# Patient Record
Sex: Female | Born: 1950
Health system: Southern US, Community
[De-identification: ages and names within clinical notes are randomized; demographics above are authoritative.]

## PROBLEM LIST (undated history)

## (undated) DIAGNOSIS — F329 Major depressive disorder, single episode, unspecified: Secondary | ICD-10-CM

## (undated) DIAGNOSIS — T7840XA Allergy, unspecified, initial encounter: Secondary | ICD-10-CM

## (undated) DIAGNOSIS — I7300 Raynaud's syndrome without gangrene: Secondary | ICD-10-CM

## (undated) DIAGNOSIS — M5136 Other intervertebral disc degeneration, lumbar region: Secondary | ICD-10-CM

## (undated) DIAGNOSIS — M8580 Other specified disorders of bone density and structure, unspecified site: Secondary | ICD-10-CM

## (undated) DIAGNOSIS — I10 Essential (primary) hypertension: Secondary | ICD-10-CM

## (undated) DIAGNOSIS — E785 Hyperlipidemia, unspecified: Secondary | ICD-10-CM

## (undated) DIAGNOSIS — F32A Depression, unspecified: Secondary | ICD-10-CM

## (undated) DIAGNOSIS — M51369 Other intervertebral disc degeneration, lumbar region without mention of lumbar back pain or lower extremity pain: Secondary | ICD-10-CM

## (undated) HISTORY — DX: Other intervertebral disc degeneration, lumbar region without mention of lumbar back pain or lower extremity pain: M51.369

## (undated) HISTORY — DX: Raynaud's syndrome without gangrene: I73.00

## (undated) HISTORY — DX: Depression, unspecified: F32.A

## (undated) HISTORY — DX: Allergy, unspecified, initial encounter: T78.40XA

## (undated) HISTORY — DX: Essential (primary) hypertension: I10

## (undated) HISTORY — DX: Other intervertebral disc degeneration, lumbar region: M51.36

## (undated) HISTORY — DX: Major depressive disorder, single episode, unspecified: F32.9

## (undated) HISTORY — DX: Hyperlipidemia, unspecified: E78.5

## (undated) HISTORY — PX: ABDOMINAL HYSTERECTOMY: SHX81

## (undated) HISTORY — PX: RHINOPLASTY: SUR1284

## (undated) HISTORY — DX: Other specified disorders of bone density and structure, unspecified site: M85.80

---

## 2000-07-20 ENCOUNTER — Emergency Department (HOSPITAL_COMMUNITY): Admission: EM | Admit: 2000-07-20 | Discharge: 2000-07-20 | Payer: Self-pay | Admitting: Emergency Medicine

## 2001-02-01 ENCOUNTER — Other Ambulatory Visit: Admission: RE | Admit: 2001-02-01 | Discharge: 2001-02-01 | Payer: Self-pay | Admitting: Obstetrics & Gynecology

## 2002-01-31 ENCOUNTER — Other Ambulatory Visit: Admission: RE | Admit: 2002-01-31 | Discharge: 2002-01-31 | Payer: Self-pay | Admitting: Internal Medicine

## 2004-05-13 ENCOUNTER — Other Ambulatory Visit: Admission: RE | Admit: 2004-05-13 | Discharge: 2004-05-13 | Payer: Self-pay | Admitting: Internal Medicine

## 2006-06-22 ENCOUNTER — Other Ambulatory Visit: Admission: RE | Admit: 2006-06-22 | Discharge: 2006-06-22 | Payer: Self-pay | Admitting: Internal Medicine

## 2008-06-05 DIAGNOSIS — I7300 Raynaud's syndrome without gangrene: Secondary | ICD-10-CM

## 2008-06-05 HISTORY — DX: Raynaud's syndrome without gangrene: I73.00

## 2008-08-28 ENCOUNTER — Ambulatory Visit (HOSPITAL_COMMUNITY): Admission: RE | Admit: 2008-08-28 | Discharge: 2008-08-28 | Payer: Self-pay | Admitting: Internal Medicine

## 2009-09-02 ENCOUNTER — Other Ambulatory Visit: Admission: RE | Admit: 2009-09-02 | Discharge: 2009-09-02 | Payer: Self-pay | Admitting: Internal Medicine

## 2010-09-29 ENCOUNTER — Other Ambulatory Visit: Payer: Self-pay | Admitting: Internal Medicine

## 2010-09-29 DIAGNOSIS — Z1231 Encounter for screening mammogram for malignant neoplasm of breast: Secondary | ICD-10-CM

## 2010-10-07 ENCOUNTER — Ambulatory Visit
Admission: RE | Admit: 2010-10-07 | Discharge: 2010-10-07 | Disposition: A | Discharge status: Home / Self Care | Payer: Commercial Indemnity | Source: Ambulatory Visit | Attending: Internal Medicine | Admitting: Internal Medicine

## 2010-10-07 DIAGNOSIS — Z1231 Encounter for screening mammogram for malignant neoplasm of breast: Secondary | ICD-10-CM

## 2011-09-29 ENCOUNTER — Other Ambulatory Visit: Payer: Self-pay | Admitting: Physician Assistant

## 2011-09-29 DIAGNOSIS — M8580 Other specified disorders of bone density and structure, unspecified site: Secondary | ICD-10-CM

## 2011-09-29 DIAGNOSIS — Z1231 Encounter for screening mammogram for malignant neoplasm of breast: Secondary | ICD-10-CM

## 2011-10-27 ENCOUNTER — Ambulatory Visit
Admission: RE | Admit: 2011-10-27 | Discharge: 2011-10-27 | Disposition: A | Discharge status: Home / Self Care | Payer: Commercial Indemnity | Source: Ambulatory Visit | Attending: Physician Assistant | Admitting: Physician Assistant

## 2011-10-27 DIAGNOSIS — M8580 Other specified disorders of bone density and structure, unspecified site: Secondary | ICD-10-CM

## 2011-10-27 DIAGNOSIS — Z1231 Encounter for screening mammogram for malignant neoplasm of breast: Secondary | ICD-10-CM

## 2012-10-11 ENCOUNTER — Other Ambulatory Visit (HOSPITAL_COMMUNITY)
Admission: RE | Admit: 2012-10-11 | Discharge: 2012-10-11 | Disposition: A | Discharge status: Home / Self Care | Payer: Commercial Indemnity | Source: Ambulatory Visit | Attending: Internal Medicine | Admitting: Internal Medicine

## 2012-10-11 DIAGNOSIS — Z01419 Encounter for gynecological examination (general) (routine) without abnormal findings: Secondary | ICD-10-CM | POA: Insufficient documentation

## 2012-10-11 DIAGNOSIS — N760 Acute vaginitis: Secondary | ICD-10-CM | POA: Insufficient documentation

## 2012-10-11 DIAGNOSIS — Z1151 Encounter for screening for human papillomavirus (HPV): Secondary | ICD-10-CM | POA: Insufficient documentation

## 2012-11-01 ENCOUNTER — Other Ambulatory Visit: Payer: Self-pay

## 2012-11-01 DIAGNOSIS — Z1231 Encounter for screening mammogram for malignant neoplasm of breast: Secondary | ICD-10-CM

## 2012-12-13 ENCOUNTER — Ambulatory Visit
Admission: RE | Admit: 2012-12-13 | Discharge: 2012-12-13 | Disposition: A | Discharge status: Home / Self Care | Payer: Managed Care, Other (non HMO) | Source: Ambulatory Visit

## 2012-12-13 DIAGNOSIS — Z1231 Encounter for screening mammogram for malignant neoplasm of breast: Secondary | ICD-10-CM

## 2012-12-16 ENCOUNTER — Other Ambulatory Visit: Payer: Self-pay | Admitting: Physician Assistant

## 2012-12-16 DIAGNOSIS — R928 Other abnormal and inconclusive findings on diagnostic imaging of breast: Secondary | ICD-10-CM

## 2012-12-26 ENCOUNTER — Ambulatory Visit
Admission: RE | Admit: 2012-12-26 | Discharge: 2012-12-26 | Disposition: A | Discharge status: Home / Self Care | Payer: Managed Care, Other (non HMO) | Source: Ambulatory Visit | Attending: Physician Assistant | Admitting: Physician Assistant

## 2012-12-26 DIAGNOSIS — R928 Other abnormal and inconclusive findings on diagnostic imaging of breast: Secondary | ICD-10-CM

## 2013-04-18 ENCOUNTER — Ambulatory Visit: Payer: Self-pay

## 2013-07-15 ENCOUNTER — Ambulatory Visit: Payer: Self-pay | Admitting: Emergency Medicine

## 2013-10-27 DIAGNOSIS — I7300 Raynaud's syndrome without gangrene: Secondary | ICD-10-CM | POA: Insufficient documentation

## 2013-10-27 DIAGNOSIS — F325 Major depressive disorder, single episode, in full remission: Secondary | ICD-10-CM | POA: Insufficient documentation

## 2013-10-27 DIAGNOSIS — M8580 Other specified disorders of bone density and structure, unspecified site: Secondary | ICD-10-CM | POA: Insufficient documentation

## 2013-10-27 DIAGNOSIS — I10 Essential (primary) hypertension: Secondary | ICD-10-CM | POA: Insufficient documentation

## 2013-10-27 DIAGNOSIS — M5136 Other intervertebral disc degeneration, lumbar region: Secondary | ICD-10-CM | POA: Insufficient documentation

## 2013-10-27 DIAGNOSIS — E782 Mixed hyperlipidemia: Secondary | ICD-10-CM | POA: Insufficient documentation

## 2013-10-27 DIAGNOSIS — T7840XA Allergy, unspecified, initial encounter: Secondary | ICD-10-CM | POA: Insufficient documentation

## 2013-10-31 ENCOUNTER — Encounter: Payer: Self-pay | Admitting: Physician Assistant

## 2013-10-31 ENCOUNTER — Ambulatory Visit (INDEPENDENT_AMBULATORY_CARE_PROVIDER_SITE_OTHER): Payer: Managed Care, Other (non HMO) | Admitting: Physician Assistant

## 2013-10-31 ENCOUNTER — Other Ambulatory Visit: Payer: Self-pay | Admitting: Physician Assistant

## 2013-10-31 VITALS — BP 110/70 | HR 64 | Temp 98.1°F | Resp 16 | Ht 61.0 in | Wt 122.0 lb

## 2013-10-31 DIAGNOSIS — Z0000 Encounter for general adult medical examination without abnormal findings: Secondary | ICD-10-CM

## 2013-10-31 DIAGNOSIS — E2839 Other primary ovarian failure: Secondary | ICD-10-CM

## 2013-10-31 DIAGNOSIS — N393 Stress incontinence (female) (male): Secondary | ICD-10-CM

## 2013-10-31 DIAGNOSIS — G5760 Lesion of plantar nerve, unspecified lower limb: Secondary | ICD-10-CM

## 2013-10-31 MED ORDER — AMLODIPINE BESYLATE 2.5 MG PO TABS: 2.5000 mg | ORAL_TABLET | Freq: Every day | ORAL | Status: DC

## 2013-10-31 MED ORDER — ESTRADIOL 1 MG PO TABS: 1.0000 mg | ORAL_TABLET | Freq: Every day | ORAL | Status: DC

## 2013-10-31 NOTE — Patient Instructions (Signed)
Morton's Neuroma in Sports  (Interdigital Plantar Neuroma) Morton's neuroma is a condition of the nervous system that results in pain or loss of feeling in the toes. The disease is caused by the bones of the foot squeezing the nerve that runs between two toes (interdigital nerve). The third and fourth toes are most likely to be affected by this disease. SYMPTOMS   Tingling, numbness, burning, or electric shocks in the front of the foot, often involving the third and fourth toes, although it may involve any other pair of toes.  Pain and tenderness in the front of the foot, that gets worse when walking.  Pain that gets worse when pressure is applied to the foot (wearing shoes).  Severe pain in the front of the foot, when standing on the front of the foot (on tiptoes), such as with running, jumping, pivoting, or dancing. CAUSES  Morton's neuroma is caused by swelling of the nerve between two toes. This swelling causes the nerve to be pinched between the bones of the foot. RISK INCREASES WITH:  Recurring foot or ankle injuries.  Poor fitting or worn shoes, with minimal padding and shock absorbers.  Loose ligaments of the foot, causing thickening of the nerve.  Poor foot strength and flexibility. PREVENTION  Warm up and stretch properly before activity.  Maintain physical fitness:  Foot and ankle flexibility.  Muscle strength and endurance.  Cardiovascular fitness.  Wear properly fitted and padded shoes.  Wear arch supports (orthotics), when needed. PROGNOSIS  If treated properly, Morton's neuroma can usually be cured with non-surgical treatment. For certain cases, surgery may be needed. RELATED COMPLICATIONS  Permanent numbness and pain in the foot.  Inability to participate in athletics, because of pain. TREATMENT Treatment first involves stopping any activities that make the symptoms worse. The use of ice and medicine will help reduce pain and inflammation. Wearing shoes  with a wide toe box, and an orthotic arch support or metatarsal bar, may also reduce pain. Your caregiver may give you a corticosteroid injection, to further reduce inflammation. If non-surgical treatment is unsuccessful, surgery may be needed. Surgery to fix Morton's neuroma is often performed as an outpatient procedure, meaning you can go home the same day as the surgery. The procedure involves removing the source of pressure on the nerve. If it is necessary to remove the nerve, you can expect persistent numbness. MEDICATION  If pain medicine is needed, nonsteroidal anti-inflammatory medicines (aspirin and ibuprofen), or other minor pain relievers (acetaminophen), are often advised.  Do not take pain medicine for 7 days before surgery.  Prescription pain relievers are usually prescribed only after surgery. Use only as directed and only as much as you need.  Corticosteroid injections are used in extreme cases, to reduce inflammation. These injections should be done only if necessary, because they may be given only a limited number of times. HEAT AND COLD  Cold treatment (icing) should be applied for 10 to 15 minutes every 2 to 3 hours for inflammation and pain, and immediately after activity that aggravates your symptoms. Use ice packs or an ice massage.  Heat treatment may be used before performing stretching and strengthening activities prescribed by your caregiver, physical therapist, or athletic trainer. Use a heat pack or a warm water soak. SEEK MEDICAL CARE IF:   Symptoms get worse or do not improve in 2 weeks, despite treatment.  After surgery you develop increasing pain, swelling, redness, increased warmth, bleeding, drainage of fluids, or fever.  New, unexplained symptoms develop. (  Drugs used in treatment may produce side effects.) Document Released: 03/29/2005 Document Revised: 08/14/2011 Document Reviewed: 09/03/2008 [facility] Patient Information 2014 [place], [place].

## 2013-10-31 NOTE — Progress Notes (Signed)
Complete Physical  Assessment and Plan: DEXA DUE COLONOSCOPY DUE Hyperlipidemia- - check lipids, decrease fatty foods, increase activity.   Hypertension-- continue medications, DASH diet, exercise and monitor at home. Call if greater than 130/80.   Depression- remission  Allergic rhinitis- Allegra OTC, increase H20, allergy hygiene explained.  Osteopenia- get dexa, continue Vit D and Ca, weight bearing exercises  Raynaud disease- continue Norvasc  DDD (degenerative disc disease), lumbar- controlled   Morton's neuroma- ice, wider shoes, rest, and if not better will refer to podiatry [facility]- vesicare caused dry mouth, would prefer non pharm therapy- will refer to urology for PT.   Discussed med's effects and SE's. Screening labs and tests as requested with regular follow-up as recommended.  HPI 63 y.o. female  presents for a complete physical. Her blood pressure has been controlled at home, today their BP is BP: 110/70 mmHg She does workout. She denies chest pain, shortness of breath, dizziness.  She is not on cholesterol medication and denies myalgias. Her cholesterol is at goal. The cholesterol last visit was:  LDL 88, HDL 100  Last A1C in the office was: 5.4 Patient is on Vitamin D supplement. 56.7 She states she has bilateral feet pain for past 5-6 months, burning/tingling pain, feel very tired right at the ball of her foot and it is intermittent, denies edema. Worse at night and with standing.  + stress incontinence does not want medication wants to try PT, daughter goes to urology on [facility] for PT.   Current Medications:  Current Outpatient Prescriptions on File Prior to Visit  Medication Sig Dispense Refill  . amLODipine (NORVASC) 2.5 MG tablet Take 2.5 mg by mouth daily.      Marland Kitchen BABY ASPIRIN PO Take 81 mg by mouth daily.      . Calcium Carbonate-Vitamin D (CALCIUM + D PO) Take 600 mg by mouth daily.       . Cholecalciferol (VITAMIN D PO) Take 4,000 Int'l Units by mouth 2 (two) times  daily.       Marland Kitchen estradiol (ESTRACE) 1 MG tablet Take 1 mg by mouth daily.       No current facility-administered medications on file prior to visit.   Health Maintenance:   Immunization History  Administered Date(s) Administered  . Pneumococcal-Unspecified 02/13/2003  . Tdap 09/29/2011   Tetanus: 2013 Pneumovax: 2004 with a reaction locally Flu vaccine: 2014 Zostavax: Will call insurance Pap: 08/2012 due 2017 MGM: 12/26/2012 then U/S left breast benign DEXA: May 2013 due May 2015 T -1.2 Osteopenia Colonoscopy: 12/31/2008 with Dr. Carlean Purl with repeat 5 years DUE THIS YEAR EGD: N/A EYE: Dr. Clydene Laming Yearly  Allergies:  Allergies  Allergen Reactions  . Codeine    Medical History:  Past Medical History  Diagnosis Date  . Hyperlipidemia   . Hypertension   . Depression   . Allergy   . Osteopenia   . Raynaud disease 2010  . DDD (degenerative disc disease), lumbar    Surgical History:  Past Surgical History  Procedure Laterality Date  . Abdominal hysterectomy    . Rhinoplasty     Family History:  Family History  Problem Relation Age of Onset  . Diabetes Mother   . Hypertension Mother   . Arthritis Mother   . Arthritis Father   . Cancer Father     colon  . Hypertension Father   . Macular degeneration Father    Social History:  History  Substance Use Topics  . Smoking status: Never Smoker   .  Smokeless tobacco: Not on file  . Alcohol Use: Yes     Comment: Rare    Review of Systems: [X]  = complains of  [ ]  = denies  General: Fatigue [ ]  Fever [ ]  Chills [ ]  Weakness [ ]   Insomnia [ ] Weight change [ ]  Night sweats [ ]   Change in appetite [ ]  Eyes: Redness [ ]  Blurred vision [ ]  Diplopia [ ]  Discharge [ ]   ENT: Congestion [ ]  Sinus Pain [ ]  Post Nasal Drip [ ]  Sore Throat [ ]  Earache [ ]  hearing loss [ ]  Tinnitus [ ]  Snoring [ ]   Cardiac: Chest pain/pressure [ ]  SOB [ ]  Orthopnea [ ]   Palpitations [ ]   Paroxysmal nocturnal dyspnea[ ]  Claudication [ ]  Edema [ ]    Pulmonary: Cough [ ]  Wheezing[ ]   SOB [ ]   Pleurisy [ ]   GI: Nausea [ ]  Vomiting[ ]  Dysphagia[ ]  Heartburn[ ]  Abdominal pain [ ]  Constipation [ ] ; Diarrhea [ ]  BRBPR [ ]  Melena[ ]  Bloating [ ]  Hemorrhoids [ ]   GU: Hematuria[ ]  Dysuria [ ]  Nocturia[ ]  Urgency [ ]   Hesitancy [ ]  Discharge [ ]  Frequency [ ]   Breast:  Breast lumps [ ]   nipple discharge [ ]    Neuro: Headaches[ ]  Vertigo[ ]  Paresthesias[X]  Spasm [ ]  Speech changes [ ]  Incoordination [ ]   Ortho: Arthritis [ ]  Joint pain [ ]  Muscle pain Valu.Nieves ] Joint swelling [ ]  Back Pain [ ]  Skin:  Rash [ ]   Pruritis [ ]  Change in skin lesion [ ]   Psych: Depression[ ]  Anxiety[ ]  Confusion [ ]  Memory loss [ ]   Heme/Lypmh: Bleeding [ ]  Bruising [ ]  Enlarged lymph nodes [ ]   Endocrine: Visual blurring [ ]  Paresthesia [ ]  Polyuria [ ]  Polydypsea [ ]    Heat/cold intolerance [ ]  Hypoglycemia [ ]   Physical Exam: Estimated body mass index is 23.06 kg/(m^2) as calculated from the following:   Height as of this encounter: 5\' 1"  (1.549 m).   Weight as of this encounter: 122 lb (55.339 kg). BP 110/70  Pulse 64  Temp(Src) 98.1 F (36.7 C)  Resp 16  Ht 5\' 1"  (1.549 m)  Wt 122 lb (55.339 kg)  BMI 23.06 kg/m2 General Appearance: Well nourished, in no apparent distress. Eyes: PERRLA, EOMs, conjunctiva no swelling or erythema, normal fundi and vessels. Sinuses: No Frontal/maxillary tenderness ENT/Mouth: Ext aud canals clear, normal light reflex with TMs without erythema, bulging.  Good dentition. No erythema, swelling, or exudate on post pharynx. Tonsils not swollen or erythematous. Hearing normal.  Neck: Supple, thyroid normal. No bruits Respiratory: Respiratory effort normal, BS equal bilaterally without rales, rhonchi, wheezing or stridor. Cardio: RRR without murmurs, rubs or gallops. Brisk peripheral pulses without edema.  Chest: symmetric, with normal excursions and percussion. Breasts: Symmetric, without lumps, nipple discharge, retractions. Abdomen:  Soft, +BS. Non tender, no guarding, rebound, hernias, masses, or organomegaly. .  Lymphatics: Non tender without lymphadenopathy.  Genitourinary: defer Musculoskeletal: Full ROM all peripheral extremities,5/5 strength, and normal gait. + Morton's neuroma on plantar of left foot proximal to MTPs 3 and 4  Skin: Warm, dry without rashes, lesions, ecchymosis.  Neuro: Cranial nerves intact, reflexes equal bilaterally. Normal muscle tone, no cerebellar symptoms. Sensation intact.  Psych: Awake and oriented X 3, normal affect, Insight and Judgment appropriate.    Vicie Mutters 9:41 AM

## 2013-11-17 ENCOUNTER — Other Ambulatory Visit: Payer: Self-pay

## 2013-11-17 DIAGNOSIS — Z1231 Encounter for screening mammogram for malignant neoplasm of breast: Secondary | ICD-10-CM

## 2013-12-04 LAB — MICROALBUMIN / CREATININE URINE RATIO: CREATININE UR: 30.5 mg/dL (ref 15.0–278.0)

## 2013-12-04 LAB — UA/M W/RFLX CULTURE, ROUTINE
BILIRUBIN UA: NEGATIVE
Glucose, UA: NEGATIVE
Ketones, UA: NEGATIVE
Leukocytes, UA: NEGATIVE
Nitrite, UA: NEGATIVE
PH UA: 6.5 (ref 5.0–7.5)
PROTEIN UA: NEGATIVE
RBC, UA: NEGATIVE
Specific Gravity, UA: 1.007 (ref 1.005–1.030)
UUROB: 0.2 mg/dL (ref 0.0–1.9)

## 2013-12-04 LAB — LIPID PANEL W/O CHOL/HDL RATIO
CHOLESTEROL TOTAL: 225 mg/dL — AB (ref 100–199)
HDL: 101 mg/dL (ref >39)
LDL CALC: 108 mg/dL — AB (ref 0–99)
TRIGLYCERIDES: 80 mg/dL (ref 0–149)
VLDL CHOLESTEROL CAL: 16 mg/dL (ref 5–40)

## 2013-12-04 LAB — CBC WITH DIFFERENTIAL
BASOS: 0 %
Basophils Absolute: 0 10*3/uL (ref 0.0–0.2)
EOS: 1 %
Eosinophils Absolute: 0.1 10*3/uL (ref 0.0–0.4)
HCT: 41 % (ref 34.0–46.6)
HEMOGLOBIN: 13.7 g/dL (ref 11.1–15.9)
IMMATURE GRANS (ABS): 0 10*3/uL (ref 0.0–0.1)
IMMATURE GRANULOCYTES: 0 %
LYMPHS ABS: 2.1 10*3/uL (ref 0.7–3.1)
Lymphs: 25 %
MCH: 31.4 pg (ref 26.6–33.0)
MCHC: 33.4 g/dL (ref 31.5–35.7)
MCV: 94 fL (ref 79–97)
MONOCYTES: 6 %
Monocytes Absolute: 0.5 10*3/uL (ref 0.1–0.9)
Neutrophils Absolute: 5.6 10*3/uL (ref 1.4–7.0)
Neutrophils Relative %: 68 %
PLATELETS: 294 10*3/uL (ref 150–379)
RBC: 4.37 x10E6/uL (ref 3.77–5.28)
RDW: 13.1 % (ref 12.3–15.4)
WBC: 8.3 10*3/uL (ref 3.4–10.8)

## 2013-12-04 LAB — HEPATIC FUNCTION PANEL
ALT: 17 IU/L (ref 0–32)
AST: 19 IU/L (ref 0–40)
Albumin: 4.3 g/dL (ref 3.6–4.8)
Alkaline Phosphatase: 96 IU/L (ref 39–117)
BILIRUBIN DIRECT: 0.11 mg/dL (ref 0.00–0.40)
Total Bilirubin: 0.3 mg/dL (ref 0.0–1.2)
Total Protein: 6.3 g/dL (ref 6.0–8.5)

## 2013-12-04 LAB — TSH: TSH: 0.949 u[IU]/mL (ref 0.450–4.500)

## 2013-12-04 LAB — VITAMIN D 25 HYDROXY (VIT D DEFICIENCY, FRACTURES): Vit D, 25-Hydroxy: 66.5 ng/mL (ref 30.0–100.0)

## 2013-12-04 LAB — BASIC METABOLIC PANEL
BUN/Creatinine Ratio: 17 (ref 11–26)
BUN: 18 mg/dL (ref 8–27)
CO2: 25 mmol/L (ref 18–29)
Calcium: 9.5 mg/dL (ref 8.7–10.3)
Chloride: 101 mmol/L (ref 97–108)
Creatinine, Ser: 1.03 mg/dL — ABNORMAL HIGH (ref 0.57–1.00)
GFR, EST AFRICAN AMERICAN: 67 mL/min/{1.73_m2} (ref >59)
GFR, EST NON AFRICAN AMERICAN: 58 mL/min/{1.73_m2} — AB (ref >59)
GLUCOSE: 95 mg/dL (ref 65–99)
Potassium: 4.2 mmol/L (ref 3.5–5.2)
Sodium: 139 mmol/L (ref 134–144)

## 2013-12-04 LAB — MICROSCOPIC EXAMINATION
Bacteria, UA: NONE SEEN
Epithelial Cells (non renal): NONE SEEN /hpf (ref 0–10)
RBC, UA: NONE SEEN /hpf (ref 0–?)
WBC, UA: NONE SEEN /hpf (ref 0–?)

## 2013-12-04 LAB — MAGNESIUM: MAGNESIUM: 2.2 mg/dL (ref 1.6–2.6)

## 2013-12-04 LAB — VITAMIN B12: Vitamin B-12: 618 pg/mL (ref 211–946)

## 2013-12-04 LAB — HGB A1C W/O EAG: HEMOGLOBIN A1C: 5.7 % — AB (ref 4.8–5.6)

## 2014-01-02 ENCOUNTER — Ambulatory Visit
Admission: RE | Admit: 2014-01-02 | Discharge: 2014-01-02 | Disposition: A | Discharge status: Home / Self Care | Payer: Managed Care, Other (non HMO) | Source: Ambulatory Visit

## 2014-01-02 DIAGNOSIS — Z1231 Encounter for screening mammogram for malignant neoplasm of breast: Secondary | ICD-10-CM

## 2014-05-18 ENCOUNTER — Other Ambulatory Visit: Payer: Self-pay | Admitting: Physician Assistant

## 2014-08-13 ENCOUNTER — Encounter: Payer: Self-pay | Admitting: Internal Medicine

## 2014-08-13 ENCOUNTER — Ambulatory Visit (INDEPENDENT_AMBULATORY_CARE_PROVIDER_SITE_OTHER): Payer: Managed Care, Other (non HMO) | Admitting: Internal Medicine

## 2014-08-13 VITALS — BP 128/70 | HR 60 | Temp 98.0°F | Resp 16 | Ht 61.0 in | Wt 118.0 lb

## 2014-08-13 DIAGNOSIS — J0140 Acute pansinusitis, unspecified: Secondary | ICD-10-CM

## 2014-08-13 MED ORDER — AZITHROMYCIN 250 MG PO TABS: ORAL_TABLET | ORAL | Status: AC

## 2014-08-13 MED ORDER — HYDROCODONE-ACETAMINOPHEN 5-325 MG PO TABS: 1.0000 | ORAL_TABLET | Freq: Four times a day (QID) | ORAL | Status: DC | PRN

## 2014-08-13 MED ORDER — PREDNISONE 20 MG PO TABS: ORAL_TABLET | ORAL | Status: DC

## 2014-08-13 MED ORDER — BENZONATATE 100 MG PO CAPS: 100.0000 mg | ORAL_CAPSULE | Freq: Four times a day (QID) | ORAL | Status: DC | PRN

## 2014-08-13 NOTE — Patient Instructions (Signed)

## 2014-08-13 NOTE — Progress Notes (Signed)
Patient ID: Alicia Gates, female   DOB: 1950-06-24, 64 y.o.   MRN: [id number]  HPI  Patient presents to the office for evaluation of cough.  It has been going on for 5 days.  Patient reports cough that is night > day, wet, barky, worse with lying down, no prior trial of bronchodilator therapy.  They also endorse change in voice, chills, fever, postnasal drip, shortness of breath, sputum production, wheezing and sinus pressure, ear pain.  They have tried antihistamines, upright position or nasal saline, flonase, mucinex, drinking plenty of fluids.  They report that nothing has worked.  They admits to other sick contacts at work.  Review of Systems  Constitutional: Positive for fever and chills. Negative for weight loss, malaise/fatigue and diaphoresis.  HENT: Positive for congestion, ear pain and sore throat. Negative for ear discharge, hearing loss, nosebleeds and tinnitus.   Eyes: Negative.   Respiratory: Positive for cough and sputum production. Negative for hemoptysis, shortness of breath, wheezing and stridor.   Cardiovascular: Negative for chest pain and leg swelling.  Gastrointestinal: Negative.   Genitourinary: Negative.   Musculoskeletal: Negative.   Skin: Negative.   Neurological: Positive for headaches.    PE: Filed Vitals:   08/13/14 0920  BP: 128/70  Pulse: 60  Temp: 98 F (36.7 C)  Resp: 16    General:  Alert and non-toxic, WDWN, NAD HEENT: NCAT, PERLA, EOM normal, no occular discharge or erythema.  Nasal mucosal edema with sinus tenderness to palpation.  Oropharynx clear with minimal oropharyngeal edema and erythema.  Mucous membranes moist and pink. Neck:  Cervical adenopathy Chest:  RRR no MRGs.  Lungs clear to auscultation A&P with no wheezes rhonchi or rales.   Abdomen: +BS x 4 quadrants, soft, non-tender, no guarding, rigidity, or rebound. Skin: warm and dry no rash Neuro: A&Ox4, CN II-XII grossly intact  Assessment and Plan:   1. Acute pansinusitis,  recurrence not specified Suspect sinusitis.  Will treat with zpak and steroids.  Tessalon given for cough.  Norco given at night time for severe cough.  1 refill given.   Patient to return for worsening symptoms.  Patient already has scheduled f/u for CPE.  Doubt PNA at this time.    - azithromycin (ZITHROMAX Z-PAK) 250 MG tablet; Take 2 tablets (500 mg) on  Day 1,  followed by 1 tablet (250 mg) once daily on Days 2 through 5.  Dispense: 6 each; Refill: 1 - predniSONE (DELTASONE) 20 MG tablet; 3 tabs po day one, then 2 tabs daily x 4 days  Dispense: 11 tablet; Refill: 0 - benzonatate (TESSALON PERLES) 100 MG capsule; Take 1 capsule (100 mg total) by mouth every 6 (six) hours as needed for cough.  Dispense: 30 capsule; Refill: 1 - HYDROcodone-acetaminophen (NORCO) 5-325 MG per tablet; Take 1 tablet by mouth every 6 (six) hours as needed (severe cough).  Dispense: 20 tablet; Refill: 0

## 2014-08-23 ENCOUNTER — Encounter: Payer: Self-pay | Admitting: *Deleted

## 2014-08-24 ENCOUNTER — Other Ambulatory Visit: Payer: Self-pay | Admitting: Internal Medicine

## 2014-08-24 ENCOUNTER — Other Ambulatory Visit: Payer: Self-pay | Admitting: *Deleted

## 2014-08-24 MED ORDER — AMLODIPINE BESYLATE 2.5 MG PO TABS: ORAL_TABLET | ORAL | Status: DC

## 2014-08-24 MED ORDER — ESTRADIOL 1 MG PO TABS: ORAL_TABLET | ORAL | Status: DC

## 2014-11-13 ENCOUNTER — Encounter: Payer: Self-pay | Admitting: Physician Assistant

## 2014-11-13 ENCOUNTER — Ambulatory Visit: Payer: Managed Care, Other (non HMO) | Admitting: Physician Assistant

## 2014-11-13 VITALS — BP 120/70 | HR 60 | Temp 97.7°F | Resp 16 | Ht 61.0 in | Wt 115.0 lb

## 2014-11-13 DIAGNOSIS — E559 Vitamin D deficiency, unspecified: Secondary | ICD-10-CM

## 2014-11-13 DIAGNOSIS — N393 Stress incontinence (female) (male): Secondary | ICD-10-CM | POA: Insufficient documentation

## 2014-11-13 DIAGNOSIS — F329 Major depressive disorder, single episode, unspecified: Secondary | ICD-10-CM

## 2014-11-13 DIAGNOSIS — R7303 Prediabetes: Secondary | ICD-10-CM

## 2014-11-13 DIAGNOSIS — F32A Depression, unspecified: Secondary | ICD-10-CM

## 2014-11-13 DIAGNOSIS — Z79899 Other long term (current) drug therapy: Secondary | ICD-10-CM

## 2014-11-13 DIAGNOSIS — M8580 Other specified disorders of bone density and structure, unspecified site: Secondary | ICD-10-CM

## 2014-11-13 DIAGNOSIS — E785 Hyperlipidemia, unspecified: Secondary | ICD-10-CM

## 2014-11-13 DIAGNOSIS — M5136 Other intervertebral disc degeneration, lumbar region: Secondary | ICD-10-CM

## 2014-11-13 DIAGNOSIS — I7300 Raynaud's syndrome without gangrene: Secondary | ICD-10-CM

## 2014-11-13 DIAGNOSIS — I10 Essential (primary) hypertension: Secondary | ICD-10-CM

## 2014-11-13 DIAGNOSIS — T7840XD Allergy, unspecified, subsequent encounter: Secondary | ICD-10-CM

## 2014-11-13 MED ORDER — AMLODIPINE BESYLATE 2.5 MG PO TABS: ORAL_TABLET | ORAL | Status: DC

## 2014-11-13 MED ORDER — ESTRADIOL 1 MG PO TABS: ORAL_TABLET | ORAL | Status: DC

## 2014-11-13 NOTE — Patient Instructions (Addendum)
Call insurance to ask about shingles vaccine price  Encourage you to get the 3D Mammogram  The 3D Mammogram is much more specific and sensitive to pick up breast cancer. For women with fibrocystic breast or lumpy breast it can be hard to determine if it is cancer or not but the 3D mammogram is able to tell this difference which cuts back on unneeded additional tests or scary call backs.    Add ENTERIC COATED low dose 81 mg Aspirin daily OR can do every other day if you have easy bruising to protect your heart and head. As well as to reduce risk of Colon Cancer by 20 %, Skin Cancer by 26 % , Melanoma by 46% and Pancreatic cancer by 60%  Preventive Care for Adults A healthy lifestyle and preventive care can promote health and wellness. Preventive health guidelines for women include the following key practices.  A routine yearly physical is a good way to check with your health care provider about your health and preventive screening. It is a chance to share any concerns and updates on your health and to receive a thorough exam.  Visit your dentist for a routine exam and preventive care every 6 months. Brush your teeth twice a day and floss once a day. Good oral hygiene prevents tooth decay and gum disease.  The frequency of eye exams is based on your age, health, family medical history, use of contact lenses, and other factors. Follow your health care provider's recommendations for frequency of eye exams.  Eat a healthy diet. Foods like vegetables, fruits, whole grains, low-fat dairy products, and lean protein foods contain the nutrients you need without too many calories. Decrease your intake of foods high in solid fats, added sugars, and salt. Eat the right amount of calories for you.Get information about a proper diet from your health care provider, if necessary.  Regular physical exercise is one of the most important things you can do for your health. Most adults should get at least 150 minutes  of moderate-intensity exercise (any activity that increases your heart rate and causes you to sweat) each week. In addition, most adults need muscle-strengthening exercises on 2 or more days a week.  Maintain a healthy weight. The body mass index (BMI) is a screening tool to identify possible weight problems. It provides an estimate of body fat based on height and weight. Your health care provider can find your BMI and can help you achieve or maintain a healthy weight.For adults 20 years and older:  A BMI below 18.5 is considered underweight.  A BMI of 18.5 to 24.9 is normal.  A BMI of 25 to 29.9 is considered overweight.  A BMI of 30 and above is considered obese.  Maintain normal blood lipids and cholesterol levels by exercising and minimizing your intake of saturated fat. Eat a balanced diet with plenty of fruit and vegetables. If your lipid or cholesterol levels are high, you are over 50, or you are at high risk for heart disease, you may need your cholesterol levels checked more frequently.Ongoing high lipid and cholesterol levels should be treated with medicines if diet and exercise are not working.  If you smoke, find out from your health care provider how to quit. If you do not use tobacco, do not start.  Lung cancer screening is recommended for adults aged 44-80 years who are at high risk for developing lung cancer because of a history of smoking. A yearly low-dose CT scan of the lungs  is recommended for people who have at least a 30-pack-year history of smoking and are a current smoker or have quit within the past 15 years. A pack year of smoking is smoking an average of 1 pack of cigarettes a day for 1 year (for example: 1 pack a day for 30 years or 2 packs a day for 15 years). Yearly screening should continue until the smoker has stopped smoking for at least 15 years. Yearly screening should be stopped for people who develop a health problem that would prevent them from having lung  cancer treatment.  Avoid use of street drugs. Do not share needles with anyone. Ask for help if you need support or instructions about stopping the use of drugs.  High blood pressure causes heart disease and increases the risk of stroke.  Ongoing high blood pressure should be treated with medicines if weight loss and exercise do not work.  If you are 35-54 years old, ask your health care provider if you should take aspirin to prevent strokes.  Diabetes screening involves taking a blood sample to check your fasting blood sugar level. This should be done once every 3 years, after age 84, if you are within normal weight and without risk factors for diabetes. Testing should be considered at a younger age or be carried out more frequently if you are overweight and have at least 1 risk factor for diabetes.  Breast cancer screening is essential preventive care for women. You should practice "breast self-awareness." This means understanding the normal appearance and feel of your breasts and may include breast self-examination. Any changes detected, no matter how small, should be reported to a health care provider. Women in their 38s and 30s should have a clinical breast exam (CBE) by a health care provider as part of a regular health exam every 1 to 3 years. After age 23, women should have a CBE every year. Starting at age 41, women should consider having a mammogram (breast X-ray test) every year. Women who have a family history of breast cancer should talk to their health care provider about genetic screening. Women at a high risk of breast cancer should talk to their health care providers about having an MRI and a mammogram every year.  Breast cancer gene (BRCA)-related cancer risk assessment is recommended for women who have family members with BRCA-related cancers. BRCA-related cancers include breast, ovarian, tubal, and peritoneal cancers. Having family members with these cancers may be associated with an  increased risk for harmful changes (mutations) in the breast cancer genes BRCA1 and BRCA2. Results of the assessment will determine the need for genetic counseling and BRCA1 and BRCA2 testing.  Routine pelvic exams to screen for cancer are no longer recommended for nonpregnant women who are considered low risk for cancer of the pelvic organs (ovaries, uterus, and vagina) and who do not have symptoms. Ask your health care provider if a screening pelvic exam is right for you.  If you have had past treatment for cervical cancer or a condition that could lead to cancer, you need Pap tests and screening for cancer for at least 20 years after your treatment. If Pap tests have been discontinued, your risk factors (such as having a new sexual partner) need to be reassessed to determine if screening should be resumed. Some women have medical problems that increase the chance of getting cervical cancer. In these cases, your health care provider may recommend more frequent screening and Pap tests.    Colorectal cancer  can be detected and often prevented. Most routine colorectal cancer screening begins at the age of 80 years and continues through age 70 years. However, your health care provider may recommend screening at an earlier age if you have risk factors for colon cancer. On a yearly basis, your health care provider may provide home test kits to check for hidden blood in the stool. Use of a small camera at the end of a tube, to directly examine the colon (sigmoidoscopy or colonoscopy), can detect the earliest forms of colorectal cancer. Talk to your health care provider about this at age 97, when routine screening begins. Direct exam of the colon should be repeated every 5-10 years through age 4 years, unless early forms of pre-cancerous polyps or small growths are found.  Osteoporosis is a disease in which the bones lose minerals and strength with aging. This can result in serious bone fractures or breaks.  The risk of osteoporosis can be identified using a bone density scan. Women ages 36 years and over and women at risk for fractures or osteoporosis should discuss screening with their health care providers. Ask your health care provider whether you should take a calcium supplement or vitamin D to reduce the rate of osteoporosis.  Menopause can be associated with physical symptoms and risks. Hormone replacement therapy is available to decrease symptoms and risks. You should talk to your health care provider about whether hormone replacement therapy is right for you.  Use sunscreen. Apply sunscreen liberally and repeatedly throughout the day. You should seek shade when your shadow is shorter than you. Protect yourself by wearing long sleeves, pants, a wide-brimmed hat, and sunglasses year round, whenever you are outdoors.  Once a month, do a whole body skin exam, using a mirror to look at the skin on your back. Tell your health care provider of new moles, moles that have irregular borders, moles that are larger than a pencil eraser, or moles that have changed in shape or color.  Stay current with required vaccines (immunizations).  Influenza vaccine. All adults should be immunized every year.  Tetanus, diphtheria, and acellular pertussis (Td, Tdap) vaccine. Pregnant women should receive 1 dose of Tdap vaccine during each pregnancy. The dose should be obtained regardless of the length of time since the last dose. Immunization is preferred during the 27th-36th week of gestation. An adult who has not previously received Tdap or who does not know her vaccine status should receive 1 dose of Tdap. This initial dose should be followed by tetanus and diphtheria toxoids (Td) booster doses every 10 years. Adults with an unknown or incomplete history of completing a 3-dose immunization series with Td-containing vaccines should begin or complete a primary immunization series including a Tdap dose. Adults should receive  a Td booster every 10 years.    Zoster vaccine. One dose is recommended for adults aged 70 years or older unless certain conditions are present.    Pneumococcal 13-valent conjugate (PCV13) vaccine. When indicated, a person who is uncertain of her immunization history and has no record of immunization should receive the PCV13 vaccine. An adult aged 43 years or older who has certain medical conditions and has not been previously immunized should receive 1 dose of PCV13 vaccine. This PCV13 should be followed with a dose of pneumococcal polysaccharide (PPSV23) vaccine. The PPSV23 vaccine dose should be obtained at least 8 weeks after the dose of PCV13 vaccine. An adult aged 55 years or older who has certain medical conditions and previously  received 1 or more doses of PPSV23 vaccine should receive 1 dose of PCV13. The PCV13 vaccine dose should be obtained 1 or more years after the last PPSV23 vaccine dose.    Pneumococcal polysaccharide (PPSV23) vaccine. When PCV13 is also indicated, PCV13 should be obtained first. All adults aged 32 years and older should be immunized. An adult younger than age 35 years who has certain medical conditions should be immunized. Any person who resides in a nursing home or long-term care facility should be immunized. An adult smoker should be immunized. People with an immunocompromised condition and certain other conditions should receive both PCV13 and PPSV23 vaccines. People with human immunodeficiency virus (HIV) infection should be immunized as soon as possible after diagnosis. Immunization during chemotherapy or radiation therapy should be avoided. Routine use of PPSV23 vaccine is not recommended for American Indians, [place] Natives, or people younger than 65 years unless there are medical conditions that require PPSV23 vaccine. When indicated, people who have unknown immunization and have no record of immunization should receive PPSV23 vaccine. One-time revaccination 5  years after the first dose of PPSV23 is recommended for people aged 19-64 years who have chronic kidney failure, nephrotic syndrome, asplenia, or immunocompromised conditions. People who received 1-2 doses of PPSV23 before age 30 years should receive another dose of PPSV23 vaccine at age 54 years or later if at least 5 years have passed since the previous dose. Doses of PPSV23 are not needed for people immunized with PPSV23 at or after age 79 years.   Preventive Services / Frequency  Ages 22 years and over  Blood pressure check.  Lipid and cholesterol check.  Lung cancer screening. / Every year if you are aged 51-80 years and have a 30-pack-year history of smoking and currently smoke or have quit within the past 15 years. Yearly screening is stopped once you have quit smoking for at least 15 years or develop a health problem that would prevent you from having lung cancer treatment.  Clinical breast exam.** / Every year after age 59 years.  BRCA-related cancer risk assessment.** / For women who have family members with a BRCA-related cancer (breast, ovarian, tubal, or peritoneal cancers).  Mammogram.** / Every year beginning at age 38 years and continuing for as long as you are in good health. Consult with your health care provider.  Pap test.** / Every 3 years starting at age 25 years through age 95 or 46 years with 3 consecutive normal Pap tests. Testing can be stopped between 65 and 70 years with 3 consecutive normal Pap tests and no abnormal Pap or HPV tests in the past 10 years.  Fecal occult blood test (FOBT) of stool. / Every year beginning at age 72 years and continuing until age 24 years. You may not need to do this test if you get a colonoscopy every 10 years.  Flexible sigmoidoscopy or colonoscopy.** / Every 5 years for a flexible sigmoidoscopy or every 10 years for a colonoscopy beginning at age 30 years and continuing until age 40 years.  Hepatitis C blood test.** / For all  people born from [birth year] through 1965 and any individual with known risks for hepatitis C.  Osteoporosis screening.** / A one-time screening for women ages 75 years and over and women at risk for fractures or osteoporosis.  Skin self-exam. / Monthly.  Influenza vaccine. / Every year.  Tetanus, diphtheria, and acellular pertussis (Tdap/Td) vaccine.** / 1 dose of Td every 10 years.  Zoster vaccine.** / 1 dose  for adults aged 26 years or older.  Pneumococcal 13-valent conjugate (PCV13) vaccine.** / Consult your health care provider.  Pneumococcal polysaccharide (PPSV23) vaccine.** / 1 dose for all adults aged 32 years and older. Screening for abdominal aortic aneurysm (AAA)  by ultrasound is recommended for people who have history of high blood pressure or who are current or former smokers.

## 2014-11-13 NOTE — Progress Notes (Signed)
Complete Physical  Assessment and Plan: 1. Essential hypertension - continue medications, DASH diet, exercise and monitor at home. Call if greater than 130/80.  - CBC with Differential/Platelet - BASIC METABOLIC PANEL WITH GFR - Hepatic function panel - TSH - Urinalysis, Routine w reflex microscopic (not at [facility]) - Microalbumin / creatinine urine ratio  2. Hyperlipidemia -continue medications, check lipids, decrease fatty foods, increase activity. - Lipid panel  3. Depression remission  4. Osteopenia Will get at [facility]  get dexa, continue Vit D and Ca, weight bearing exercises - DG Bone Density; Future  5. Raynaud disease Refill norvasc  6. DDD (degenerative disc disease), lumbar RICE, NSAIDS, exercises given, if not better get xray and PT referral or ortho referral.   7. Allergy, subsequent encounter - Allegra OTC, increase H20, allergy hygiene explained.  8. Stress incontinence Doing better, can do PT if patient wishes but declines at this time  9. Prediabetes Discussed general issues about diabetes pathophysiology and management., Educational material distributed., Suggested low cholesterol diet., Encouraged aerobic exercise., Discussed foot care., Reminded to get yearly retinal exam. - Hemoglobin A1c - Insulin, fasting  10. Medication management - Magnesium  11. Vitamin D deficiency - Vit D  25 hydroxy (rtn osteoporosis monitoring)  GOES TO [facility] FOR LABS Discussed med's effects and SE's. Screening labs and tests as requested with regular follow-up as recommended.  HPI 64 y.o. female  presents for a complete physical. Her blood pressure has been controlled at home, today their BP is BP: 120/70 mmHg She does workout. She denies chest pain, shortness of breath, dizziness.  She is not on cholesterol medication and denies myalgias. Her cholesterol is at goal. The cholesterol last visit was:  Lab Results  Component Value Date   CHOL 225* 10/31/2013   HDL 101  10/31/2013   LDLCALC 108* 10/31/2013   TRIG 80 10/31/2013    Last A1C in the office was:  Lab Results  Component Value Date   HGBA1C 5.7* 10/31/2013   Patient is on Vitamin D supplement.  Lab Results  Component Value Date   VD25OH 66.5 10/31/2013   Morton's neuroma is better.  + stress incontinence, did not do PT, but has been doing at home kegels. She states it is better, only an issue with jumping jacks/sneezing.  She is on estrogen but has had hysterectomy, does not smoke, exercises, and is on bASA.  Goes to Dr. Allyson Sabal once a year.   Current Medications:  Current Outpatient Prescriptions on File Prior to Visit  Medication Sig Dispense Refill  . amLODipine (NORVASC) 2.5 MG tablet TAKE 1 TABLET (2.5 MG TOTAL) BY MOUTH DAILY. 90 tablet 1  . BABY ASPIRIN PO Take 81 mg by mouth daily.    Marland Kitchen BIOTIN PO Take 10,000 mg by mouth daily.    . Calcium Carbonate-Vitamin D (CALCIUM + D PO) Take 600 mg by mouth daily.     . Cholecalciferol (VITAMIN D PO) Take 4,000 Int'l Units by mouth 2 (two) times daily.     Marland Kitchen estradiol (ESTRACE) 1 MG tablet TAKE 1 TABLET (1 MG TOTAL) BY MOUTH DAILY. 90 tablet 1  . Multiple Vitamins-Minerals (MULTIVITAMIN PO) Take by mouth. With calcium    . vitamin C (ASCORBIC ACID) 500 MG tablet Take 500 mg by mouth daily.     No current facility-administered medications on file prior to visit.   Health Maintenance:   Immunization History  Administered Date(s) Administered  . Pneumococcal-Unspecified 02/13/2003  . Tdap 09/29/2011  Tetanus: 2013 Pneumovax: 2004 with a reaction locally Prevnar 13: due at 30 Flu vaccine: 2015 Zostavax: Will call insurance Pap: 08/2012 due 2017 MGM: 01/02/2014 hx of U/S left breast benign DEXA: May 2013 due May 2015 T -1.2 Osteopenia DUE, on estrace Colonoscopy: 07/2014, 2 polyps removed, Dr. Sundra Aland EGD: N/A CXR 08/2008 EYE: Dr. Clydene Laming Yearly Feb 2016 DENSTIST: Dr. Noah Charon  Allergies:  Allergies  Allergen Reactions   . Codeine    Medical History:  Past Medical History  Diagnosis Date  . Hyperlipidemia   . Hypertension   . Depression   . Allergy   . Osteopenia   . Raynaud disease 2010  . DDD (degenerative disc disease), lumbar    Surgical History:  Past Surgical History  Procedure Laterality Date  . Abdominal hysterectomy    . Rhinoplasty     Family History:  Family History  Problem Relation Age of Onset  . Diabetes Mother   . Hypertension Mother   . Arthritis Mother   . Arthritis Father   . Cancer Father     colon  . Hypertension Father   . Macular degeneration Father    Social History:  History  Substance Use Topics  . Smoking status: Never Smoker   . Smokeless tobacco: Not on file  . Alcohol Use: Yes     Comment: Rare   Review of Systems  Constitutional: Negative.   HENT: Negative.   Eyes: Negative.   Respiratory: Negative.   Cardiovascular: Negative.   Gastrointestinal: Negative.   Genitourinary: Negative.   Musculoskeletal: Negative.   Skin: Negative.   Neurological: Negative.   Endo/Heme/Allergies: Negative.   Psychiatric/Behavioral: Negative.     Physical Exam: Estimated body mass index is 21.74 kg/(m^2) as calculated from the following:   Height as of this encounter: 5\' 1"  (1.549 m).   Weight as of this encounter: 115 lb (52.164 kg). BP 120/70 mmHg  Pulse 60  Temp(Src) 97.7 F (36.5 C)  Resp 16  Ht 5\' 1"  (1.549 m)  Wt 115 lb (52.164 kg)  BMI 21.74 kg/m2 General Appearance: Well nourished, in no apparent distress. Eyes: PERRLA, EOMs, conjunctiva no swelling or erythema, normal fundi and vessels. Sinuses: No Frontal/maxillary tenderness ENT/Mouth: Ext aud canals clear, normal light reflex with TMs without erythema, bulging.  Good dentition. No erythema, swelling, or exudate on post pharynx. Tonsils not swollen or erythematous. Hearing normal.  Neck: Supple, thyroid normal. No bruits Respiratory: Respiratory effort normal, BS equal bilaterally without  rales, rhonchi, wheezing or stridor. Cardio: RRR without murmurs, rubs or gallops. Brisk peripheral pulses without edema.  Chest: symmetric, with normal excursions and percussion. Breasts: Symmetric, without lumps, nipple discharge, retractions. Abdomen: Soft, +BS. Non tender, no guarding, rebound, hernias, masses, or organomegaly. .  Lymphatics: Non tender without lymphadenopathy.  Genitourinary: defer Musculoskeletal: Full ROM all peripheral extremities,5/5 strength, and normal gait. + Morton's neuroma on plantar of left foot proximal to MTPs 3 and 4  Skin: Warm, dry without rashes, lesions, ecchymosis.  Neuro: Cranial nerves intact, reflexes equal bilaterally. Normal muscle tone, no cerebellar symptoms. Sensation intact.  Psych: Awake and oriented X 3, normal affect, Insight and Judgment appropriate.   EKG defer Aorta scan Defer  Vicie Mutters 9:48 AM

## 2014-11-16 ENCOUNTER — Other Ambulatory Visit: Payer: Self-pay | Admitting: Physician Assistant

## 2014-11-16 LAB — CBC WITH DIFFERENTIAL/PLATELET
BASOS ABS: 0 10*3/uL (ref 0.0–0.2)
Basos: 1 %
EOS (ABSOLUTE): 0.2 10*3/uL (ref 0.0–0.4)
Eos: 4 %
HEMATOCRIT: 41.8 % (ref 34.0–46.6)
Hemoglobin: 14 g/dL (ref 11.1–15.9)
IMMATURE GRANULOCYTES: 0 %
Immature Grans (Abs): 0 10*3/uL (ref 0.0–0.1)
Lymphocytes Absolute: 2.6 10*3/uL (ref 0.7–3.1)
Lymphs: 44 %
MCH: 31.6 pg (ref 26.6–33.0)
MCHC: 33.5 g/dL (ref 31.5–35.7)
MCV: 94 fL (ref 79–97)
MONOS ABS: 0.5 10*3/uL (ref 0.1–0.9)
Monocytes: 9 %
Neutrophils Absolute: 2.6 10*3/uL (ref 1.4–7.0)
Neutrophils: 42 %
PLATELETS: 269 10*3/uL (ref 150–379)
RBC: 4.43 x10E6/uL (ref 3.77–5.28)
RDW: 12.8 % (ref 12.3–15.4)
WBC: 6 10*3/uL (ref 3.4–10.8)

## 2014-11-17 LAB — LIPID PANEL W/O CHOL/HDL RATIO
Cholesterol, Total: 216 mg/dL — ABNORMAL HIGH (ref 100–199)
HDL: 116 mg/dL
LDL Calculated: 81 mg/dL (ref 0–99)
Triglycerides: 93 mg/dL (ref 0–149)
VLDL Cholesterol Cal: 19 mg/dL (ref 5–40)

## 2014-11-17 LAB — BASIC METABOLIC PANEL WITH GFR
BUN/Creatinine Ratio: 21 (ref 11–26)
BUN: 17 mg/dL (ref 8–27)
CO2: 25 mmol/L (ref 18–29)
Calcium: 9.4 mg/dL (ref 8.7–10.3)
Chloride: 100 mmol/L (ref 97–108)
Creatinine, Ser: 0.8 mg/dL (ref 0.57–1.00)
GFR calc Af Amer: 91 mL/min/{1.73_m2}
GFR calc non Af Amer: 79 mL/min/{1.73_m2}
Glucose: 101 mg/dL — ABNORMAL HIGH (ref 65–99)
Potassium: 5.7 mmol/L — ABNORMAL HIGH (ref 3.5–5.2)
Sodium: 138 mmol/L (ref 134–144)

## 2014-11-17 LAB — HGB A1C W/O EAG: Hgb A1c MFr Bld: 5.9 % — ABNORMAL HIGH (ref 4.8–5.6)

## 2014-11-17 LAB — MAGNESIUM: Magnesium: 2.1 mg/dL (ref 1.6–2.3)

## 2014-11-17 LAB — HEPATIC FUNCTION PANEL
ALT: 17 [IU]/L (ref 0–32)
AST: 17 [IU]/L (ref 0–40)
Albumin: 4.2 g/dL (ref 3.6–4.8)
Alkaline Phosphatase: 116 [IU]/L (ref 39–117)
Bilirubin Total: 0.4 mg/dL (ref 0.0–1.2)
Bilirubin, Direct: 0.11 mg/dL (ref 0.00–0.40)
Total Protein: 6.2 g/dL (ref 6.0–8.5)

## 2014-11-17 LAB — TSH: TSH: 1.98 u[IU]/mL (ref 0.450–4.500)

## 2014-11-17 LAB — VITAMIN D 25 HYDROXY (VIT D DEFICIENCY, FRACTURES): Vit D, 25-Hydroxy: 61.4 ng/mL (ref 30.0–100.0)

## 2014-11-23 ENCOUNTER — Other Ambulatory Visit: Payer: Self-pay

## 2014-11-23 DIAGNOSIS — Z1231 Encounter for screening mammogram for malignant neoplasm of breast: Secondary | ICD-10-CM

## 2015-01-11 ENCOUNTER — Encounter: Payer: Self-pay | Admitting: Internal Medicine

## 2015-01-11 ENCOUNTER — Ambulatory Visit (INDEPENDENT_AMBULATORY_CARE_PROVIDER_SITE_OTHER): Payer: Managed Care, Other (non HMO) | Admitting: Internal Medicine

## 2015-01-11 VITALS — BP 122/80 | HR 86 | Temp 98.2°F | Resp 18 | Ht 61.0 in | Wt 115.0 lb

## 2015-01-11 DIAGNOSIS — J0191 Acute recurrent sinusitis, unspecified: Secondary | ICD-10-CM | POA: Diagnosis not present

## 2015-01-11 MED ORDER — ALPRAZOLAM 0.5 MG PO TABS: 0.5000 mg | ORAL_TABLET | Freq: Three times a day (TID) | ORAL | Status: DC | PRN

## 2015-01-11 MED ORDER — PREDNISONE 20 MG PO TABS
ORAL_TABLET | ORAL | Status: DC
Start: 2015-01-11 — End: 2015-07-29

## 2015-01-11 MED ORDER — AZITHROMYCIN 250 MG PO TABS: ORAL_TABLET | ORAL | Status: DC

## 2015-01-11 NOTE — Progress Notes (Signed)
Patient ID: Alicia Gates, female   DOB: Jul 08, 1950, 63 y.o.   MRN: [id number]  HPI  Patient presents to the office for evaluation of cough.  It has been going on for 1 weeks.  Patient reports wet, thick white yellow sputum which is the same as what she is blowing out of her nose.  They also endorse chills, fever, postnasal drip, sputum production and nasal congestion, sinus pressure, left ear pain, and sore throat..  They have tried antihistamines or flonase.  They report that nothing has worked.  They denies other sick contacts.  She does admit to some seasonal allergies.  She does have frequent sinus infections.    Patient reports that she is due to travel overnight to [place].  She is concerned that she won't sleep well on the plane.  She is asking for help with a sleep medication or anxiety medication.    ROS  PE:  Filed Vitals:   01/11/15 1119  BP: 122/80  Pulse: 86  Temp: 98.2 F (36.8 C)  Resp: 18    General:  Alert and non-toxic, WDWN, NAD HEENT: NCAT, PERLA, EOM normal, no occular discharge or erythema.  Nasal mucosal edema with sinus tenderness to palpation.  Oropharynx clear with minimal oropharyngeal edema and erythema.  Mucous membranes moist and pink. Neck:  Cervical adenopathy Chest:  RRR no MRGs.  Lungs clear to auscultation A&P with no wheezes rhonchi or rales.   Abdomen: +BS x 4 quadrants, soft, non-tender, no guarding, rigidity, or rebound. Skin: warm and dry no rash Neuro: A&Ox4, CN II-XII grossly intact  Assessment and Plan:   1. Acute recurrent sinusitis, unspecified location -prednisone -zpak -nasal saline -mucinex -flonase -zyrtec  2.  Situational anxiety -limited quantity of xanax for overseas travel

## 2015-01-11 NOTE — Patient Instructions (Signed)
Alprazolam tablets What is this medicine? ALPRAZOLAM (al PRAY zoe lam) is a benzodiazepine. It is used to treat anxiety and panic attacks. This medicine may be used for other purposes; ask your health care provider or pharmacist if you have questions. COMMON BRAND NAME(S): Xanax What should I tell my health care provider before I take this medicine? They need to know if you have any of these conditions: -an alcohol or drug abuse problem -bipolar disorder, depression, psychosis or other mental health conditions -glaucoma -kidney or liver disease -lung or breathing disease -myasthenia gravis -Parkinson's disease -porphyria -seizures or a history of seizures -suicidal thoughts -an unusual or allergic reaction to alprazolam, other benzodiazepines, foods, dyes, or preservatives -pregnant or trying to get pregnant -breast-feeding How should I use this medicine? Take this medicine by mouth with a glass of water. Follow the directions on the prescription label. Take your medicine at regular intervals. Do not take it more often than directed. If you have been taking this medicine regularly for some time, do not suddenly stop taking it. You must gradually reduce the dose or you may get severe side effects. Ask your doctor or health care professional for advice. Even after you stop taking this medicine it can still affect your body for several days. Talk to your pediatrician regarding the use of this medicine in children. Special care may be needed. Overdosage: If you think you have taken too much of this medicine contact a poison control center or emergency room at once. NOTE: This medicine is only for you. Do not share this medicine with others. What if I miss a dose? If you miss a dose, take it as soon as you can. If it is almost time for your next dose, take only that dose. Do not take double or extra doses. What may interact with this medicine? Do not take this medicine with any of the  following medications: -certain medicines for HIV infection or AIDS -ketoconazole -itraconazole This medicine may also interact with the following medications: -birth control pills -certain macrolide antibiotics like clarithromycin, erythromycin, troleandomycin -cimetidine -cyclosporine -ergotamine -grapefruit juice -herbal or dietary supplements like kava kava, melatonin, dehydroepiandrosterone, DHEA, St. John's Wort or valerian -imatinib, STI-571 -isoniazid -levodopa -medicines for depression, anxiety, or psychotic disturbances -prescription pain medicines -rifampin, rifapentine, or rifabutin -some medicines for blood pressure or heart problems -some medicines for seizures like carbamazepine, oxcarbazepine, phenobarbital, phenytoin, primidone This list may not describe all possible interactions. Give your health care provider a list of all the medicines, herbs, non-prescription drugs, or dietary supplements you use. Also tell them if you smoke, drink alcohol, or use illegal drugs. Some items may interact with your medicine. What should I watch for while using this medicine? Visit your doctor or health care professional for regular checks on your progress. Your body can become dependent on this medicine. Ask your doctor or health care professional if you still need to take it. You may get drowsy or dizzy. Do not drive, use machinery, or do anything that needs mental alertness until you know how this medicine affects you. To reduce the risk of dizzy and fainting spells, do not stand or sit up quickly, especially if you are an older patient. Alcohol may increase dizziness and drowsiness. Avoid alcoholic drinks. Do not treat yourself for coughs, colds or allergies without asking your doctor or health care professional for advice. Some ingredients can increase possible side effects. What side effects may I notice from receiving this medicine? Side effects that you  should report to your doctor  or health care professional as soon as possible: -allergic reactions like skin rash, itching or hives, swelling of the face, lips, or tongue -confusion, forgetfulness -depression -difficulty sleeping -difficulty speaking -feeling faint or lightheaded, falls -mood changes, excitability or aggressive behavior -muscle cramps -trouble passing urine or change in the amount of urine -unusually weak or tired Side effects that usually do not require medical attention (report to your doctor or health care professional if they continue or are bothersome): -change in sex drive or performance -changes in appetite This list may not describe all possible side effects. Call your doctor for medical advice about side effects. You may report side effects to FDA at 1-800-FDA-1088. Where should I keep my medicine? Keep out of the reach of children. This medicine can be abused. Keep your medicine in a safe place to protect it from theft. Do not share this medicine with anyone. Selling or giving away this medicine is dangerous and against the law. Store at room temperature between 20 and 25 degrees C (68 and 77 degrees F). Throw away any unused medicine after the expiration date. NOTE: This sheet is a summary. It may not cover all possible information. If you have questions about this medicine, talk to your doctor, pharmacist, or health care provider.  2015, Elsevier/Gold Standard. (2007-08-15 10:34:46)  Sinusitis Sinusitis is redness, soreness, and inflammation of the paranasal sinuses. Paranasal sinuses are air pockets within the bones of your face (beneath the eyes, the middle of the forehead, or above the eyes). In healthy paranasal sinuses, mucus is able to drain out, and air is able to circulate through them by way of your nose. However, when your paranasal sinuses are inflamed, mucus and air can become trapped. This can allow bacteria and other germs to grow and cause infection. Sinusitis can develop  quickly and last only a short time (acute) or continue over a long period (chronic). Sinusitis that lasts for more than 12 weeks is considered chronic.  CAUSES  Causes of sinusitis include:  Allergies.  Structural abnormalities, such as displacement of the cartilage that separates your nostrils (deviated septum), which can decrease the air flow through your nose and sinuses and affect sinus drainage.  Functional abnormalities, such as when the small hairs (cilia) that line your sinuses and help remove mucus do not work properly or are not present. SIGNS AND SYMPTOMS  Symptoms of acute and chronic sinusitis are the same. The primary symptoms are pain and pressure around the affected sinuses. Other symptoms include:  Upper toothache.  Earache.  Headache.  Bad breath.  Decreased sense of smell and taste.  A cough, which worsens when you are lying flat.  Fatigue.  Fever.  Thick drainage from your nose, which often is green and may contain pus (purulent).  Swelling and warmth over the affected sinuses. DIAGNOSIS  Your health care provider will perform a physical exam. During the exam, your health care provider may:  Look in your nose for signs of abnormal growths in your nostrils (nasal polyps).  Tap over the affected sinus to check for signs of infection.  View the inside of your sinuses (endoscopy) using an imaging device that has a light attached (endoscope). If your health care provider suspects that you have chronic sinusitis, one or more of the following tests may be recommended:  Allergy tests.  Nasal culture. A sample of mucus is taken from your nose, sent to a lab, and screened for bacteria.  Nasal cytology.  A sample of mucus is taken from your nose and examined by your health care provider to determine if your sinusitis is related to an allergy. TREATMENT  Most cases of acute sinusitis are related to a viral infection and will resolve on their own within 10 days.  Sometimes medicines are prescribed to help relieve symptoms (pain medicine, decongestants, nasal steroid sprays, or saline sprays).  However, for sinusitis related to a bacterial infection, your health care provider will prescribe antibiotic medicines. These are medicines that will help kill the bacteria causing the infection.  Rarely, sinusitis is caused by a fungal infection. In theses cases, your health care provider will prescribe antifungal medicine. For some cases of chronic sinusitis, surgery is needed. Generally, these are cases in which sinusitis recurs more than 3 times per year, despite other treatments. HOME CARE INSTRUCTIONS   Drink plenty of water. Water helps thin the mucus so your sinuses can drain more easily.  Use a humidifier.  Inhale steam 3 to 4 times a day (for example, sit in the bathroom with the shower running).  Apply a warm, moist washcloth to your face 3 to 4 times a day, or as directed by your health care provider.  Use saline nasal sprays to help moisten and clean your sinuses.  Take medicines only as directed by your health care provider.  If you were prescribed either an antibiotic or antifungal medicine, finish it all even if you start to feel better. SEEK IMMEDIATE MEDICAL CARE IF:  You have increasing pain or severe headaches.  You have nausea, vomiting, or drowsiness.  You have swelling around your face.  You have vision problems.  You have a stiff neck.  You have difficulty breathing. MAKE SURE YOU:   Understand these instructions.  Will watch your condition.  Will get help right away if you are not doing well or get worse. Document Released: 05/22/2005 Document Revised: 10/06/2013 Document Reviewed: 06/06/2011 [facility] Patient Information 2015 [place], [place]. This information is not intended to replace advice given to you by your health care provider. Make sure you discuss any questions you have with your health care provider.

## 2015-01-21 ENCOUNTER — Other Ambulatory Visit: Payer: Managed Care, Other (non HMO)

## 2015-01-21 ENCOUNTER — Ambulatory Visit: Payer: Managed Care, Other (non HMO)

## 2015-01-22 ENCOUNTER — Ambulatory Visit
Admission: RE | Admit: 2015-01-22 | Discharge: 2015-01-22 | Disposition: A | Discharge status: Home / Self Care | Payer: Managed Care, Other (non HMO) | Source: Ambulatory Visit | Attending: Physician Assistant | Admitting: Physician Assistant

## 2015-01-22 ENCOUNTER — Ambulatory Visit
Admission: RE | Admit: 2015-01-22 | Discharge: 2015-01-22 | Disposition: A | Discharge status: Home / Self Care | Payer: Managed Care, Other (non HMO) | Source: Ambulatory Visit

## 2015-01-22 DIAGNOSIS — M8580 Other specified disorders of bone density and structure, unspecified site: Secondary | ICD-10-CM

## 2015-01-22 DIAGNOSIS — Z1231 Encounter for screening mammogram for malignant neoplasm of breast: Secondary | ICD-10-CM

## 2015-07-29 ENCOUNTER — Ambulatory Visit (INDEPENDENT_AMBULATORY_CARE_PROVIDER_SITE_OTHER): Payer: Managed Care, Other (non HMO) | Admitting: Internal Medicine

## 2015-07-29 ENCOUNTER — Encounter: Payer: Self-pay | Admitting: Internal Medicine

## 2015-07-29 VITALS — BP 102/60 | HR 70 | Temp 97.8°F | Resp 18 | Ht 61.0 in | Wt 123.0 lb

## 2015-07-29 DIAGNOSIS — J069 Acute upper respiratory infection, unspecified: Secondary | ICD-10-CM | POA: Diagnosis not present

## 2015-07-29 MED ORDER — AZELASTINE HCL 0.1 % NA SOLN: 2.0000 | Freq: Two times a day (BID) | NASAL | Status: DC

## 2015-07-29 MED ORDER — AZITHROMYCIN 250 MG PO TABS
ORAL_TABLET | ORAL | Status: DC
Start: 2015-07-29 — End: 2015-11-22

## 2015-07-29 MED ORDER — PROMETHAZINE-DM 6.25-15 MG/5ML PO SYRP: ORAL_SOLUTION | ORAL | Status: DC

## 2015-07-29 NOTE — Progress Notes (Signed)
Patient ID: Alicia Gates, female   DOB: [birth year], 65 y.o.   MRN: [id number]  HPI  Patient presents to the office for evaluation of cough with yellow sputum congestion, nasal congestion.  It has been going on for 4 days.  Patient reports night > day, wet, worse with lying down.  They also endorse change in voice, chills, postnasal drip and nasal congestion, sinus pressure, headache, sore throat.  .  They have tried flonase and airborne, advil.  They report that nothing has worked.  They admits to other sick contacts.  She does tend to get some seasonal allergies.    Review of Systems  Constitutional: Negative for fever, chills and malaise/fatigue.  HENT: Positive for congestion, ear pain and sore throat.   Eyes: Negative.   Respiratory: Negative for cough, shortness of breath and wheezing.   Cardiovascular: Negative for chest pain, palpitations and leg swelling.  Skin: Negative.   Neurological: Negative for headaches.    PE:  Filed Vitals:   07/29/15 1410  BP: 102/60  Pulse: 70  Temp: 97.8 F (36.6 C)  Resp: 18   General:  Alert and non-toxic, WDWN, NAD HEENT: NCAT, PERLA, EOM normal, no occular discharge or erythema.  Nasal mucosal edema with sinus tenderness to palpation.  Oropharynx clear with minimal oropharyngeal edema and erythema.  Mucous membranes moist and pink. Neck:  Cervical adenopathy Chest:  RRR no MRGs.  Lungs clear to auscultation A&P with no wheezes rhonchi or rales.   Abdomen: +BS x 4 quadrants, soft, non-tender, no guarding, rigidity, or rebound. Skin: warm and dry no rash Neuro: A&Ox4, CN II-XII grossly intact  Assessment and Plan:   1. Acute URI -nasal saline -zyrtec - azelastine (ASTELIN) 0.1 % nasal spray; Place 2 sprays into both nostrils 2 (two) times daily. Use in each nostril as directed  Dispense: 30 mL; Refill: 12 - azithromycin (ZITHROMAX Z-PAK) 250 MG tablet; 2 po day one, then 1 daily x 4 days  Dispense: 6 tablet; Refill: 0 -  promethazine-dextromethorphan (PROMETHAZINE-DM) 6.25-15 MG/5ML syrup; Take 5-10 mL PO q8hrs prn for cold symptoms  Dispense: 360 mL; Refill: 1

## 2015-11-17 ENCOUNTER — Encounter: Payer: Self-pay | Admitting: Physician Assistant

## 2015-11-18 ENCOUNTER — Encounter: Payer: Self-pay | Admitting: Physician Assistant

## 2015-11-22 ENCOUNTER — Other Ambulatory Visit: Payer: Self-pay | Admitting: Physician Assistant

## 2015-11-22 ENCOUNTER — Encounter: Payer: Self-pay | Admitting: Physician Assistant

## 2015-11-22 ENCOUNTER — Ambulatory Visit (INDEPENDENT_AMBULATORY_CARE_PROVIDER_SITE_OTHER): Payer: Managed Care, Other (non HMO) | Admitting: Physician Assistant

## 2015-11-22 ENCOUNTER — Other Ambulatory Visit (HOSPITAL_COMMUNITY)
Admission: RE | Admit: 2015-11-22 | Discharge: 2015-11-22 | Disposition: A | Discharge status: Home / Self Care | Payer: Managed Care, Other (non HMO) | Source: Ambulatory Visit | Attending: Physician Assistant | Admitting: Physician Assistant

## 2015-11-22 VITALS — BP 118/64 | HR 66 | Temp 97.3°F | Resp 14 | Ht 61.5 in | Wt 116.6 lb

## 2015-11-22 DIAGNOSIS — Z23 Encounter for immunization: Secondary | ICD-10-CM

## 2015-11-22 DIAGNOSIS — Z79899 Other long term (current) drug therapy: Secondary | ICD-10-CM

## 2015-11-22 DIAGNOSIS — M5136 Other intervertebral disc degeneration, lumbar region: Secondary | ICD-10-CM | POA: Diagnosis not present

## 2015-11-22 DIAGNOSIS — Z124 Encounter for screening for malignant neoplasm of cervix: Secondary | ICD-10-CM | POA: Diagnosis not present

## 2015-11-22 DIAGNOSIS — R7309 Other abnormal glucose: Secondary | ICD-10-CM

## 2015-11-22 DIAGNOSIS — Z1159 Encounter for screening for other viral diseases: Secondary | ICD-10-CM

## 2015-11-22 DIAGNOSIS — I7300 Raynaud's syndrome without gangrene: Secondary | ICD-10-CM

## 2015-11-22 DIAGNOSIS — Z0001 Encounter for general adult medical examination with abnormal findings: Secondary | ICD-10-CM

## 2015-11-22 DIAGNOSIS — E785 Hyperlipidemia, unspecified: Secondary | ICD-10-CM | POA: Diagnosis not present

## 2015-11-22 DIAGNOSIS — E559 Vitamin D deficiency, unspecified: Secondary | ICD-10-CM

## 2015-11-22 DIAGNOSIS — I10 Essential (primary) hypertension: Secondary | ICD-10-CM | POA: Diagnosis not present

## 2015-11-22 DIAGNOSIS — F325 Major depressive disorder, single episode, in full remission: Secondary | ICD-10-CM | POA: Diagnosis not present

## 2015-11-22 DIAGNOSIS — Z01419 Encounter for gynecological examination (general) (routine) without abnormal findings: Secondary | ICD-10-CM | POA: Insufficient documentation

## 2015-11-22 DIAGNOSIS — R6889 Other general symptoms and signs: Secondary | ICD-10-CM | POA: Diagnosis not present

## 2015-11-22 DIAGNOSIS — T7840XD Allergy, unspecified, subsequent encounter: Secondary | ICD-10-CM

## 2015-11-22 DIAGNOSIS — Z1151 Encounter for screening for human papillomavirus (HPV): Secondary | ICD-10-CM | POA: Diagnosis not present

## 2015-11-22 DIAGNOSIS — N393 Stress incontinence (female) (male): Secondary | ICD-10-CM

## 2015-11-22 DIAGNOSIS — M8580 Other specified disorders of bone density and structure, unspecified site: Secondary | ICD-10-CM

## 2015-11-22 NOTE — Progress Notes (Signed)
Complete Physical  Assessment and Plan: 1. Essential hypertension - continue medications, DASH diet, exercise and monitor at home. Call if greater than 130/80.  - CBC with Differential/Platelet - BASIC METABOLIC PANEL WITH GFR - Hepatic function panel - TSH - Urinalysis, Routine w reflex microscopic (not at [facility]) - Microalbumin / creatinine urine ratio  2. Hyperlipidemia -continue medications, check lipids, decrease fatty foods, increase activity. - Lipid panel  3. Depression remission  4. Osteopenia  get dexa, continue Vit D and Ca, weight bearing exercises - DG Bone Density; Future- Next year  5. Raynaud disease Refill norvasc  6. DDD (degenerative disc disease), lumbar RICE, NSAIDS, exercises given, if not better get xray and PT referral or ortho referral.   7. Allergy, subsequent encounter - Allegra OTC, increase H20, allergy hygiene explained.  8. Stress incontinence Doing better, can do PT if patient wishes but declines at this time  9. Prediabetes Discussed general issues about diabetes pathophysiology and management., Educational material distributed., Suggested low cholesterol diet., Encouraged aerobic exercise., Discussed foot care., Reminded to get yearly retinal exam. - Hemoglobin A1c - Insulin, fasting  10. Medication management - Magnesium  11. Vitamin D deficiency - Vit D  25 hydroxy (rtn osteoporosis monitoring)  12. Need for shingles vaccine - Varicella-zoster Immune Globulin  13. Screening for cervical cancer - Cytology - PAP  14. Screening for viral disease Get Hep C and HIV  15. Encounter for general adult medical examination with abnormal findings Get MGM   GOES TO [facility] FOR LABS Discussed med's effects and SE's. Screening labs and tests as requested with regular follow-up as recommended.  HPI 65 y.o. female  presents for a complete physical. Her blood pressure has been controlled at home, today their BP is BP: 118/64 mmHg She  does workout. She denies chest pain, shortness of breath, dizziness.  She is not on cholesterol medication and denies myalgias. Her cholesterol is at goal. The cholesterol last visit was:  Lab Results  Component Value Date   CHOL 216* 11/16/2014   HDL 116 11/16/2014   LDLCALC 81 11/16/2014   TRIG 93 11/16/2014    Last A1C in the office was:  Lab Results  Component Value Date   HGBA1C 5.9* 11/16/2014   Patient is on Vitamin D supplement.  Lab Results  Component Value Date   VD25OH 61.4 11/16/2014   She is on estrogen but has had hysterectomy, does not smoke, exercises, and is on bASA.  Goes to Dr. Allyson Sabal once a year.  Dad diagnosed with PMR, has had stroke 2 years ago due to TA. 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   Current Medications:  Current Outpatient Prescriptions on File Prior to Visit  Medication Sig Dispense Refill  . amLODipine (NORVASC) 2.5 MG tablet TAKE 1 TABLET (2.5 MG TOTAL) BY MOUTH DAILY. 90 tablet 3  . BABY ASPIRIN PO Take 81 mg by mouth daily.    Marland Kitchen BIOTIN PO Take 10,000 mg by mouth daily.    . Calcium Carbonate-Vitamin D (CALCIUM + D PO) Take 600 mg by mouth daily.     . Cholecalciferol (VITAMIN D PO) Take 4,000 Int'l Units by mouth 2 (two) times daily.     Marland Kitchen CINNAMON PO Take 1,000 mg by mouth daily.    Marland Kitchen estradiol (ESTRACE) 1 MG tablet TAKE 1 TABLET (1 MG  TOTAL) BY MOUTH DAILY. 90 tablet 3  . Multiple Vitamins-Minerals (MULTIVITAMIN PO) Take by mouth. With calcium    . vitamin C (ASCORBIC ACID) 500 MG tablet Take 500 mg by mouth daily.     No current facility-administered medications on file prior to visit.   Health Maintenance:   Immunization History  Administered Date(s) Administered  . Pneumococcal-Unspecified 02/13/2003  . Tdap 09/29/2011   Tetanus: 2013 Pneumovax: 2004 with a reaction locally Prevnar 13: due at 66 Flu vaccine:  2016 Zostavax: DUE Pap: 08/2012 due 2017 MGM: 01/22/2015 hx of U/S left breast benign DEXA: May 2016 T -1.2 Osteopenia DUE, on estrace Colonoscopy: 07/2014, 2 polyps removed, Dr. Sundra Aland EGD: N/A CXR 08/2008 EYE: Dr. Clydene Laming Yearly Feb 2016 DENSTIST: Dr. Noah Charon  Allergies:  Allergies  Allergen Reactions  . Codeine    Medical History:  Past Medical History  Diagnosis Date  . Hyperlipidemia   . Hypertension   . Depression   . Allergy   . Osteopenia   . Raynaud disease 2010  . DDD (degenerative disc disease), lumbar    Surgical History:  Past Surgical History  Procedure Laterality Date  . Abdominal hysterectomy    . Rhinoplasty     Family History:  Family History  Problem Relation Age of Onset  . Diabetes Mother   . Hypertension Mother   . Arthritis Mother   . Stroke Mother     Dec 2016  . Arthritis Father     PMR  . Cancer Father     colon  . Hypertension Father   . Macular degeneration Father   . Stroke Father     due to TA   Social History:  Social History  Substance Use Topics  . Smoking status: Never Smoker   . Smokeless tobacco: Not on file  . Alcohol Use: Yes     Comment: Rare   Review of Systems  Constitutional: Negative.   HENT: Negative.   Eyes: Negative.   Respiratory: Negative.   Cardiovascular: Negative.   Gastrointestinal: Negative.   Genitourinary: Negative.        + stress incontinence  Musculoskeletal: Negative.   Skin: Negative.   Neurological: Negative.   Endo/Heme/Allergies: Negative.   Psychiatric/Behavioral: Negative.     Physical Exam: Estimated body mass index is 21.68 kg/(m^2) as calculated from the following:   Height as of this encounter: 5' 1.5" (1.562 m).   Weight as of this encounter: 116 lb 9.6 oz (52.889 kg). BP 118/64 mmHg  Pulse 66  Temp(Src) 97.3 F (36.3 C) (Temporal)  Resp 14  Ht 5' 1.5" (1.562 m)  Wt 116 lb 9.6 oz (52.889 kg)  BMI 21.68 kg/m2 General Appearance: Well nourished, in no  apparent distress. Eyes: PERRLA, EOMs, conjunctiva no swelling or erythema, normal fundi and vessels. Sinuses: No Frontal/maxillary tenderness ENT/Mouth: Ext aud canals clear, normal light reflex with TMs without erythema, bulging.  Good dentition. No erythema, swelling, or exudate on post pharynx. Tonsils not swollen or erythematous. Hearing normal.  Neck: Supple, thyroid normal. No bruits Respiratory: Respiratory effort normal, BS equal bilaterally without rales, rhonchi, wheezing or stridor. Cardio: RRR without murmurs, rubs or gallops. Brisk peripheral pulses without edema.  Chest: symmetric, with normal excursions and percussion. Breasts: Symmetric, without lumps, nipple discharge, retractions. Abdomen: Soft, +BS. Non tender, no guarding, rebound, hernias, masses, or organomegaly.  Lymphatics: Non tender without lymphadenopathy.  Genitourinary:normal external genitalia, vulva, vagina, cervix, uterus and adnexa, PAP: Pap smear done today. Musculoskeletal: Full ROM all peripheral  extremities,5/5 strength, and normal gait. + bilateral bunions Skin: Warm, dry without rashes, lesions, ecchymosis.  Neuro: Cranial nerves intact, reflexes equal bilaterally. Normal muscle tone, no cerebellar symptoms. Sensation intact.  Psych: Awake and oriented X 3, normal affect, Insight and Judgment appropriate.   EKG WNL Aorta scan Defer  Vicie Mutters 3:18 PM

## 2015-11-25 LAB — CYTOLOGY - PAP

## 2015-12-24 ENCOUNTER — Other Ambulatory Visit: Payer: Self-pay | Admitting: Physician Assistant

## 2015-12-24 DIAGNOSIS — Z1231 Encounter for screening mammogram for malignant neoplasm of breast: Secondary | ICD-10-CM

## 2016-01-31 ENCOUNTER — Ambulatory Visit
Admission: RE | Admit: 2016-01-31 | Discharge: 2016-01-31 | Disposition: A | Discharge status: Home / Self Care | Payer: Managed Care, Other (non HMO) | Source: Ambulatory Visit | Attending: Physician Assistant | Admitting: Physician Assistant

## 2016-01-31 DIAGNOSIS — Z1231 Encounter for screening mammogram for malignant neoplasm of breast: Secondary | ICD-10-CM

## 2016-03-13 ENCOUNTER — Encounter: Payer: Self-pay | Admitting: Physician Assistant

## 2016-03-13 ENCOUNTER — Ambulatory Visit (INDEPENDENT_AMBULATORY_CARE_PROVIDER_SITE_OTHER): Payer: Managed Care, Other (non HMO) | Admitting: Physician Assistant

## 2016-03-13 ENCOUNTER — Other Ambulatory Visit (HOSPITAL_COMMUNITY)
Admission: RE | Admit: 2016-03-13 | Discharge: 2016-03-13 | Disposition: A | Discharge status: Home / Self Care | Payer: Managed Care, Other (non HMO) | Source: Ambulatory Visit | Attending: Physician Assistant | Admitting: Physician Assistant

## 2016-03-13 VITALS — BP 118/70 | HR 61 | Temp 97.5°F | Resp 14 | Ht 61.5 in | Wt 118.4 lb

## 2016-03-13 DIAGNOSIS — R1032 Left lower quadrant pain: Secondary | ICD-10-CM | POA: Diagnosis not present

## 2016-03-13 DIAGNOSIS — Z1151 Encounter for screening for human papillomavirus (HPV): Secondary | ICD-10-CM | POA: Diagnosis not present

## 2016-03-13 DIAGNOSIS — Z124 Encounter for screening for malignant neoplasm of cervix: Secondary | ICD-10-CM

## 2016-03-13 DIAGNOSIS — Z01419 Encounter for gynecological examination (general) (routine) without abnormal findings: Secondary | ICD-10-CM | POA: Diagnosis present

## 2016-03-13 DIAGNOSIS — R319 Hematuria, unspecified: Secondary | ICD-10-CM | POA: Diagnosis not present

## 2016-03-13 MED ORDER — SULFAMETHOXAZOLE-TRIMETHOPRIM 800-160 MG PO TABS: 1.0000 | ORAL_TABLET | Freq: Two times a day (BID) | ORAL | 0 refills | Status: DC

## 2016-03-13 NOTE — Progress Notes (Signed)
Subjective:    Patient ID: Alicia Gates, female    DOB: [birth year], 65 y.o.   MRN: [id number]  Hematuria  The hematuria occurs during the terminal portion of her urinary stream. She reports no clotting in her urine stream. The pain is moderate. She describes her urine color as light pink. Irritative symptoms include frequency and urgency. Obstructive symptoms do not include dribbling, incomplete emptying, an intermittent stream, a slower stream, straining or a weak stream. Associated symptoms include abdominal pain and dysuria. Pertinent negatives include no chills, facial swelling, fever, flank pain, hesitancy, inability to urinate or nausea. She is sexually active. There is no history of kidney stones.   65 y.o. WF presents with blood in urine x 1 day. She states last night she had a lot of vaginal/suprapubic pressure like she had to urinate, and not pain at this time then this AM wiped, saw pink tinge on TP and now has a lot of pain with end of stream urinating, has frequency. No fever, chills, vaginal discharge, no lower back pain, flank pain. Is on estrogen, has had partial hysterectomy, last pap 2014, due this year. Has history of stress incontinence, does feel last night as if something was falling out/pressure.   Blood pressure 118/70, pulse 61, temperature 97.5 F (36.4 C), resp. rate 14, height 5' 1.5" (1.562 m), weight 118 lb 6.4 oz (53.7 kg), SpO2 95 %.  Medications Current Outpatient Prescriptions on File Prior to Visit  Medication Sig  . amLODipine (NORVASC) 2.5 MG tablet TAKE 1 TABLET (2.5 MG TOTAL) BY MOUTH DAILY.  Marland Kitchen BABY ASPIRIN PO Take 81 mg by mouth daily.  Marland Kitchen BIOTIN PO Take 10,000 mg by mouth daily.  . Calcium Carbonate-Vitamin D (CALCIUM + D PO) Take 600 mg by mouth daily.   . Cholecalciferol (VITAMIN D PO) Take 4,000 Int'l Units by mouth 2 (two) times daily.   Marland Kitchen CINNAMON PO Take 1,000 mg by mouth daily.  Marland Kitchen estradiol (ESTRACE) 1 MG tablet TAKE 1 TABLET (1 MG TOTAL) BY  MOUTH DAILY.  . Multiple Vitamins-Minerals (MULTIVITAMIN PO) Take by mouth. With calcium  . vitamin C (ASCORBIC ACID) 500 MG tablet Take 500 mg by mouth daily.   No current facility-administered medications on file prior to visit.     Problem list She has Hyperlipidemia; Hypertension; Depression, major, in remission ([place]); Allergy; Osteopenia; Raynaud disease; DDD (degenerative disc disease), lumbar; Stress incontinence; and Other abnormal glucose on her problem list.  Review of Systems  Constitutional: Negative for chills and fever.  HENT: Negative for facial swelling.   Gastrointestinal: Positive for abdominal pain. Negative for constipation, diarrhea and nausea.  Genitourinary: Positive for dysuria, frequency, hematuria, urgency and vaginal pain. Negative for flank pain, hesitancy and incomplete emptying.       Objective:   Physical Exam  Constitutional: She is oriented to person, place, and time. She appears well-developed and well-nourished.  HENT:  Head: Normocephalic and atraumatic.  Right Ear: External ear normal.  Left Ear: External ear normal.  Mouth/Throat: Oropharynx is clear and moist.  Eyes: Conjunctivae and EOM are normal. Pupils are equal, round, and reactive to light.  Neck: Normal range of motion. Neck supple. No thyromegaly present.  Cardiovascular: Normal rate, regular rhythm and normal heart sounds.  Exam reveals no gallop and no friction rub.   No murmur heard. Pulmonary/Chest: Effort normal and breath sounds normal. No respiratory distress. She has no wheezes.  Abdominal: Soft. Bowel sounds are normal. She exhibits no distension and no  mass. There is tenderness in the left lower quadrant. There is no rebound and no guarding.  Genitourinary: There is no rash or tenderness on the right labia. There is no rash or tenderness on the left labia. No erythema, tenderness or bleeding in the vagina. No vaginal discharge found.  Genitourinary Comments: + tenderness LLQ  with manual exam, no cystocele, PAP sent  Musculoskeletal: Normal range of motion.  Lymphadenopathy:    She has no cervical adenopathy.  Neurological: She is alert and oriented to person, place, and time. She displays normal reflexes. No cranial nerve deficit. Coordination normal.  Skin: Skin is warm and dry.  Psychiatric: She has a normal mood and affect.      Assessment & Plan:  1. Hematuria, unspecified type Bactrim sent in, check urine - Urinalysis, Routine w reflex microscopic (not at [facility]) - Urine culture  2. Abdominal pain, left lower quadrant If lab negative will get [place] rule out ovarian cyst/mass  3. Cervical cancer screening - Cytology - PAP

## 2016-03-13 NOTE — Patient Instructions (Signed)

## 2016-03-14 LAB — URINALYSIS, ROUTINE W REFLEX MICROSCOPIC
BILIRUBIN URINE: NEGATIVE
GLUCOSE, UA: NEGATIVE
KETONES UR: NEGATIVE
Nitrite: NEGATIVE
PROTEIN: NEGATIVE
Specific Gravity, Urine: 1.006 (ref 1.001–1.035)
pH: 6 (ref 5.0–8.0)

## 2016-03-14 LAB — URINALYSIS, MICROSCOPIC ONLY
Bacteria, UA: NONE SEEN [HPF]
CASTS: NONE SEEN [LPF]
CRYSTALS: NONE SEEN [HPF]
RBC / HPF: NONE SEEN RBC/HPF (ref <=2)
Squamous Epithelial / LPF: NONE SEEN [HPF] (ref <=5)
WBC, UA: >60 WBC/HPF — AB (ref <=5)
Yeast: NONE SEEN [HPF]

## 2016-03-15 LAB — URINE CULTURE

## 2016-03-15 LAB — CYTOLOGY - PAP

## 2016-04-09 ENCOUNTER — Encounter: Payer: Self-pay | Admitting: *Deleted

## 2016-08-04 ENCOUNTER — Other Ambulatory Visit: Payer: Self-pay

## 2016-08-04 ENCOUNTER — Other Ambulatory Visit: Payer: Self-pay | Admitting: Physician Assistant

## 2016-08-04 DIAGNOSIS — J069 Acute upper respiratory infection, unspecified: Secondary | ICD-10-CM

## 2016-08-04 MED ORDER — AZELASTINE HCL 0.1 % NA SOLN: 2.0000 | Freq: Two times a day (BID) | NASAL | 12 refills | Status: DC

## 2016-08-07 ENCOUNTER — Other Ambulatory Visit: Payer: Self-pay

## 2016-08-07 MED ORDER — AMLODIPINE BESYLATE 2.5 MG PO TABS: ORAL_TABLET | ORAL | 1 refills | Status: DC

## 2016-08-07 MED ORDER — ESTRADIOL 1 MG PO TABS: ORAL_TABLET | ORAL | 1 refills | Status: DC

## 2016-09-14 DIAGNOSIS — D235 Other benign neoplasm of skin of trunk: Secondary | ICD-10-CM | POA: Diagnosis not present

## 2016-09-14 DIAGNOSIS — L821 Other seborrheic keratosis: Secondary | ICD-10-CM | POA: Diagnosis not present

## 2016-09-14 DIAGNOSIS — L570 Actinic keratosis: Secondary | ICD-10-CM | POA: Diagnosis not present

## 2016-09-14 DIAGNOSIS — L738 Other specified follicular disorders: Secondary | ICD-10-CM | POA: Diagnosis not present

## 2016-09-14 DIAGNOSIS — D1801 Hemangioma of skin and subcutaneous tissue: Secondary | ICD-10-CM | POA: Diagnosis not present

## 2016-09-14 DIAGNOSIS — L814 Other melanin hyperpigmentation: Secondary | ICD-10-CM | POA: Diagnosis not present

## 2016-12-07 ENCOUNTER — Encounter: Payer: Self-pay | Admitting: Physician Assistant

## 2016-12-07 ENCOUNTER — Ambulatory Visit (INDEPENDENT_AMBULATORY_CARE_PROVIDER_SITE_OTHER): Payer: Medicare Other | Admitting: Physician Assistant

## 2016-12-07 ENCOUNTER — Other Ambulatory Visit: Payer: Self-pay

## 2016-12-07 VITALS — BP 118/76 | HR 72 | Temp 97.7°F | Resp 14 | Ht 61.0 in | Wt 124.0 lb

## 2016-12-07 DIAGNOSIS — M5136 Other intervertebral disc degeneration, lumbar region: Secondary | ICD-10-CM

## 2016-12-07 DIAGNOSIS — I10 Essential (primary) hypertension: Secondary | ICD-10-CM | POA: Diagnosis not present

## 2016-12-07 DIAGNOSIS — F325 Major depressive disorder, single episode, in full remission: Secondary | ICD-10-CM

## 2016-12-07 DIAGNOSIS — E559 Vitamin D deficiency, unspecified: Secondary | ICD-10-CM

## 2016-12-07 DIAGNOSIS — Z136 Encounter for screening for cardiovascular disorders: Secondary | ICD-10-CM | POA: Diagnosis not present

## 2016-12-07 DIAGNOSIS — Z0000 Encounter for general adult medical examination without abnormal findings: Secondary | ICD-10-CM

## 2016-12-07 DIAGNOSIS — Z23 Encounter for immunization: Secondary | ICD-10-CM

## 2016-12-07 DIAGNOSIS — Z0001 Encounter for general adult medical examination with abnormal findings: Secondary | ICD-10-CM | POA: Diagnosis not present

## 2016-12-07 DIAGNOSIS — R6889 Other general symptoms and signs: Secondary | ICD-10-CM | POA: Diagnosis not present

## 2016-12-07 DIAGNOSIS — I7300 Raynaud's syndrome without gangrene: Secondary | ICD-10-CM | POA: Diagnosis not present

## 2016-12-07 DIAGNOSIS — M8580 Other specified disorders of bone density and structure, unspecified site: Secondary | ICD-10-CM

## 2016-12-07 DIAGNOSIS — T7840XD Allergy, unspecified, subsequent encounter: Secondary | ICD-10-CM

## 2016-12-07 DIAGNOSIS — N393 Stress incontinence (female) (male): Secondary | ICD-10-CM

## 2016-12-07 DIAGNOSIS — E785 Hyperlipidemia, unspecified: Secondary | ICD-10-CM | POA: Diagnosis not present

## 2016-12-07 DIAGNOSIS — R7309 Other abnormal glucose: Secondary | ICD-10-CM

## 2016-12-07 DIAGNOSIS — Z79899 Other long term (current) drug therapy: Secondary | ICD-10-CM

## 2016-12-07 LAB — CBC WITH DIFFERENTIAL/PLATELET
BASOS ABS: 0 {cells}/uL (ref 0–200)
Basophils Relative: 0 %
EOS PCT: 1 %
Eosinophils Absolute: 118 cells/uL (ref 15–500)
HCT: 41.1 % (ref 35.0–45.0)
Hemoglobin: 13.6 g/dL (ref 11.7–15.5)
LYMPHS PCT: 20 %
Lymphs Abs: 2360 cells/uL (ref 850–3900)
MCH: 31.5 pg (ref 27.0–33.0)
MCHC: 33.1 g/dL (ref 32.0–36.0)
MCV: 95.1 fL (ref 80.0–100.0)
MPV: 10.1 fL (ref 7.5–12.5)
Monocytes Absolute: 590 cells/uL (ref 200–950)
Monocytes Relative: 5 %
NEUTROS ABS: 8732 {cells}/uL — AB (ref 1500–7800)
Neutrophils Relative %: 74 %
Platelets: 255 10*3/uL (ref 140–400)
RBC: 4.32 MIL/uL (ref 3.80–5.10)
RDW: 13.3 % (ref 11.0–15.0)
WBC: 11.8 10*3/uL — ABNORMAL HIGH (ref 3.8–10.8)

## 2016-12-07 LAB — HEPATIC FUNCTION PANEL
ALBUMIN: 4.4 g/dL (ref 3.6–5.1)
ALT: 16 U/L (ref 6–29)
AST: 22 U/L (ref 10–35)
Alkaline Phosphatase: 97 U/L (ref 33–130)
BILIRUBIN DIRECT: 0.1 mg/dL (ref <=0.2)
BILIRUBIN TOTAL: 0.7 mg/dL (ref 0.2–1.2)
Indirect Bilirubin: 0.6 mg/dL (ref 0.2–1.2)
Total Protein: 6.7 g/dL (ref 6.1–8.1)

## 2016-12-07 LAB — BASIC METABOLIC PANEL WITH GFR
BUN: 19 mg/dL (ref 7–25)
CALCIUM: 9.4 mg/dL (ref 8.6–10.4)
CHLORIDE: 101 mmol/L (ref 98–110)
CO2: 24 mmol/L (ref 20–31)
CREATININE: 1.04 mg/dL — AB (ref 0.50–0.99)
GFR, Est African American: 65 mL/min (ref >=60)
GFR, Est Non African American: 57 mL/min — ABNORMAL LOW (ref >=60)
Glucose, Bld: 87 mg/dL (ref 65–99)
Potassium: 4.4 mmol/L (ref 3.5–5.3)
SODIUM: 136 mmol/L (ref 135–146)

## 2016-12-07 LAB — LIPID PANEL
CHOL/HDL RATIO: 1.8 ratio (ref <5.0)
CHOLESTEROL: 200 mg/dL — AB (ref <200)
HDL: 114 mg/dL (ref >50)
LDL Cholesterol: 70 mg/dL (ref <100)
TRIGLYCERIDES: 78 mg/dL (ref <150)
VLDL: 16 mg/dL (ref <30)

## 2016-12-07 LAB — TSH: TSH: 1.11 m[IU]/L

## 2016-12-07 MED ORDER — AMLODIPINE BESYLATE 2.5 MG PO TABS: ORAL_TABLET | ORAL | 1 refills | Status: DC

## 2016-12-07 MED ORDER — ESTRADIOL 1 MG PO TABS: ORAL_TABLET | ORAL | 1 refills | Status: DC

## 2016-12-07 NOTE — Progress Notes (Signed)
MEDICARE ANNUAL WELLNESS VISIT AND CPE  Assessment:   Essential hypertension - continue medications, DASH diet, exercise and monitor at home. Call if greater than 130/80.  -     CBC with Differential/Platelet -     BASIC METABOLIC PANEL WITH GFR -     Hepatic function panel -     TSH -     Urinalysis, Routine w reflex microscopic -     Microalbumin / creatinine urine ratio -     EKG 12-Lead  Raynaud's disease without gangrene Continue norvasc, stay warm  Osteopenia, unspecified location Osteopenia- get dexa, continue Vit D and Ca, weight bearing exercises  DDD (degenerative disc disease), lumbar monitor  Hyperlipidemia, unspecified hyperlipidemia type -continue medications, check lipids, decrease fatty foods, increase activity.  -     Lipid panel  Depression, major, in remission ([place]) - continue medications, stress management techniques discussed, increase water, good sleep hygiene discussed, increase exercise, and increase veggies.   Allergic state, subsequent encounter - Allegra OTC, increase H20, allergy hygiene explained.  Stress incontinence monitor  Other abnormal glucose Discussed general issues about diabetes pathophysiology and management., Educational material distributed., Suggested low cholesterol diet., Encouraged aerobic exercise., Discussed foot care., Reminded to get yearly retinal exam. -     Hemoglobin A1c  Medication management -     Magnesium  Vitamin D deficiency Continue supplement  Encounter for Medicare annual wellness exam  Other orders -     estradiol (ESTRACE) 1 MG tablet; TAKE 1 TABLET (1 MG TOTAL) BY MOUTH DAILY.   Over 40 minutes of exam, counseling, chart review and critical decision making was performed Future Appointments Date Time Provider [place]  01/03/2018 2:00 PM Unk Pinto, MD [facility] None   Plan:   During the course of the visit the patient was educated and counseled about appropriate screening and  preventive services including:    Pneumococcal vaccine   Prevnar 13  Influenza vaccine  Td vaccine  Screening electrocardiogram  Bone densitometry screening  Colorectal cancer screening  Diabetes screening  Glaucoma screening  Nutrition counseling   Advanced directives: requested   Subjective:  Alicia Gates is a 66 y.o. female who presents for Medicare Annual Wellness Visit and complete physical.    Her blood pressure has been controlled at home, today their BP is BP: 118/76  She does workout. She denies chest pain, shortness of breath, dizziness.  She is not on cholesterol medication and denies myalgias. Her cholesterol is at goal. The cholesterol last visit was:   Lab Results  Component Value Date   CHOL 216 (H) 11/16/2014   HDL 116 11/16/2014   LDLCALC 81 11/16/2014   TRIG 93 11/16/2014    She has been working on diet and exercise for prediabetes, and denies paresthesia of the feet, polydipsia, polyuria and visual disturbances. Last A1C in the office was:  Lab Results  Component Value Date   HGBA1C 5.9 (H) 11/16/2014   Patient is on Vitamin D supplement.   Lab Results  Component Value Date   VD25OH 61.4 11/16/2014     Lab Results  Component Value Date   GFRNONAA 79 11/16/2014   She is on estrogen, s/p hysterectomy, on bASA.  Follows with Dr. Allyson Sabal once a year.  BMI is Body mass index is 23.43 kg/m., she is working on diet and exercise. Wt Readings from Last 3 Encounters:  12/07/16 124 lb (56.2 kg)  03/13/16 118 lb 6.4 oz (53.7 kg)  11/22/15 116 lb 9.6 oz (52.9  kg)    Medication Review: Current Outpatient Prescriptions on File Prior to Visit  Medication Sig Dispense Refill  . amLODipine (NORVASC) 2.5 MG tablet TAKE 1 TABLET (2.5 MG TOTAL) BY MOUTH DAILY. 90 tablet 1  . azelastine (ASTELIN) 0.1 % nasal spray Place 2 sprays into both nostrils 2 (two) times daily. Use in each nostril as directed 30 mL 12  . BABY ASPIRIN PO Take 81 mg by mouth  daily.    Marland Kitchen BIOTIN PO Take 10,000 mg by mouth daily.    . Calcium Carbonate-Vitamin D (CALCIUM + D PO) Take 600 mg by mouth daily.     . Cetirizine HCl (ZYRTEC ALLERGY PO) Take by mouth.    . Cholecalciferol (VITAMIN D PO) Take 4,000 Int'l Units by mouth 2 (two) times daily.     Marland Kitchen CINNAMON PO Take 1,000 mg by mouth daily.    Marland Kitchen estradiol (ESTRACE) 1 MG tablet TAKE 1 TABLET (1 MG TOTAL) BY MOUTH DAILY. 90 tablet 1  . Multiple Vitamins-Minerals (MULTIVITAMIN PO) Take by mouth. With calcium    . vitamin C (ASCORBIC ACID) 500 MG tablet Take 500 mg by mouth daily.     No current facility-administered medications on file prior to visit.     Allergies  Allergen Reactions  . Codeine     Current Problems (verified) Patient Active Problem List   Diagnosis Date Noted  . Other abnormal glucose 11/22/2015  . Stress incontinence 11/13/2014  . Hyperlipidemia   . Hypertension   . Depression, major, in remission ([place])   . Allergy   . Osteopenia   . Raynaud disease   . DDD (degenerative disc disease), lumbar     Screening Tests Immunization History  Administered Date(s) Administered  . Pneumococcal-Unspecified 02/13/2003  . Tdap 09/29/2011  . Varicella Zoster Immune Globulin 11/22/2015   Tetanus: 2013 Pneumovax: 2004 with a reaction locally Prevnar 13: will get Flu vaccine: 2017 Zostavax: 2017  Pap: 11/2015 normal, does not need another MGM: 01/22/2016 hx of U/S left breast benign DEXA: May 2016 T -1.2 Osteopenia will wait 3 years, on estrace Colonoscopy: 07/2014, 2 polyps removed, Dr. Sundra Aland EGD: N/A CXR 08/2008   Names of Other Physician/Practitioners you currently use: 1. [facility] here for primary care 2. Dr. Clydene Laming, eye doctor, last visit Feb 2017 3. Dr. Noah Charon, dentist May 2018 Patient Care Team: Unk Pinto, MD as PCP - General (Internal Medicine) Suella Broad, MD as Consulting Physician (Physical Medicine and  Rehabilitation) Rockey Situ, Kathlene November, MD as Consulting Physician (Cardiology) Ronald Lobo, MD as Consulting Physician (Gastroenterology) Druscilla Brownie, MD as Consulting Physician (Dermatology)  SURGICAL HISTORY She  has a past surgical history that includes Abdominal hysterectomy and Rhinoplasty. FAMILY HISTORY Her family history includes Arthritis in her father and mother; Cancer in her father; Diabetes in her mother; Hypertension in her father and mother; Macular degeneration in her father; Stroke in her father and mother. SOCIAL HISTORY She  reports that she has never smoked. She has never used smokeless tobacco. She reports that she drinks alcohol. She reports that she does not use drugs.   MEDICARE WELLNESS OBJECTIVES: Physical activity: Current Exercise Habits: Home exercise routine, Type of exercise: walking (running), Time (Minutes): 40, Frequency (Times/Week): 3, Weekly Exercise (Minutes/Week): 120, Intensity: Moderate Cardiac risk factors: Cardiac Risk Factors include: advanced age (>61men, >38 women);hypertension;dyslipidemia Depression/mood screen:   Depression screen [facility] 2/9 12/07/2016  Decreased Interest 0  Down, Depressed, Hopeless 0  PHQ - 2 Score 0  ADLs:  In your present state of health, do you have any difficulty performing the following activities: 12/07/2016  Hearing? N  Vision? N  Difficulty concentrating or making decisions? N  Walking or climbing stairs? N  Dressing or bathing? N  Doing errands, shopping? N  Some recent data might be hidden     Cognitive Testing  Alert? Yes  Normal Appearance?Yes  Oriented to person? Yes  Place? Yes   Time? Yes  Recall of three objects?  Yes  Can perform simple calculations? Yes  Displays appropriate judgment?Yes  Can read the correct time from a watch face?Yes  EOL planning: Does Patient Have a Medical Advance Directive?: Yes Type of Advance Directive: Healthcare Power of Attorney, Living will Copy of [place] in Chart?: No - copy requested  Review of Systems  Constitutional: Negative.   HENT: Negative.   Eyes: Negative.   Respiratory: Negative.   Cardiovascular: Negative.   Gastrointestinal: Negative.   Genitourinary: Negative.   Musculoskeletal: Negative.   Skin: Negative.      Objective:     Today's Vitals   12/07/16 1407  BP: 118/76  Pulse: 72  Resp: 14  Temp: 97.7 F (36.5 C)  SpO2: 98%  Weight: 124 lb (56.2 kg)  Height: 5\' 1"  (1.549 m)  PainSc: 0-No pain   Body mass index is 23.43 kg/m.  General appearance: alert, no distress, WD/WN, female HEENT: normocephalic, sclerae anicteric, TMs pearly, nares patent, no discharge or erythema, pharynx normal Oral cavity: MMM, no lesions Neck: supple, no lymphadenopathy, no thyromegaly, no masses Heart: RRR, normal S1, S2, no murmurs Lungs: CTA bilaterally, no wheezes, rhonchi, or rales Abdomen: +bs, soft, non tender, non distended, no masses, no hepatomegaly, no splenomegaly Musculoskeletal: nontender, no swelling, no obvious deformity Extremities: no edema, no cyanosis, no clubbing Pulses: 2+ symmetric, upper and lower extremities, normal cap refill Neurological: alert, oriented x 3, CN2-12 intact, strength normal upper extremities and lower extremities, sensation normal throughout, DTRs 2+ throughout, no cerebellar signs, gait normal Psychiatric: normal affect, behavior normal, pleasant   Medicare Attestation I have personally reviewed: The patient's medical and social history Their use of alcohol, tobacco or illicit drugs Their current medications and supplements The patient's functional ability including ADLs,fall risks, home safety risks, cognitive, and hearing and visual impairment Diet and physical activities Evidence for depression or mood disorders  The patient's weight, height, BMI, and visual acuity have been recorded in the chart.  I have made referrals, counseling, and provided education to the  patient based on review of the above and I have provided the patient with a written personalized care plan for preventive services.     Vicie Mutters, PA-C   12/07/2016

## 2016-12-07 NOTE — Patient Instructions (Signed)

## 2016-12-08 LAB — HEMOGLOBIN A1C
HEMOGLOBIN A1C: 5.3 % (ref <5.7)
Mean Plasma Glucose: 105 mg/dL

## 2016-12-08 LAB — URINALYSIS, ROUTINE W REFLEX MICROSCOPIC
Bilirubin Urine: NEGATIVE
GLUCOSE, UA: NEGATIVE
HGB URINE DIPSTICK: NEGATIVE
Ketones, ur: NEGATIVE
LEUKOCYTES UA: NEGATIVE
NITRITE: NEGATIVE
PH: 6 (ref 5.0–8.0)
Protein, ur: NEGATIVE
SPECIFIC GRAVITY, URINE: 1.005 (ref 1.001–1.035)

## 2016-12-08 LAB — MICROALBUMIN / CREATININE URINE RATIO: Creatinine, Urine: 22 mg/dL (ref 20–320)

## 2016-12-18 ENCOUNTER — Other Ambulatory Visit: Payer: Self-pay | Admitting: Physician Assistant

## 2016-12-18 DIAGNOSIS — I10 Essential (primary) hypertension: Secondary | ICD-10-CM

## 2016-12-26 ENCOUNTER — Other Ambulatory Visit: Payer: Self-pay | Admitting: Physician Assistant

## 2016-12-26 DIAGNOSIS — Z1231 Encounter for screening mammogram for malignant neoplasm of breast: Secondary | ICD-10-CM

## 2017-01-04 ENCOUNTER — Other Ambulatory Visit: Payer: Medicare Other

## 2017-01-04 DIAGNOSIS — Z79899 Other long term (current) drug therapy: Secondary | ICD-10-CM | POA: Insufficient documentation

## 2017-01-04 LAB — BASIC METABOLIC PANEL WITH GFR
BUN: 17 mg/dL (ref 7–25)
CHLORIDE: 102 mmol/L (ref 98–110)
CO2: 25 mmol/L (ref 20–31)
CREATININE: 0.93 mg/dL (ref 0.50–0.99)
Calcium: 9.6 mg/dL (ref 8.6–10.4)
GFR, Est African American: 75 mL/min (ref >=60)
GFR, Est Non African American: 65 mL/min (ref >=60)
GLUCOSE: 85 mg/dL (ref 65–99)
Potassium: 5 mmol/L (ref 3.5–5.3)
Sodium: 136 mmol/L (ref 135–146)

## 2017-01-04 LAB — CBC WITH DIFFERENTIAL/PLATELET
BASOS ABS: 62 {cells}/uL (ref 0–200)
Basophils Relative: 1 %
EOS ABS: 186 {cells}/uL (ref 15–500)
Eosinophils Relative: 3 %
HEMATOCRIT: 42.4 % (ref 35.0–45.0)
HEMOGLOBIN: 14 g/dL (ref 11.7–15.5)
LYMPHS PCT: 38 %
Lymphs Abs: 2356 cells/uL (ref 850–3900)
MCH: 31.8 pg (ref 27.0–33.0)
MCHC: 33 g/dL (ref 32.0–36.0)
MCV: 96.4 fL (ref 80.0–100.0)
MONO ABS: 496 {cells}/uL (ref 200–950)
MPV: 10.7 fL (ref 7.5–12.5)
Monocytes Relative: 8 %
NEUTROS PCT: 50 %
Neutro Abs: 3100 cells/uL (ref 1500–7800)
Platelets: 275 10*3/uL (ref 140–400)
RBC: 4.4 MIL/uL (ref 3.80–5.10)
RDW: 13.3 % (ref 11.0–15.0)
WBC: 6.2 10*3/uL (ref 3.8–10.8)

## 2017-01-04 NOTE — Progress Notes (Signed)
Pt presents for CBC & BMP.

## 2017-01-05 NOTE — Progress Notes (Signed)
Pt aware of lab results & voiced understanding of those results. Lab results were printed out for pt as request w/ letter to explain lab results.

## 2017-01-27 ENCOUNTER — Other Ambulatory Visit: Payer: Self-pay | Admitting: Physician Assistant

## 2017-02-01 ENCOUNTER — Ambulatory Visit
Admission: RE | Admit: 2017-02-01 | Discharge: 2017-02-01 | Disposition: A | Discharge status: Home / Self Care | Payer: Medicare Other | Source: Ambulatory Visit | Attending: Physician Assistant | Admitting: Physician Assistant

## 2017-02-01 DIAGNOSIS — Z1231 Encounter for screening mammogram for malignant neoplasm of breast: Secondary | ICD-10-CM | POA: Diagnosis not present

## 2017-03-19 DIAGNOSIS — H35033 Hypertensive retinopathy, bilateral: Secondary | ICD-10-CM | POA: Diagnosis not present

## 2017-06-27 ENCOUNTER — Other Ambulatory Visit: Payer: Self-pay | Admitting: Physician Assistant

## 2017-06-27 MED ORDER — OSELTAMIVIR PHOSPHATE 75 MG PO CAPS: 75.0000 mg | ORAL_CAPSULE | Freq: Every day | ORAL | 0 refills | Status: DC

## 2017-06-27 NOTE — Progress Notes (Signed)
Influenza is a virus that does not respond to antibiotics. We can give you medications to treat the symptoms.   Tamiflu is a medication that can be given to prevent the flu if someone in your house has been diagnosed with the flu or is suppose to help decrease the duration of the flu. If taken within 2 days of symptoms it has been proven to only shorten the duration of the flu 30% of the time and only by 30% of the duration. In addition, the side effects of this medication can be just as bad as the flu and include:  -allergic reactions like skin rash, itching or hives, swelling of the face, lips, or tongue -anxiety, confusion, unusual behavior -breathing problems -hallucination, loss of contact with reality -redness, blistering, peeling or loosening of the skin, including inside the mouth -seizures Side effects that usually do not require medical attention (report to your doctor or health care professional if they continue or are bothersome): -cough -diarrhea -dizziness -headache -nausea, vomiting -stomach pain

## 2017-08-20 ENCOUNTER — Other Ambulatory Visit: Payer: Self-pay | Admitting: *Deleted

## 2017-08-20 DIAGNOSIS — M8580 Other specified disorders of bone density and structure, unspecified site: Secondary | ICD-10-CM

## 2017-08-20 MED ORDER — AMLODIPINE BESYLATE 2.5 MG PO TABS: ORAL_TABLET | ORAL | 1 refills | Status: DC

## 2017-08-20 MED ORDER — ESTRADIOL 1 MG PO TABS: ORAL_TABLET | ORAL | 1 refills | Status: DC

## 2017-10-22 DIAGNOSIS — L309 Dermatitis, unspecified: Secondary | ICD-10-CM | POA: Diagnosis not present

## 2017-10-22 DIAGNOSIS — D1801 Hemangioma of skin and subcutaneous tissue: Secondary | ICD-10-CM | POA: Diagnosis not present

## 2017-10-22 DIAGNOSIS — L814 Other melanin hyperpigmentation: Secondary | ICD-10-CM | POA: Diagnosis not present

## 2017-10-22 DIAGNOSIS — L570 Actinic keratosis: Secondary | ICD-10-CM | POA: Diagnosis not present

## 2017-10-22 DIAGNOSIS — L988 Other specified disorders of the skin and subcutaneous tissue: Secondary | ICD-10-CM | POA: Diagnosis not present

## 2017-10-22 DIAGNOSIS — L821 Other seborrheic keratosis: Secondary | ICD-10-CM | POA: Diagnosis not present

## 2017-10-22 DIAGNOSIS — D225 Melanocytic nevi of trunk: Secondary | ICD-10-CM | POA: Diagnosis not present

## 2017-10-22 DIAGNOSIS — L820 Inflamed seborrheic keratosis: Secondary | ICD-10-CM | POA: Diagnosis not present

## 2017-11-05 DIAGNOSIS — H35033 Hypertensive retinopathy, bilateral: Secondary | ICD-10-CM | POA: Diagnosis not present

## 2017-11-29 ENCOUNTER — Encounter: Payer: Self-pay | Admitting: Podiatry

## 2017-11-29 ENCOUNTER — Ambulatory Visit (INDEPENDENT_AMBULATORY_CARE_PROVIDER_SITE_OTHER): Payer: Medicare Other | Admitting: Podiatry

## 2017-11-29 ENCOUNTER — Ambulatory Visit (INDEPENDENT_AMBULATORY_CARE_PROVIDER_SITE_OTHER): Payer: Medicare Other

## 2017-11-29 DIAGNOSIS — M79671 Pain in right foot: Secondary | ICD-10-CM | POA: Diagnosis not present

## 2017-11-29 DIAGNOSIS — M722 Plantar fascial fibromatosis: Secondary | ICD-10-CM

## 2017-11-29 MED ORDER — TRIAMCINOLONE ACETONIDE 10 MG/ML IJ SUSP
10.0000 mg | Freq: Once | INTRAMUSCULAR | Status: AC
Start: 2017-11-29 — End: 2017-11-29
  Administered 2017-11-29: 10 mg

## 2017-11-29 MED ORDER — DICLOFENAC SODIUM 75 MG PO TBEC: 75.0000 mg | DELAYED_RELEASE_TABLET | Freq: Two times a day (BID) | ORAL | 2 refills | Status: DC

## 2017-11-29 NOTE — Patient Instructions (Signed)

## 2017-11-30 NOTE — Progress Notes (Signed)
Subjective:   Patient ID: Alicia Gates, female   DOB: 67 y.o.   MRN: [id number]   HPI Patient presents with exquisite discomfort plantar aspect right heel at the insertional point of the tendon into the calcaneus with inflammation and fluid around the medial band.  States is been present for several months patient states she does not currently smoke and likes to be active   Review of Systems  All other systems reviewed and are negative.       Objective:  Physical Exam  Constitutional: She appears well-developed and well-nourished.  Cardiovascular: Intact distal pulses.  Pulmonary/Chest: Effort normal.  Musculoskeletal: Normal range of motion.  Neurological: She is alert.  Skin: Skin is warm.  Nursing note and vitals reviewed.   Neurovascular status intact muscle strength is adequate range of motion within normal limits with patient found to have exquisite discomfort plantar aspect right heel at the insertional point of the tendon into the calcaneus with inflammation and fluid around the medial band.  Patient is noted to have good digital perfusion and is well oriented x3     Assessment:  Acute plantar fasciitis right with inflammation fluid noted     Plan:  H&P x-ray reviewed and today I injected the plantar fascia at its insertion with a sterile injection of 3 mg Kenalog 5 mg Xylocaine applied fascial brace to lift the arch gave instructions for physical therapy shoe gear modification and reappoint to recheck again in the next several weeks  X-ray indicates there is small spur with no indications of stress fracture or arthritis

## 2017-12-17 ENCOUNTER — Ambulatory Visit (INDEPENDENT_AMBULATORY_CARE_PROVIDER_SITE_OTHER): Payer: Medicare Other | Admitting: Podiatry

## 2017-12-17 ENCOUNTER — Encounter: Payer: Self-pay | Admitting: Podiatry

## 2017-12-17 DIAGNOSIS — M722 Plantar fascial fibromatosis: Secondary | ICD-10-CM

## 2017-12-19 NOTE — Progress Notes (Signed)
Subjective:   Patient ID: Alicia Gates, female   DOB: 67 y.o.   MRN: [id number]   HPI Patient states still having a lot of pain in the heel and it seems worse after sitting or when she gets up in the morning and does not feel like she had a significant response to the medication   ROS      Objective:  Physical Exam  Neurovascular status intact with patient found to have continued inflammation plantar aspect right heel with fluid buildup and also mild discomfort posterior aspect of the right heel     Assessment:  Acute plantar fasciitis and so far has not responded along with moderate Achilles tendinitis     Plan:  H&P and discussed the relationship of the 2 conditions and for the plantar heel wound begin aggressive ice therapy along with night splint which was dispensed today with all instructions for usage and I do want her to wear to sleep and for the next 2 weeks.  Patient will be seen back and hopefully the symptoms will have sent reduced quite a bit by that

## 2017-12-31 ENCOUNTER — Ambulatory Visit (INDEPENDENT_AMBULATORY_CARE_PROVIDER_SITE_OTHER): Payer: Medicare Other | Admitting: Podiatry

## 2017-12-31 ENCOUNTER — Encounter: Payer: Self-pay | Admitting: Podiatry

## 2017-12-31 DIAGNOSIS — M7661 Achilles tendinitis, right leg: Secondary | ICD-10-CM

## 2017-12-31 MED ORDER — TRIAMCINOLONE ACETONIDE 10 MG/ML IJ SUSP
10.0000 mg | Freq: Once | INTRAMUSCULAR | Status: AC
  Administered 2017-12-31: 10 mg

## 2018-01-03 ENCOUNTER — Encounter: Payer: Self-pay | Admitting: Internal Medicine

## 2018-01-07 ENCOUNTER — Other Ambulatory Visit: Payer: Self-pay | Admitting: Internal Medicine

## 2018-01-07 ENCOUNTER — Other Ambulatory Visit: Payer: Self-pay | Admitting: Physician Assistant

## 2018-01-07 DIAGNOSIS — Z1231 Encounter for screening mammogram for malignant neoplasm of breast: Secondary | ICD-10-CM

## 2018-02-11 ENCOUNTER — Ambulatory Visit (INDEPENDENT_AMBULATORY_CARE_PROVIDER_SITE_OTHER): Payer: Medicare Other

## 2018-02-11 ENCOUNTER — Ambulatory Visit (INDEPENDENT_AMBULATORY_CARE_PROVIDER_SITE_OTHER): Payer: Medicare Other | Admitting: Podiatry

## 2018-02-11 ENCOUNTER — Encounter: Payer: Self-pay | Admitting: Podiatry

## 2018-02-11 DIAGNOSIS — M7751 Other enthesopathy of right foot: Secondary | ICD-10-CM | POA: Diagnosis not present

## 2018-02-11 DIAGNOSIS — M779 Enthesopathy, unspecified: Secondary | ICD-10-CM

## 2018-02-11 DIAGNOSIS — M722 Plantar fascial fibromatosis: Secondary | ICD-10-CM | POA: Diagnosis not present

## 2018-02-11 MED ORDER — TRIAMCINOLONE ACETONIDE 10 MG/ML IJ SUSP
10.0000 mg | Freq: Once | INTRAMUSCULAR | Status: AC
  Administered 2018-02-11: 10 mg

## 2018-02-12 NOTE — Progress Notes (Signed)
Subjective:   Patient ID: Alicia Gates, female   DOB: 67 y.o.   MRN: [id number]   HPI Patient states she is still having pain on the outside of her foot but that is improved but it seems like the ankle has really gotten sore and she continues to try to modify her activity and shoe gear usage   ROS      Objective:  Physical Exam  Neurovascular status intact with mild to moderate discomfort plantar and lateral side of the right heel with patient also noted to have exquisite discomfort now in the sinus tarsi with fluid buildup noted     Assessment:  Inflammatory fasciitis of the right plantar heel still noted but improved with acute sinus tarsitis right     Plan:  H&P condition reviewed and careful sinus tarsi injection administered 3 mg Kenalog 5 mg Xylocaine and advised on physical therapy and shoe gear modification for the plantar heel.  Patient will be seen back as needed and hopefully this will reduce all remaining symptoms.  X-rays taken today indicated no acute stress fracture or indications of change in the sinus tarsi right

## 2018-02-15 ENCOUNTER — Ambulatory Visit (INDEPENDENT_AMBULATORY_CARE_PROVIDER_SITE_OTHER): Payer: Medicare Other | Admitting: Internal Medicine

## 2018-02-15 ENCOUNTER — Ambulatory Visit
Admission: RE | Admit: 2018-02-15 | Discharge: 2018-02-15 | Disposition: A | Discharge status: Home / Self Care | Payer: Medicare Other | Source: Ambulatory Visit | Attending: Internal Medicine | Admitting: Internal Medicine

## 2018-02-15 ENCOUNTER — Encounter: Payer: Self-pay | Admitting: Internal Medicine

## 2018-02-15 VITALS — BP 122/82 | HR 60 | Temp 97.3°F | Resp 16 | Ht 61.5 in | Wt 120.4 lb

## 2018-02-15 DIAGNOSIS — Z1211 Encounter for screening for malignant neoplasm of colon: Secondary | ICD-10-CM

## 2018-02-15 DIAGNOSIS — Z1231 Encounter for screening mammogram for malignant neoplasm of breast: Secondary | ICD-10-CM | POA: Diagnosis not present

## 2018-02-15 DIAGNOSIS — I10 Essential (primary) hypertension: Secondary | ICD-10-CM

## 2018-02-15 DIAGNOSIS — M109 Gout, unspecified: Secondary | ICD-10-CM | POA: Diagnosis not present

## 2018-02-15 DIAGNOSIS — Z1212 Encounter for screening for malignant neoplasm of rectum: Secondary | ICD-10-CM

## 2018-02-15 DIAGNOSIS — R7309 Other abnormal glucose: Secondary | ICD-10-CM | POA: Diagnosis not present

## 2018-02-15 DIAGNOSIS — Z79899 Other long term (current) drug therapy: Secondary | ICD-10-CM | POA: Diagnosis not present

## 2018-02-15 DIAGNOSIS — Z136 Encounter for screening for cardiovascular disorders: Secondary | ICD-10-CM

## 2018-02-15 DIAGNOSIS — Z8249 Family history of ischemic heart disease and other diseases of the circulatory system: Secondary | ICD-10-CM

## 2018-02-15 DIAGNOSIS — E559 Vitamin D deficiency, unspecified: Secondary | ICD-10-CM

## 2018-02-15 DIAGNOSIS — E782 Mixed hyperlipidemia: Secondary | ICD-10-CM | POA: Diagnosis not present

## 2018-02-15 DIAGNOSIS — J069 Acute upper respiratory infection, unspecified: Secondary | ICD-10-CM | POA: Diagnosis not present

## 2018-02-15 MED ORDER — AZELASTINE HCL 0.1 % NA SOLN: 2.0000 | Freq: Two times a day (BID) | NASAL | 12 refills | Status: DC

## 2018-02-15 MED ORDER — AMLODIPINE BESYLATE 2.5 MG PO TABS: ORAL_TABLET | ORAL | 1 refills | Status: DC

## 2018-02-15 NOTE — Patient Instructions (Signed)

## 2018-02-15 NOTE — Progress Notes (Signed)
[facility] Unk Pinto, M.D.     Uvaldo Bristle. Silverio Lay, P.A.-C Liane Comber, [place] [address] [place] [address] Telephone [phone] Telefax [phone] Annual Screening/Preventative Visit & Comprehensive Evaluation &  Examination     This very nice 67 y.o. MWF  presents for a Screening /Preventative Visit & comprehensive evaluation and management of multiple medical co-morbidities.  Patient has been followed for HTN, HLD, Prediabetes  and Vitamin D Deficiency.      HTN predates since     . Patient's BP has been controlled at home and patient denies any cardiac symptoms as chest pain, palpitations, shortness of breath, dizziness or ankle swelling. Today's BP is at goal - 122/82.      Patient's hyperlipidemia is controlled with diet and medications. Patient denies myalgias or other medication SE's. Last lipids were at goal:  Lab Results  Component Value Date   CHOL 200 (H) 12/07/2016   HDL 114 12/07/2016   LDLCALC 70 12/07/2016   TRIG 78 12/07/2016   CHOLHDL 1.8 12/07/2016      Patient has hx/o  prediabetes predating since      and patient denies reactive hypoglycemic symptoms, visual blurring, diabetic polys, or paresthesias. Last A1c was Normal & at goal: Lab Results  Component Value Date   HGBA1C 5.3 12/07/2016      Finally, patient has history of Vitamin D Deficiency and last Vitamin D was at goal; Lab Results  Component Value Date   VD25OH 61.4 11/16/2014   Current Outpatient Medications on File Prior to Visit  Medication Sig  . amLODipine (NORVASC) 2.5 MG tablet TAKE 1 TABLET (2.5 MG TOTAL) BY MOUTH DAILY.  Marland Kitchen azelastine (ASTELIN) 0.1 % nasal spray Place 2 sprays into both nostrils 2 (two) times daily. Use in each nostril as directed  . BABY ASPIRIN PO Take 81 mg by mouth daily.  Marland Kitchen BIOTIN PO Take 10,000 mg by mouth daily.  . Calcium Carbonate-Vitamin D (CALCIUM + D PO) Take 600  mg by mouth daily.   . Cetirizine HCl (ZYRTEC ALLERGY PO) Take by mouth.  . Cholecalciferol (VITAMIN D PO) Take 2,000 Int'l Units by mouth 2 (two) times daily.   Marland Kitchen CINNAMON PO Take 1,000 mg by mouth daily.  . diclofenac (VOLTAREN) 75 MG EC tablet Take 1 tablet (75 mg total) by mouth 2 (two) times daily.  Marland Kitchen estradiol (ESTRACE) 1 MG tablet TAKE 1 TABLET (1 MG TOTAL) BY MOUTH DAILY.  . Multiple Vitamins-Minerals (MULTIVITAMIN PO) Take by mouth. With calcium  . tretinoin (RETIN-A) 0.1 % cream APPLICATION THIN LAYER TO FACE AT BEDTIME WASH OFF EACH AM  . triamcinolone cream (KENALOG) 0.1 % APPLY TO AFFECTED AREA TWICE A DAY AS NEEDED  . vitamin C (ASCORBIC ACID) 500 MG tablet Take 500 mg by mouth daily.   No current facility-administered medications on file prior to visit.    Allergies  Allergen Reactions  . Codeine    Past Medical History:  Diagnosis Date  . Allergy   . DDD (degenerative disc disease), lumbar   . Depression   . Hyperlipidemia   . Hypertension   . Osteopenia   . Raynaud disease 2010   Health Maintenance  Topic Date Due  . PNA vac Low Risk Adult (2 of 2 - PPSV23) 12/07/2017  . INFLUENZA VACCINE  01/03/2018  . MAMMOGRAM  02/02/2019  . COLONOSCOPY  08/01/2019  . TETANUS/TDAP  09/28/2021  . DEXA SCAN  Completed  .  Hepatitis C Screening  Completed   Immunization History  Administered Date(s) Administered  . Pneumococcal Conjugate-13 12/07/2016  . Pneumococcal-Unspecified 02/13/2003  . Tdap 09/29/2011  . Varicella Zoster Immune Globulin 11/22/2015   Last Colon -  Last MGM -  Past Surgical History:  Procedure Laterality Date  . ABDOMINAL HYSTERECTOMY    . RHINOPLASTY     Family History  Problem Relation Age of Onset  . Diabetes Mother   . Hypertension Mother   . Arthritis Mother   . Stroke Mother        Dec 2016  . Arthritis Father        PMR  . Cancer Father        colon  . Hypertension Father   . Macular degeneration Father   . Stroke Father         due to TA   Social History   Tobacco Use  . Smoking status: Never Smoker  . Smokeless tobacco: Never Used  Substance Use Topics  . Alcohol use: Yes    Comment: Rare  . Drug use: No    ROS Constitutional: Denies fever, chills, weight loss/gain, headaches, insomnia,  night sweats, and change in appetite. Does c/o fatigue. Eyes: Denies redness, blurred vision, diplopia, discharge, itchy, watery eyes.  ENT: Denies discharge, congestion, post nasal drip, epistaxis, sore throat, earache, hearing loss, dental pain, Tinnitus, Vertigo, Sinus pain, snoring.  Cardio: Denies chest pain, palpitations, irregular heartbeat, syncope, dyspnea, diaphoresis, orthopnea, PND, claudication, edema Respiratory: denies cough, dyspnea, DOE, pleurisy, hoarseness, laryngitis, wheezing.  Gastrointestinal: Denies dysphagia, heartburn, reflux, water brash, pain, cramps, nausea, vomiting, bloating, diarrhea, constipation, hematemesis, melena, hematochezia, jaundice, hemorrhoids Genitourinary: Denies dysuria, frequency, urgency, nocturia, hesitancy, discharge, hematuria, flank pain Breast: Breast lumps, nipple discharge, bleeding.  Musculoskeletal: Denies arthralgia, myalgia, stiffness, Jt. Swelling, pain, limp, and strain/sprain. Denies falls. Skin: Denies puritis, rash, hives, warts, acne, eczema, changing in skin lesion Neuro: No weakness, tremor, incoordination, spasms, paresthesia, pain Psychiatric: Denies confusion, memory loss, sensory loss. Denies Depression. Endocrine: Denies change in weight, skin, hair change, nocturia, and paresthesia, diabetic polys, visual blurring, hyper / hypo glycemic episodes.  Heme/Lymph: No excessive bleeding, bruising, enlarged lymph nodes.  Physical Exam  BP 122/82   Pulse 60   Temp (!) 97.3 F (36.3 C)   Resp 16   Ht 5' 1.5" (1.562 m)   Wt 120 lb 6.4 oz (54.6 kg)   BMI 22.38 kg/m   General Appearance: Well nourished, well groomed and in no apparent distress.  Eyes:  PERRLA, EOMs, conjunctiva no swelling or erythema, normal fundi and vessels. Sinuses: No frontal/maxillary tenderness ENT/Mouth: EACs patent / TMs  nl. Nares clear without erythema, swelling, mucoid exudates. Oral hygiene is good. No erythema, swelling, or exudate. Tongue normal, non-obstructing. Tonsils not swollen or erythematous. Hearing normal.  Neck: Supple, thyroid not palpable. No bruits, nodes or JVD. Respiratory: Respiratory effort normal.  BS equal and clear bilateral without rales, rhonci, wheezing or stridor. Cardio: Heart sounds are normal with regular rate and rhythm and no murmurs, rubs or gallops. Peripheral pulses are normal and equal bilaterally without edema. No aortic or femoral bruits. Chest: symmetric with normal excursions and percussion. Breasts: Symmetric, without lumps, nipple discharge, retractions, or fibrocystic changes.  Abdomen: Flat, soft with bowel sounds active. Nontender, no guarding, rebound, hernias, masses, or organomegaly.  Lymphatics: Non tender without lymphadenopathy.  Genitourinary:  Musculoskeletal: Full ROM all peripheral extremities, joint stability, 5/5 strength, and normal gait. Skin: Warm and dry without rashes, lesions,  cyanosis, clubbing or  ecchymosis.  Neuro: Cranial nerves intact, reflexes equal bilaterally. Normal muscle tone, no cerebellar symptoms. Sensation intact.  Pysch: Alert and oriented X 3, normal affect, Insight and Judgment appropriate.   Assessment and Plan  1. Essential hypertension  - EKG 12-Lead - Urinalysis, Routine w reflex microscopic - Microalbumin / creatinine urine ratio - CBC with Differential/Platelet - COMPLETE METABOLIC PANEL WITH GFR - Magnesium - TSH  2. Hyperlipidemia, mixed  - EKG 12-Lead - Lipid panel - TSH  3. Abnormal glucose  - EKG 12-Lead - Hemoglobin A1c - Insulin, random  4. Vitamin D deficiency  - VITAMIN D 25 Hydroxyl  5. Screening for colorectal cancer  - POC Hemoccult  Bld/Stl  6. Screening for ischemic heart disease  - EKG 12-Lead - Lipid panel  7. FH: hypertension  - EKG 12-Lead  8. Medication management  - Urinalysis, Routine w reflex microscopic - Microalbumin / creatinine urine ratio - CBC with Differential/Platelet - COMPLETE METABOLIC PANEL WITH GFR - Magnesium - Lipid panel - TSH - Hemoglobin A1c - Insulin, random - VITAMIN D 25 Hydroxyl            Patient was counseled in prudent diet to achieve/maintain BMI less than 25 for weight control, BP monitoring, regular exercise and medications. Discussed med's effects and SE's. Screening labs and tests as requested with regular follow-up as recommended. Over 40 minutes of exam, counseling, chart review and high complex critical decision making was performed.

## 2018-02-16 ENCOUNTER — Other Ambulatory Visit: Payer: Self-pay | Admitting: Internal Medicine

## 2018-02-16 LAB — URIC ACID: URIC ACID, SERUM: 3.9 mg/dL (ref 2.5–7.0)

## 2018-02-16 MED ORDER — ROSUVASTATIN CALCIUM 40 MG PO TABS: ORAL_TABLET | ORAL | 1 refills | Status: DC

## 2018-02-17 ENCOUNTER — Encounter: Payer: Self-pay | Admitting: Internal Medicine

## 2018-02-18 ENCOUNTER — Other Ambulatory Visit: Payer: Self-pay | Admitting: Internal Medicine

## 2018-02-18 DIAGNOSIS — E782 Mixed hyperlipidemia: Secondary | ICD-10-CM

## 2018-02-18 LAB — CBC WITH DIFFERENTIAL/PLATELET
BASOS PCT: 0.5 %
Basophils Absolute: 41 cells/uL (ref 0–200)
EOS ABS: 138 {cells}/uL (ref 15–500)
Eosinophils Relative: 1.7 %
HCT: 43.8 % (ref 35.0–45.0)
HEMOGLOBIN: 14.7 g/dL (ref 11.7–15.5)
Lymphs Abs: 2252 cells/uL (ref 850–3900)
MCH: 32 pg (ref 27.0–33.0)
MCHC: 33.6 g/dL (ref 32.0–36.0)
MCV: 95.4 fL (ref 80.0–100.0)
MONOS PCT: 7.7 %
MPV: 11.1 fL (ref 7.5–12.5)
NEUTROS ABS: 5046 {cells}/uL (ref 1500–7800)
Neutrophils Relative %: 62.3 %
Platelets: 309 10*3/uL (ref 140–400)
RBC: 4.59 10*6/uL (ref 3.80–5.10)
RDW: 12.5 % (ref 11.0–15.0)
Total Lymphocyte: 27.8 %
WBC mixed population: 624 cells/uL (ref 200–950)
WBC: 8.1 10*3/uL (ref 3.8–10.8)

## 2018-02-18 LAB — LIPID PANEL
CHOLESTEROL: 264 mg/dL — AB (ref <200)
HDL: 117 mg/dL (ref >50)
LDL CHOLESTEROL (CALC): 132 mg/dL — AB
Non-HDL Cholesterol (Calc): 147 mg/dL (calc) — ABNORMAL HIGH (ref <130)
Total CHOL/HDL Ratio: 2.3 (calc) (ref <5.0)
Triglycerides: 63 mg/dL (ref <150)

## 2018-02-18 LAB — URINALYSIS, ROUTINE W REFLEX MICROSCOPIC
Bilirubin Urine: NEGATIVE
Glucose, UA: NEGATIVE
HGB URINE DIPSTICK: NEGATIVE
Ketones, ur: NEGATIVE
Leukocytes, UA: NEGATIVE
NITRITE: NEGATIVE
Protein, ur: NEGATIVE
Specific Gravity, Urine: 1.007 (ref 1.001–1.03)
pH: 6.5 (ref 5.0–8.0)

## 2018-02-18 LAB — MICROALBUMIN / CREATININE URINE RATIO: CREATININE, URINE: 8 mg/dL — AB (ref 20–275)

## 2018-02-18 LAB — COMPLETE METABOLIC PANEL WITH GFR
AG Ratio: 1.6 (calc) (ref 1.0–2.5)
ALKALINE PHOSPHATASE (APISO): 108 U/L (ref 33–130)
ALT: 38 U/L — ABNORMAL HIGH (ref 6–29)
AST: 35 U/L (ref 10–35)
Albumin: 4.6 g/dL (ref 3.6–5.1)
BUN: 15 mg/dL (ref 7–25)
CO2: 32 mmol/L (ref 20–32)
CREATININE: 0.89 mg/dL (ref 0.50–0.99)
Calcium: 10.5 mg/dL — ABNORMAL HIGH (ref 8.6–10.4)
Chloride: 102 mmol/L (ref 98–110)
GFR, Est African American: 78 mL/min/{1.73_m2} (ref >=60)
GFR, Est Non African American: 68 mL/min/{1.73_m2} (ref >=60)
GLOBULIN: 2.8 g/dL (ref 1.9–3.7)
GLUCOSE: 92 mg/dL (ref 65–99)
Potassium: 4.4 mmol/L (ref 3.5–5.3)
SODIUM: 142 mmol/L (ref 135–146)
Total Bilirubin: 0.6 mg/dL (ref 0.2–1.2)
Total Protein: 7.4 g/dL (ref 6.1–8.1)

## 2018-02-18 LAB — HEMOGLOBIN A1C
Hgb A1c MFr Bld: 5.4 % of total Hgb (ref <5.7)
Mean Plasma Glucose: 108 (calc)
eAG (mmol/L): 6 (calc)

## 2018-02-18 LAB — TSH: TSH: 1.99 m[IU]/L (ref 0.40–4.50)

## 2018-02-18 LAB — INSULIN, RANDOM: INSULIN: 5.2 u[IU]/mL (ref 2.0–19.6)

## 2018-02-18 LAB — MAGNESIUM: MAGNESIUM: 2.3 mg/dL (ref 1.5–2.5)

## 2018-02-18 LAB — VITAMIN D 25 HYDROXY (VIT D DEFICIENCY, FRACTURES): VIT D 25 HYDROXY: 69 ng/mL (ref 30–100)

## 2018-02-18 MED ORDER — ATORVASTATIN CALCIUM 80 MG PO TABS: ORAL_TABLET | ORAL | 1 refills | Status: DC

## 2018-02-20 ENCOUNTER — Encounter: Payer: Self-pay | Admitting: *Deleted

## 2018-03-14 DIAGNOSIS — Z23 Encounter for immunization: Secondary | ICD-10-CM | POA: Diagnosis not present

## 2018-05-23 NOTE — Progress Notes (Signed)
MEDICARE ANNUAL WELLNESS VISIT AND 3MFU  Assessment:     Encounter for Medicare annual wellness exam  Essential hypertension - continue medications, DASH diet, exercise and monitor at home. Call if greater than 130/80.   Raynaud's disease without gangrene Continue norvasc, stay warm  Osteopenia, unspecified location Osteopenia- get dexa with , continue Vit D and Ca, weight bearing exercises  DDD (degenerative disc disease), lumbar monitor  Hyperlipidemia, unspecified hyperlipidemia type  Newly on atorvastatin 40 mg every other day, does endorse some mild myalgias in AM since starting; on review she is typically very well controlled excepting at last most recent check; discussed if LDL <70, will cut way back on statin - consider atorvastatin 40 mg just once weekly, or 10 mg every other day  -check lipids, decrease fatty foods, increase activity.  -     Lipid panel  Depression, major, in remission ([place]) - continue medications, stress management techniques discussed, increase water, good sleep hygiene discussed, increase exercise, and increase veggies.   Allergic state, subsequent encounter - Allegra OTC, increase H20, allergy hygiene explained.  Stress incontinence monitor  Other abnormal glucose Recent A1Cs at goal Discussed diet/exercise, weight management  Defer A1C; check CMP  Vitamin D deficiency Continue supplement   Over 40 minutes of exam, counseling, chart review and critical decision making was performed Future Appointments  Date Time Provider [place]  08/26/2018  2:30 PM Unk Pinto, MD [facility] None  02/19/2019  2:00 PM Unk Pinto, MD [facility] None   Plan:   During the course of the visit the patient was educated and counseled about appropriate screening and preventive services including:    Pneumococcal vaccine   Prevnar 13  Influenza vaccine  Td vaccine  Screening electrocardiogram  Bone densitometry  screening  Colorectal cancer screening  Diabetes screening  Glaucoma screening  Nutrition counseling   Advanced directives: requested   Subjective:  Alicia Gates is a 67 y.o. female who presents for Medicare Annual Wellness Visit and follow up on htn, hyperlipidemia  She is on estrogen, s/p hysterectomy, on bASA.  Follows with Dr. Allyson Sabal once a year.   BMI is Body mass index is 22.68 kg/m., she has been working on diet and exercise, runs 40-50 min three days a week.  Wt Readings from Last 3 Encounters:  05/24/18 122 lb (55.3 kg)  02/15/18 120 lb 6.4 oz (54.6 kg)  12/07/16 124 lb (56.2 kg)   Her blood pressure has been controlled at home, today their BP is BP: 110/68  She does workout. She denies chest pain, shortness of breath, dizziness.   She is on cholesterol medication (newly on atorvastatin 40 mg every other day) and does endorse mild AM myalgias. Her cholesterol is not at goal. The cholesterol last visit was:   Lab Results  Component Value Date   CHOL 264 (H) 02/15/2018   HDL 117 02/15/2018   LDLCALC 132 (H) 02/15/2018   TRIG 63 02/15/2018   CHOLHDL 2.3 02/15/2018    She has been working on diet and exercise for glucose management, and denies paresthesia of the feet, polydipsia, polyuria and visual disturbances. Last A1C in the office was:  Lab Results  Component Value Date   HGBA1C 5.4 02/15/2018   Patient is on Vitamin D supplement.   Lab Results  Component Value Date   VD25OH 69 02/15/2018     Lab Results  Component Value Date   GFRNONAA 68 02/15/2018      Medication Review: Current Outpatient Medications on File  Prior to Visit  Medication Sig Dispense Refill  . amLODipine (NORVASC) 2.5 MG tablet TAKE 1 TABLET (2.5 MG TOTAL) BY MOUTH DAILY. 90 tablet 1  . atorvastatin (LIPITOR) 80 MG tablet Take 1/2 to 1 tablet daily for Cholesterol or as directed 90 tablet 1  . azelastine (ASTELIN) 0.1 % nasal spray Place 2 sprays into both nostrils 2 (two) times  daily. Use in each nostril as directed 30 mL 12  . BABY ASPIRIN PO Take 81 mg by mouth daily.    Marland Kitchen BIOTIN PO Take 10,000 mg by mouth daily.    . Calcium Carbonate-Vitamin D (CALCIUM + D PO) Take 600 mg by mouth daily.     . Cetirizine HCl (ZYRTEC ALLERGY PO) Take by mouth.    . Cholecalciferol (VITAMIN D PO) Take 2,000 Int'l Units by mouth 2 (two) times daily.     Marland Kitchen CINNAMON PO Take 1,000 mg by mouth daily.    Marland Kitchen estradiol (ESTRACE) 1 MG tablet TAKE 1 TABLET (1 MG TOTAL) BY MOUTH DAILY. 90 tablet 1  . Multiple Vitamins-Minerals (MULTIVITAMIN PO) Take by mouth. With calcium    . tretinoin (RETIN-A) 0.1 % cream APPLICATION THIN LAYER TO FACE AT BEDTIME WASH OFF EACH AM  5  . triamcinolone cream (KENALOG) 0.1 % APPLY TO AFFECTED AREA TWICE A DAY AS NEEDED  2  . vitamin C (ASCORBIC ACID) 500 MG tablet Take 500 mg by mouth daily.     No current facility-administered medications on file prior to visit.     Allergies  Allergen Reactions  . Codeine     Current Problems (verified) Patient Active Problem List   Diagnosis Date Noted  . Medication management 01/04/2017  . Other abnormal glucose 11/22/2015  . Stress incontinence 11/13/2014  . Hyperlipidemia   . Hypertension   . Depression, major, in remission ([place])   . Allergy   . Osteopenia   . Raynaud disease   . DDD (degenerative disc disease), lumbar     Screening Tests Immunization History  Administered Date(s) Administered  . Influenza-Unspecified 03/14/2018  . Pneumococcal Conjugate-13 12/07/2016  . Pneumococcal-Unspecified 02/13/2003  . Tdap 09/29/2011  . Varicella Zoster Immune Globulin 11/22/2015  . Zoster Recombinat (Shingrix) 03/14/2018, 05/20/2018   Tetanus: 2013 Pneumovax: 2004 with a reaction locally, out in office  Prevnar 13: 2018 Flu vaccine: 2019  Zostavax: 2017  Pap: 11/2015 normal, does not need another MGM: 02/2018 hx of U/S left breast benign DEXA: May 2016 T -1.2 Osteopenia will wait 3 years, on estrace  - PT PREF ORDER AT [facility] TO GET W MMG Colonoscopy: 07/2014, 2 polyps removed, Dr. Sundra Aland, due 2021 EGD: N/A CXR 08/2008  Names of Other Physician/Practitioners you currently use: 1. [facility] here for primary care 2. Dr. Clydene Laming, eye doctor, last visit 2019 3. Dr. Noah Charon, dentist 2019  Patient Care Team: Unk Pinto, MD as PCP - General (Internal Medicine) Suella Broad, MD as Consulting Physician (Physical Medicine and Rehabilitation) Rockey Situ, Kathlene November, MD as Consulting Physician (Cardiology) Ronald Lobo, MD as Consulting Physician (Gastroenterology) Druscilla Brownie, MD as Consulting Physician (Dermatology)  SURGICAL HISTORY She  has a past surgical history that includes Abdominal hysterectomy and Rhinoplasty. FAMILY HISTORY Her family history includes Arthritis in her father and mother; Cancer in her father; Diabetes in her mother; Hypertension in her father and mother; Macular degeneration in her father; Stroke in her father and mother. SOCIAL HISTORY She  reports that she has never smoked. She has never  used smokeless tobacco. She reports current alcohol use. She reports that she does not use drugs.   MEDICARE WELLNESS OBJECTIVES: Physical activity: Current Exercise Habits: Home exercise routine, Type of exercise: Other - see comments(running), Time (Minutes): 50, Frequency (Times/Week): 3, Weekly Exercise (Minutes/Week): 150, Intensity: Mild, Exercise limited by: None identified Cardiac risk factors: Cardiac Risk Factors include: dyslipidemia;advanced age (>77men, >42 women);hypertension Depression/mood screen:   Depression screen [facility] 2/9 05/24/2018  Decreased Interest 0  Down, Depressed, Hopeless 0  PHQ - 2 Score 0    ADLs:  In your present state of health, do you have any difficulty performing the following activities: 05/24/2018 02/17/2018  Hearing? N N  Vision? N N  Difficulty concentrating or making decisions?  N N  Walking or climbing stairs? N N  Dressing or bathing? N N  Doing errands, shopping? N -  Some recent data might be hidden     Cognitive Testing  Alert? Yes  Normal Appearance?Yes  Oriented to person? Yes  Place? Yes   Time? Yes  Recall of three objects?  Yes  Can perform simple calculations? Yes  Displays appropriate judgment?Yes  Can read the correct time from a watch face?Yes  EOL planning: Does Patient Have a Medical Advance Directive?: Yes Type of Advance Directive: Healthcare Power of Attorney, Living will Does patient want to make changes to medical advance directive?: No - Patient declined Copy of [place] in Chart?: No - copy requested  Review of Systems  Constitutional: Negative.  Negative for malaise/fatigue and weight loss.  HENT: Negative.  Negative for hearing loss and tinnitus.   Eyes: Negative.  Negative for blurred vision and double vision.  Respiratory: Negative.  Negative for cough, sputum production, shortness of breath and wheezing.   Cardiovascular: Negative.  Negative for chest pain, palpitations, orthopnea, claudication, leg swelling and PND.  Gastrointestinal: Negative.  Negative for abdominal pain, blood in stool, constipation, diarrhea, heartburn, melena, nausea and vomiting.  Genitourinary: Negative.   Musculoskeletal: Negative.  Negative for falls, joint pain and myalgias.  Skin: Negative.  Negative for rash.  Neurological: Negative for dizziness, tingling, sensory change, weakness and headaches.  Endo/Heme/Allergies: Negative for polydipsia.  Psychiatric/Behavioral: Negative.  Negative for depression, memory loss, substance abuse and suicidal ideas. The patient is not nervous/anxious and does not have insomnia.   All other systems reviewed and are negative.    Objective:     Today's Vitals   05/24/18 1122  BP: 110/68  Pulse: 60  Temp: (!) 97.3 F (36.3 C)  SpO2: 99%  Weight: 122 lb (55.3 kg)  Height: 5' 1.5" (1.562  m)   Body mass index is 22.68 kg/m.  General appearance: alert, no distress, WD/WN, female HEENT: normocephalic, sclerae anicteric, TMs pearly, nares patent, no discharge or erythema, pharynx normal Oral cavity: MMM, no lesions Neck: supple, no lymphadenopathy, no thyromegaly, no masses Heart: RRR, normal S1, S2, no murmurs Lungs: CTA bilaterally, no wheezes, rhonchi, or rales Abdomen: +bs, soft, non tender, non distended, no masses, no hepatomegaly, no splenomegaly Musculoskeletal: nontender, no swelling, no obvious deformity Extremities: no edema, no cyanosis, no clubbing Pulses: 2+ symmetric, upper and lower extremities, normal cap refill Neurological: alert, oriented x 3, CN2-12 intact, strength normal upper extremities and lower extremities, sensation normal throughout, DTRs 2+ throughout, no cerebellar signs, gait normal Psychiatric: normal affect, behavior normal, pleasant   Medicare Attestation I have personally reviewed: The patient's medical and social history Their use of alcohol, tobacco or illicit drugs Their current  medications and supplements The patient's functional ability including ADLs,fall risks, home safety risks, cognitive, and hearing and visual impairment Diet and physical activities Evidence for depression or mood disorders  The patient's weight, height, BMI, and visual acuity have been recorded in the chart.  I have made referrals, counseling, and provided education to the patient based on review of the above and I have provided the patient with a written personalized care plan for preventive services.     Izora Ribas, NP   05/24/2018

## 2018-05-24 ENCOUNTER — Encounter: Payer: Self-pay | Admitting: Adult Health

## 2018-05-24 ENCOUNTER — Ambulatory Visit (INDEPENDENT_AMBULATORY_CARE_PROVIDER_SITE_OTHER): Payer: Medicare Other | Admitting: Adult Health

## 2018-05-24 VITALS — BP 110/68 | HR 60 | Temp 97.3°F | Ht 61.5 in | Wt 122.0 lb

## 2018-05-24 DIAGNOSIS — N393 Stress incontinence (female) (male): Secondary | ICD-10-CM | POA: Diagnosis not present

## 2018-05-24 DIAGNOSIS — E785 Hyperlipidemia, unspecified: Secondary | ICD-10-CM | POA: Diagnosis not present

## 2018-05-24 DIAGNOSIS — Z6822 Body mass index (BMI) 22.0-22.9, adult: Secondary | ICD-10-CM

## 2018-05-24 DIAGNOSIS — R6889 Other general symptoms and signs: Secondary | ICD-10-CM

## 2018-05-24 DIAGNOSIS — F325 Major depressive disorder, single episode, in full remission: Secondary | ICD-10-CM

## 2018-05-24 DIAGNOSIS — E782 Mixed hyperlipidemia: Secondary | ICD-10-CM

## 2018-05-24 DIAGNOSIS — M5136 Other intervertebral disc degeneration, lumbar region: Secondary | ICD-10-CM | POA: Diagnosis not present

## 2018-05-24 DIAGNOSIS — I7300 Raynaud's syndrome without gangrene: Secondary | ICD-10-CM

## 2018-05-24 DIAGNOSIS — M8580 Other specified disorders of bone density and structure, unspecified site: Secondary | ICD-10-CM

## 2018-05-24 DIAGNOSIS — R7309 Other abnormal glucose: Secondary | ICD-10-CM | POA: Diagnosis not present

## 2018-05-24 DIAGNOSIS — Z0001 Encounter for general adult medical examination with abnormal findings: Secondary | ICD-10-CM | POA: Diagnosis not present

## 2018-05-24 DIAGNOSIS — Z79899 Other long term (current) drug therapy: Secondary | ICD-10-CM | POA: Diagnosis not present

## 2018-05-24 DIAGNOSIS — Z0000 Encounter for general adult medical examination without abnormal findings: Secondary | ICD-10-CM

## 2018-05-24 DIAGNOSIS — I10 Essential (primary) hypertension: Secondary | ICD-10-CM

## 2018-05-24 DIAGNOSIS — T7840XD Allergy, unspecified, subsequent encounter: Secondary | ICD-10-CM

## 2018-05-24 NOTE — Patient Instructions (Addendum)
  Alicia Gates , Thank you for taking time to come for your Medicare Wellness Visit. I appreciate your ongoing commitment to your health goals. Please review the following plan we discussed and let me know if I can assist you in the future.   These are the goals we discussed: Goals    . Blood Pressure < 130/80    . LDL CALC < 100       This is a list of the screening recommended for you and due dates:  Health Maintenance  Topic Date Due  . Pneumonia vaccines (2 of 2 - PPSV23) 06/24/2018*  . Colon Cancer Screening  08/01/2019  . Mammogram  02/16/2020  . Tetanus Vaccine  09/28/2021  . Flu Shot  Completed  . DEXA scan (bone density measurement)  Completed  .  Hepatitis C: One time screening is recommended by Center for Disease Control  (CDC) for  adults born from [birth year] through 1964.   Completed  *Topic was postponed. The date shown is not the original due date.    Know what a healthy weight is for you (roughly BMI <25) and aim to maintain this  Aim for 7+ servings of fruits and vegetables daily  65-80+ fluid ounces of water or unsweet tea for healthy kidneys  Limit to max 1 drink of alcohol per day; avoid smoking/tobacco  Limit animal fats in diet for cholesterol and heart health - choose grass fed whenever available  Avoid highly processed foods, and foods high in saturated/trans fats  Aim for low stress - take time to unwind and care for your mental health  Aim for 150 min of moderate intensity exercise weekly for heart health, and weights twice weekly for bone health  Aim for 7-9 hours of sleep daily

## 2018-05-25 LAB — COMPLETE METABOLIC PANEL WITH GFR
AG Ratio: 1.5 (calc) (ref 1.0–2.5)
ALBUMIN MSPROF: 4.2 g/dL (ref 3.6–5.1)
ALKALINE PHOSPHATASE (APISO): 104 U/L (ref 33–130)
ALT: 25 U/L (ref 6–29)
AST: 27 U/L (ref 10–35)
BILIRUBIN TOTAL: 0.6 mg/dL (ref 0.2–1.2)
BUN: 13 mg/dL (ref 7–25)
CHLORIDE: 106 mmol/L (ref 98–110)
CO2: 28 mmol/L (ref 20–32)
Calcium: 9.7 mg/dL (ref 8.6–10.4)
Creat: 0.85 mg/dL (ref 0.50–0.99)
GFR, Est African American: 83 mL/min/{1.73_m2} (ref >=60)
GFR, Est Non African American: 71 mL/min/{1.73_m2} (ref >=60)
GLOBULIN: 2.8 g/dL (ref 1.9–3.7)
Glucose, Bld: 88 mg/dL (ref 65–99)
Potassium: 4.8 mmol/L (ref 3.5–5.3)
Sodium: 142 mmol/L (ref 135–146)
Total Protein: 7 g/dL (ref 6.1–8.1)

## 2018-05-25 LAB — LIPID PANEL
CHOL/HDL RATIO: 1.7 (calc) (ref <5.0)
CHOLESTEROL: 177 mg/dL (ref <200)
HDL: 107 mg/dL (ref >50)
LDL Cholesterol (Calc): 57 mg/dL (calc)
Non-HDL Cholesterol (Calc): 70 mg/dL (calc) (ref <130)
Triglycerides: 51 mg/dL (ref <150)

## 2018-05-26 ENCOUNTER — Other Ambulatory Visit: Payer: Self-pay | Admitting: Adult Health

## 2018-05-26 DIAGNOSIS — E782 Mixed hyperlipidemia: Secondary | ICD-10-CM

## 2018-05-26 MED ORDER — ATORVASTATIN CALCIUM 40 MG PO TABS: ORAL_TABLET | ORAL | 1 refills | Status: DC

## 2018-08-01 ENCOUNTER — Other Ambulatory Visit: Payer: Self-pay | Admitting: Physician Assistant

## 2018-08-01 ENCOUNTER — Other Ambulatory Visit: Payer: Self-pay | Admitting: Internal Medicine

## 2018-08-01 DIAGNOSIS — M8580 Other specified disorders of bone density and structure, unspecified site: Secondary | ICD-10-CM

## 2018-08-25 ENCOUNTER — Encounter: Payer: Self-pay | Admitting: Internal Medicine

## 2018-08-25 NOTE — Patient Instructions (Signed)

## 2018-08-25 NOTE — Progress Notes (Signed)
                                                                                                                                                                                                  R E  S  C  H  E  D  U  L  E  D                              This very nice 68 y.o. MWF  presents for 3 month follow up with HTN, HLD, Pre-Diabetes and Vitamin D Deficiency.      Patient is treated for HTN (2015) & BP has been controlled at home. Today's  . Patient has had no complaints of any cardiac type chest pain, palpitations, dyspnea / orthopnea / PND, dizziness, claudication, or dependent edema.     Hyperlipidemia is controlled with diet & meds. Patient denies myalgias or other med SE's. Last Lipids were at goal: Lab Results  Component Value Date   CHOL 177 05/24/2018   HDL 107 05/24/2018   LDLCALC 57 05/24/2018   TRIG 51 05/24/2018   CHOLHDL 1.7 05/24/2018      Also, the patient has history of PreDiabetes  (A1c 5.7% / 2015 and 5.9% / 2017)and has had no symptoms of reactive hypoglycemia, diabetic polys, paresthesias or visual blurring.  Last A1c was Normal & at goal: Lab Results  Component Value Date   HGBA1C 5.4 02/15/2018      Further, the patient also has history of Vitamin D Deficiency and supplements vitamin D without any suspected side-effects. Last vitamin D was at goal:  Lab Results  Component Value Date   VD25OH 69 02/15/2018

## 2018-08-26 ENCOUNTER — Ambulatory Visit: Payer: Self-pay | Admitting: Internal Medicine

## 2018-10-03 ENCOUNTER — Other Ambulatory Visit: Payer: Self-pay

## 2018-10-03 ENCOUNTER — Encounter: Payer: Self-pay | Admitting: Internal Medicine

## 2018-10-03 ENCOUNTER — Ambulatory Visit (INDEPENDENT_AMBULATORY_CARE_PROVIDER_SITE_OTHER): Payer: Medicare Other | Admitting: Internal Medicine

## 2018-10-03 VITALS — BP 112/74 | HR 56 | Temp 97.0°F | Resp 16 | Ht 61.5 in | Wt 124.8 lb

## 2018-10-03 DIAGNOSIS — I10 Essential (primary) hypertension: Secondary | ICD-10-CM

## 2018-10-03 DIAGNOSIS — E559 Vitamin D deficiency, unspecified: Secondary | ICD-10-CM | POA: Diagnosis not present

## 2018-10-03 DIAGNOSIS — E782 Mixed hyperlipidemia: Secondary | ICD-10-CM

## 2018-10-03 DIAGNOSIS — R7309 Other abnormal glucose: Secondary | ICD-10-CM | POA: Diagnosis not present

## 2018-10-03 DIAGNOSIS — Z79899 Other long term (current) drug therapy: Secondary | ICD-10-CM

## 2018-10-03 NOTE — Progress Notes (Signed)
History of Present Illness:      This very nice 68 y.o.  MWF presents for 6 month follow up with HTN, HLD, Pre-Diabetes and Vitamin D Deficiency.       Patient is treated for HTN & BP has been controlled at home. Today's BP is at goal - 112/74. Patient has had no complaints of any cardiac type chest pain, palpitations, dyspnea / orthopnea / PND, dizziness, claudication, or dependent edema.      Hyperlipidemia is controlled with diet & Atorvastatin Patient denies myalgias or other med SE's. Last Lipids were at goal: Lab Results  Component Value Date   CHOL 177 05/24/2018   HDL 107 05/24/2018   LDLCALC 57 05/24/2018   TRIG 51 05/24/2018   CHOLHDL 1.7 05/24/2018        Also, the patient has history of PreDiabetes and has had no symptoms of reactive hypoglycemia, diabetic polys, paresthesias or visual blurring.  Last A1c was at goal: Lab Results  Component Value Date   HGBA1C 5.4 02/15/2018       Further, the patient also has history of Vitamin D Deficiency and supplements vitamin D without any suspected side-effects. Last vitamin D was at goal: Lab Results  Component Value Date   VD25OH 69 02/15/2018   Current Outpatient Medications on File Prior to Visit  Medication Sig  . amLODipine (NORVASC) 2.5 MG tablet TAKE 1 TABLET DAILY  . atorvastatin (LIPITOR) 40 MG tablet Take 40 mg by mouth in the evening twice a week for cholesterol.  Marland Kitchen azelastine (ASTELIN) 0.1 % nasal spray Place 2 sprays into both nostrils 2 (two) times daily. Use in each nostril as directed  . BABY ASPIRIN PO Take 81 mg by mouth daily.  Marland Kitchen BIOTIN PO Take 10,000 mg by mouth daily.  . Calcium Carbonate-Vitamin D (CALCIUM + D PO) Take 600 mg by mouth daily.   . Cetirizine HCl (ZYRTEC ALLERGY PO) Take by mouth.  . Cholecalciferol (VITAMIN D PO) Take 2,000 Int'l Units by mouth 2 (two) times daily.   Marland Kitchen CINNAMON PO Take 1,000 mg by mouth daily.  Marland Kitchen estradiol (ESTRACE) 1 MG tablet TAKE 1 TABLET DAILY  . Multiple  Vitamins-Minerals (MULTIVITAMIN PO) Take by mouth. With calcium  . tretinoin (RETIN-A) 0.1 % cream APPLICATION THIN LAYER TO FACE AT BEDTIME WASH OFF EACH AM  . triamcinolone cream (KENALOG) 0.1 % APPLY TO AFFECTED AREA TWICE A DAY AS NEEDED  . vitamin C (ASCORBIC ACID) 500 MG tablet Take 500 mg by mouth daily.   No current facility-administered medications on file prior to visit.    Allergies  Allergen Reactions  . Codeine    PMHx:   Past Medical History:  Diagnosis Date  . Allergy   . DDD (degenerative disc disease), lumbar   . Depression   . Hyperlipidemia   . Hypertension   . Osteopenia   . Raynaud disease 2010   Immunization History  Administered Date(s) Administered  . Influenza-Unspecified 03/14/2018  . Pneumococcal Conjugate-13 12/07/2016  . Pneumococcal-Unspecified 02/13/2003  . Tdap 09/29/2011  . Varicella Zoster Immune Globulin 11/22/2015  . Zoster Recombinat (Shingrix) 03/14/2018, 05/20/2018   Past Surgical History:  Procedure Laterality Date  . ABDOMINAL HYSTERECTOMY    . RHINOPLASTY     FHx:    Reviewed / unchanged  SHx:    Reviewed / unchanged   Systems Review:  Constitutional: Denies fever, chills, wt changes, headaches, insomnia, fatigue, night sweats, change in appetite. Eyes: Denies redness, blurred vision, diplopia,  discharge, itchy, watery eyes.  ENT: Denies discharge, congestion, post nasal drip, epistaxis, sore throat, earache, hearing loss, dental pain, tinnitus, vertigo, sinus pain, snoring.  CV: Denies chest pain, palpitations, irregular heartbeat, syncope, dyspnea, diaphoresis, orthopnea, PND, claudication or edema. Respiratory: denies cough, dyspnea, DOE, pleurisy, hoarseness, laryngitis, wheezing.  Gastrointestinal: Denies dysphagia, odynophagia, heartburn, reflux, water brash, abdominal pain or cramps, nausea, vomiting, bloating, diarrhea, constipation, hematemesis, melena, hematochezia  or hemorrhoids. Genitourinary: Denies dysuria,  frequency, urgency, nocturia, hesitancy, discharge, hematuria or flank pain. Musculoskeletal: Denies arthralgias, myalgias, stiffness, jt. swelling, pain, limping or strain/sprain.  Skin: Denies pruritus, rash, hives, warts, acne, eczema or change in skin lesion(s). Neuro: No weakness, tremor, incoordination, spasms, paresthesia or pain. Psychiatric: Denies confusion, memory loss or sensory loss. Endo: Denies change in weight, skin or hair change.  Heme/Lymph: No excessive bleeding, bruising or enlarged lymph nodes.  Physical Exam  BP 112/74   Pulse (!) 56   Temp (!) 97 F (36.1 C)   Resp 16   Ht 5' 1.5" (1.562 m)   Wt 124 lb 12.8 oz (56.6 kg)   BMI 23.20 kg/m   Appears  well nourished, well groomed  and in no distress.  Eyes: PERRLA, EOMs, conjunctiva no swelling or erythema. Sinuses: No frontal/maxillary tenderness ENT/Mouth: EAC's clear, TM's nl w/o erythema, bulging. Nares clear w/o erythema, swelling, exudates. Oropharynx clear without erythema or exudates. Oral hygiene is good. Tongue normal, non obstructing. Hearing intact.  Neck: Supple. Thyroid not palpable. Car 2+/2+ without bruits, nodes or JVD. Chest: Respirations nl with BS clear & equal w/o rales, rhonchi, wheezing or stridor.  Cor: Heart sounds normal w/ regular rate and rhythm without sig. murmurs, gallops, clicks or rubs. Peripheral pulses normal and equal  without edema.  Abdomen: Soft & bowel sounds normal. Non-tender w/o guarding, rebound, hernias, masses or organomegaly.  Lymphatics: Unremarkable.  Musculoskeletal: Full ROM all peripheral extremities, joint stability, 5/5 strength and normal gait.  Skin: Warm, dry without exposed rashes, lesions or ecchymosis apparent.  Neuro: Cranial nerves intact, reflexes equal bilaterally. Sensory-motor testing grossly intact. Tendon reflexes grossly intact.  Pysch: Alert & oriented x 3.  Insight and judgement nl & appropriate. No ideations.  Assessment and Plan:  1.  Essential hypertension  - Continue medication, monitor blood pressure at home.  - Continue DASH diet.  Reminder to go to the ER if any CP,  SOB, nausea, dizziness, severe HA, changes vision/speech.  - TSH - Magnesium - COMPLETE METABOLIC PANEL WITH GFR - CBC with Differential/Platelet  2. Hyperlipidemia, mixed  - Continue diet/meds, exercise,& lifestyle modifications.  - Continue monitor periodic cholesterol/liver & renal functions   - TSH - Lipid panel  3. Abnormal glucose  - Continue diet, exercise  - Lifestyle modifications.  - Monitor appropriate labs.  - Insulin, random - Hemoglobin A1c  4. Vitamin D deficiency  - Continue supplementation.   - VITAMIN D 25 Hydroxy   5. Medication management - VITAMIN D 25 Hydroxy  - Insulin, random - Hemoglobin A1c - TSH - Lipid panel - Magnesium - COMPLETE METABOLIC PANEL WITH GFR - CBC with Differential/Platelet      Discussed  regular exercise, BP monitoring, weight control to achieve/maintain BMI less than 25 and discussed med and SE's. Recommended labs to assess and monitor clinical status with further disposition pending results of labs. I discussed the assessment and treatment plan with the patient. The patient was provided an opportunity to ask questions and all were answered. The patient agreed with the  plan and demonstrated an understanding of the instructions.  I  provided  over 40 minutes of exam, counseling, chart review and  complex critical decision making was performed.    Kirtland Bouchard, MD

## 2018-10-03 NOTE — Patient Instructions (Signed)

## 2018-10-04 ENCOUNTER — Encounter: Payer: Self-pay | Admitting: Internal Medicine

## 2018-10-04 LAB — LIPID PANEL
Cholesterol: 202 mg/dL — ABNORMAL HIGH (ref <200)
HDL: 112 mg/dL (ref >=50)
LDL Cholesterol (Calc): 77 mg/dL (calc)
Non-HDL Cholesterol (Calc): 90 mg/dL (calc) (ref <130)
Total CHOL/HDL Ratio: 1.8 (calc) (ref <5.0)
Triglycerides: 53 mg/dL (ref <150)

## 2018-10-04 LAB — COMPLETE METABOLIC PANEL WITH GFR
AG Ratio: 2 (calc) (ref 1.0–2.5)
ALT: 16 U/L (ref 6–29)
AST: 22 U/L (ref 10–35)
Albumin: 4.4 g/dL (ref 3.6–5.1)
Alkaline phosphatase (APISO): 95 U/L (ref 37–153)
BUN: 19 mg/dL (ref 7–25)
CO2: 30 mmol/L (ref 20–32)
Calcium: 9.7 mg/dL (ref 8.6–10.4)
Chloride: 104 mmol/L (ref 98–110)
Creat: 0.88 mg/dL (ref 0.50–0.99)
GFR, Est African American: 79 mL/min/{1.73_m2} (ref >=60)
GFR, Est Non African American: 68 mL/min/{1.73_m2} (ref >=60)
Globulin: 2.2 g/dL (calc) (ref 1.9–3.7)
Glucose, Bld: 94 mg/dL (ref 65–99)
Potassium: 4.6 mmol/L (ref 3.5–5.3)
Sodium: 139 mmol/L (ref 135–146)
Total Bilirubin: 0.8 mg/dL (ref 0.2–1.2)
Total Protein: 6.6 g/dL (ref 6.1–8.1)

## 2018-10-04 LAB — CBC WITH DIFFERENTIAL/PLATELET
Absolute Monocytes: 531 cells/uL (ref 200–950)
Basophils Absolute: 31 cells/uL (ref 0–200)
Basophils Relative: 0.5 %
Eosinophils Absolute: 159 cells/uL (ref 15–500)
Eosinophils Relative: 2.6 %
HCT: 41.6 % (ref 35.0–45.0)
Hemoglobin: 13.9 g/dL (ref 11.7–15.5)
Lymphs Abs: 2086 cells/uL (ref 850–3900)
MCH: 32.4 pg (ref 27.0–33.0)
MCHC: 33.4 g/dL (ref 32.0–36.0)
MCV: 97 fL (ref 80.0–100.0)
MPV: 11.1 fL (ref 7.5–12.5)
Monocytes Relative: 8.7 %
Neutro Abs: 3294 cells/uL (ref 1500–7800)
Neutrophils Relative %: 54 %
Platelets: 267 10*3/uL (ref 140–400)
RBC: 4.29 10*6/uL (ref 3.80–5.10)
RDW: 12.7 % (ref 11.0–15.0)
Total Lymphocyte: 34.2 %
WBC: 6.1 10*3/uL (ref 3.8–10.8)

## 2018-10-04 LAB — HEMOGLOBIN A1C
Hgb A1c MFr Bld: 5.3 % of total Hgb (ref <5.7)
Mean Plasma Glucose: 105 (calc)
eAG (mmol/L): 5.8 (calc)

## 2018-10-04 LAB — VITAMIN D 25 HYDROXY (VIT D DEFICIENCY, FRACTURES): Vit D, 25-Hydroxy: 47 ng/mL (ref 30–100)

## 2018-10-04 LAB — TSH: TSH: 1.55 mIU/L (ref 0.40–4.50)

## 2018-10-04 LAB — INSULIN, RANDOM: Insulin: 3.4 u[IU]/mL

## 2018-10-04 LAB — MAGNESIUM: Magnesium: 2.2 mg/dL (ref 1.5–2.5)

## 2018-10-07 ENCOUNTER — Encounter: Payer: Self-pay | Admitting: *Deleted

## 2018-12-23 DIAGNOSIS — M79642 Pain in left hand: Secondary | ICD-10-CM | POA: Diagnosis not present

## 2019-01-06 ENCOUNTER — Ambulatory Visit: Payer: Medicare Other | Admitting: Podiatry

## 2019-01-06 DIAGNOSIS — M79642 Pain in left hand: Secondary | ICD-10-CM | POA: Diagnosis not present

## 2019-01-06 DIAGNOSIS — M79672 Pain in left foot: Secondary | ICD-10-CM | POA: Diagnosis not present

## 2019-01-06 NOTE — Progress Notes (Deleted)
FOLLOW UP  Assessment and Plan:   Hypertension -Continue medication, monitor blood pressure at home. Continue DASH diet.  Reminder to go to the ER if any CP, SOB, nausea, dizziness, severe HA, changes vision/speech, left arm numbness and tingling and jaw pain.  Cholesterol -Continue diet and exercise. Check cholesterol.    Prediabetes  -Continue diet and exercise. Check A1C  Vitamin D Def - check level and continue medications.   Continue diet and meds as discussed. Further disposition pending results of labs. Over 30 minutes of exam, counseling, chart review, and critical decision making was performed  Future Appointments  Date Time Provider Mabel  01/06/2019  4:15 PM Wallene Huh, [place] [facility]  01/09/2019  9:30 AM Vicie Mutters, PA-C [facility] None  04/15/2019  9:00 AM Unk Pinto, MD [facility] None  06/03/2019 11:15 AM Liane Comber, NP [facility] None     HPI 68 y.o. female  presents for 3 month follow up on hypertension, cholesterol, prediabetes, and vitamin D deficiency.   Her blood pressure {HAS HAS NOT:18834} been controlled at home, today their BP is     She {DOES_DOES TDD:22025} workout. She denies chest pain, shortness of breath, dizziness.   She  {ACTION; IS/IS KYH:06237628}  on cholesterol medication and denies myalgias. Her cholesterol {ACTION; IS/IS NOT:21021397} at goal. The cholesterol last visit was:     She {Has/has not:18111} been working on diet and exercise for prediabetes, and denies {Symptoms; diabetes w/o none:19199}. Last A1C in the office was:  Lab Results  Component Value Date   HGBA1C 5.3 10/03/2018    Patient is on Vitamin D supplement.   Lab Results  Component Value Date   VD25OH 47 10/03/2018         Current Medications:  Current Outpatient Medications on File Prior to Visit  Medication Sig  . amLODipine (NORVASC) 2.5 MG tablet TAKE 1 TABLET DAILY  . atorvastatin (LIPITOR) 40 MG tablet Take 40 mg  by mouth in the evening twice a week for cholesterol.  Marland Kitchen azelastine (ASTELIN) 0.1 % nasal spray Place 2 sprays into both nostrils 2 (two) times daily. Use in each nostril as directed  . BABY ASPIRIN PO Take 81 mg by mouth daily.  Marland Kitchen BIOTIN PO Take 10,000 mg by mouth daily.  . Calcium Carbonate-Vitamin D (CALCIUM + D PO) Take 600 mg by mouth daily.   . Cetirizine HCl (ZYRTEC ALLERGY PO) Take by mouth.  . Cholecalciferol (VITAMIN D PO) Take 2,000 Int'l Units by mouth 2 (two) times daily.   Marland Kitchen CINNAMON PO Take 1,000 mg by mouth daily.  Marland Kitchen estradiol (ESTRACE) 1 MG tablet TAKE 1 TABLET DAILY  . Multiple Vitamins-Minerals (MULTIVITAMIN PO) Take by mouth. With calcium  . tretinoin (RETIN-A) 0.1 % cream APPLICATION THIN LAYER TO FACE AT BEDTIME WASH OFF EACH AM  . triamcinolone cream (KENALOG) 0.1 % APPLY TO AFFECTED AREA TWICE A DAY AS NEEDED  . vitamin C (ASCORBIC ACID) 500 MG tablet Take 500 mg by mouth daily.   No current facility-administered medications on file prior to visit.     Medical History:  Past Medical History:  Diagnosis Date  . Allergy   . DDD (degenerative disc disease), lumbar   . Depression   . Hyperlipidemia   . Hypertension   . Osteopenia   . Raynaud disease 2010   Allergies:  Allergies  Allergen Reactions  . Codeine      Review of Systems:  ROS  Family history- Review and unchanged Social  history- Review and unchanged Physical Exam: There were no vitals taken for this visit. Wt Readings from Last 3 Encounters:  10/03/18 124 lb 12.8 oz (56.6 kg)  05/24/18 122 lb (55.3 kg)  02/15/18 120 lb 6.4 oz (54.6 kg)   General Appearance: Well nourished, in no apparent distress. Eyes: PERRLA, EOMs, conjunctiva no swelling or erythema Sinuses: No Frontal/maxillary tenderness ENT/Mouth: Ext aud canals clear, TMs without erythema, bulging. No erythema, swelling, or exudate on post pharynx.  Tonsils not swollen or erythematous. Hearing normal.  Neck: Supple, thyroid  normal.  Respiratory: Respiratory effort normal, BS equal bilaterally without rales, rhonchi, wheezing or stridor.  Cardio: RRR with no MRGs. Brisk peripheral pulses without edema.  Abdomen: Soft, + BS,  Non tender, no guarding, rebound, hernias, masses. Lymphatics: Non tender without lymphadenopathy.  Musculoskeletal: Full ROM, 5/5 strength, {PSY - GAIT AND STATION:22860} gait Skin: Warm, dry without rashes, lesions, ecchymosis.  Neuro: Cranial nerves intact. Normal muscle tone, no cerebellar symptoms. Psych: Awake and oriented X 3, normal affect, Insight and Judgment appropriate.    Vicie Mutters, PA-C 8:18 AM [facility]

## 2019-01-09 ENCOUNTER — Ambulatory Visit: Payer: Medicare Other | Admitting: Physician Assistant

## 2019-01-16 ENCOUNTER — Ambulatory Visit: Payer: Medicare Other | Admitting: Physician Assistant

## 2019-02-14 DIAGNOSIS — M79672 Pain in left foot: Secondary | ICD-10-CM | POA: Diagnosis not present

## 2019-02-19 ENCOUNTER — Encounter: Payer: Self-pay | Admitting: Internal Medicine

## 2019-03-04 DIAGNOSIS — Z23 Encounter for immunization: Secondary | ICD-10-CM | POA: Diagnosis not present

## 2019-03-06 DIAGNOSIS — C44521 Squamous cell carcinoma of skin of breast: Secondary | ICD-10-CM | POA: Diagnosis not present

## 2019-03-06 DIAGNOSIS — L821 Other seborrheic keratosis: Secondary | ICD-10-CM | POA: Diagnosis not present

## 2019-03-06 DIAGNOSIS — D1801 Hemangioma of skin and subcutaneous tissue: Secondary | ICD-10-CM | POA: Diagnosis not present

## 2019-03-06 DIAGNOSIS — L570 Actinic keratosis: Secondary | ICD-10-CM | POA: Diagnosis not present

## 2019-03-06 DIAGNOSIS — D485 Neoplasm of uncertain behavior of skin: Secondary | ICD-10-CM | POA: Diagnosis not present

## 2019-03-06 DIAGNOSIS — L308 Other specified dermatitis: Secondary | ICD-10-CM | POA: Diagnosis not present

## 2019-03-06 DIAGNOSIS — D225 Melanocytic nevi of trunk: Secondary | ICD-10-CM | POA: Diagnosis not present

## 2019-03-06 DIAGNOSIS — L814 Other melanin hyperpigmentation: Secondary | ICD-10-CM | POA: Diagnosis not present

## 2019-03-06 DIAGNOSIS — L988 Other specified disorders of the skin and subcutaneous tissue: Secondary | ICD-10-CM | POA: Diagnosis not present

## 2019-03-12 DIAGNOSIS — C44529 Squamous cell carcinoma of skin of other part of trunk: Secondary | ICD-10-CM | POA: Diagnosis not present

## 2019-04-02 ENCOUNTER — Other Ambulatory Visit: Payer: Self-pay

## 2019-04-02 ENCOUNTER — Ambulatory Visit (INDEPENDENT_AMBULATORY_CARE_PROVIDER_SITE_OTHER): Payer: Medicare Other

## 2019-04-02 ENCOUNTER — Ambulatory Visit (INDEPENDENT_AMBULATORY_CARE_PROVIDER_SITE_OTHER): Payer: Medicare Other | Admitting: Podiatry

## 2019-04-02 ENCOUNTER — Encounter: Payer: Self-pay | Admitting: Podiatry

## 2019-04-02 DIAGNOSIS — M778 Other enthesopathies, not elsewhere classified: Secondary | ICD-10-CM

## 2019-04-02 DIAGNOSIS — M722 Plantar fascial fibromatosis: Secondary | ICD-10-CM

## 2019-04-04 NOTE — Progress Notes (Signed)
Subjective:   Patient ID: Alicia Gates, female   DOB: 68 y.o.   MRN: [id number]   HPI Patient presents stating she is having a lot of problems with her left foot and states it is hurting as much on the top now as it is on the bottom with 2 separate problems   ROS      Objective:  Physical Exam  Neurovascular status was found to be intact muscle strength was adequate with patient found to have moderate extensor tendinitis left across the entire dorsal surface and has inflammation pain of the plantar fascial left which at this point seems to be more intense     Assessment:  Plantar fasciitis left with inflammation along with extensor tendinitis with the plantar fascial appearing to be more acute     Plan:  H&P reviewed both conditions and today I get a focus on the plantar heel and I injected the fascia 3 mg Kenalog 5 mg Xylocaine and I then went ahead discussed extensor tendinitis and utilization of topical medications.  Patient will begin aggressive shoe gear usage and will be seen back as symptoms indicate  X-rays were negative for signs of fracture with spur formation noted and no indication of midfoot arthritis

## 2019-04-14 ENCOUNTER — Encounter: Payer: Self-pay | Admitting: Internal Medicine

## 2019-04-14 NOTE — Progress Notes (Signed)
Comprehensive Evaluation &  Examination     This very nice 68 y.o. MWF presents for a  comprehensive evaluation and management of multiple medical co-morbidities.  Patient has been followed for HTN, HLD, Prediabetes  and Vitamin D Deficiency.      Labile HTN predates since 2001, altho patient was not started on treatment until 2009. Patient's BP has been controlled at home and patient denies any cardiac symptoms as chest pain, palpitations, shortness of breath, dizziness or ankle swelling. Today's BP is at goal - 126/84.      Patient's hyperlipidemia is controlled with diet and Atorvastatin. Patient denies myalgias or other medication SE's. Last lipids were at goal:  Lab Results  Component Value Date   CHOL 202 (H) 10/03/2018   HDL 112 10/03/2018   LDLCALC 77 10/03/2018   TRIG 53 10/03/2018   CHOLHDL 1.8 10/03/2018      Patient has hx/o prediabetes (A1c 5.7%  / 2015 and 5.9% / 2016)  and patient denies reactive hypoglycemic symptoms, visual blurring, diabetic polys or paresthesias. Last A1c was Normal & at goal:  Lab Results  Component Value Date   HGBA1C 5.3 10/03/2018      Finally, patient has history of Vitamin D Deficiency ("27" / 2009)  and last Vitamin D was still slightly low (goal 70-100):  Lab Results  Component Value Date   VD25OH 47 10/03/2018   Current Outpatient Medications on File Prior to Visit  Medication Sig  . amLODipine (NORVASC) 2.5 MG tablet TAKE 1 TABLET DAILY  . atorvastatin (LIPITOR) 40 MG tablet Take 40 mg by mouth in the evening twice a week for cholesterol.  Marland Kitchen azelastine (ASTELIN) 0.1 % nasal spray Place 2 sprays into both nostrils 2 (two) times daily. Use in each nostril as directed  . BABY ASPIRIN PO Take 81 mg by mouth daily.  Marland Kitchen BIOTIN PO Take 10,000 mg by mouth daily.  . Calcium Carbonate-Vitamin D (CALCIUM + D PO) Take 600 mg by mouth daily.   . Cetirizine HCl (ZYRTEC ALLERGY PO) Take by mouth.  . Cholecalciferol (VITAMIN D PO) Take 2,000 Int'l  Units by mouth 2 (two) times daily.   Marland Kitchen CINNAMON PO Take 1,000 mg by mouth daily.  Marland Kitchen estradiol (ESTRACE) 1 MG tablet TAKE 1 TABLET DAILY  . Multiple Vitamins-Minerals (MULTIVITAMIN PO) Take by mouth. With calcium  . tretinoin (RETIN-A) 0.1 % cream APPLICATION THIN LAYER TO FACE AT BEDTIME WASH OFF EACH AM  . triamcinolone cream (KENALOG) 0.1 % APPLY TO AFFECTED AREA TWICE A DAY AS NEEDED  . vitamin C (ASCORBIC ACID) 500 MG tablet Take 500 mg by mouth daily.   No current facility-administered medications on file prior to visit.    Allergies  Allergen Reactions  . Codeine    Past Medical History:  Diagnosis Date  . Allergy   . DDD (degenerative disc disease), lumbar   . Depression   . Hyperlipidemia   . Hypertension   . Osteopenia   . Raynaud disease 2010   Health Maintenance  Topic Date Due  . COLONOSCOPY  08/01/2019  . MAMMOGRAM  02/16/2020  . TETANUS/TDAP  09/28/2021  . INFLUENZA VACCINE  Completed  . DEXA SCAN  Completed  . Hepatitis C Screening  Completed  . PNA vac Low Risk Adult  Completed   Immunization History  Administered Date(s) Administered  . Influenza Inj Mdck Quad Pf 03/04/2019  . Influenza-Unspecified 03/14/2018  . Pneumococcal Conjugate-13 12/07/2016  . Pneumococcal Polysaccharide-23 03/04/2019  . Pneumococcal-Unspecified  02/13/2003  . Tdap 09/29/2011  . Varicella Zoster Immune Globulin 11/22/2015  . Zoster Recombinat (Shingrix) 03/14/2018, 05/20/2018   Last Colon - 07/31/2014 - Dr Romilda Garret - recc 5 yr f/u due Mar 2021  Last MGM - 02/15/2018  Past Surgical History:  Procedure Laterality Date  . ABDOMINAL HYSTERECTOMY    . RHINOPLASTY     Family History  Problem Relation Age of Onset  . Diabetes Mother   . Hypertension Mother   . Arthritis Mother   . Stroke Mother        Dec 2016  . Arthritis Father        PMR  . Cancer Father        colon  . Hypertension Father   . Macular degeneration Father   . Stroke Father        due to TA    Social History   Tobacco Use  . Smoking status: Never Smoker  . Smokeless tobacco: Never Used  Substance Use Topics  . Alcohol use: Yes    Comment: Rare  . Drug use: No    ROS Constitutional: Denies fever, chills, weight loss/gain, headaches, insomnia,  night sweats, and change in appetite. Does c/o fatigue. Eyes: Denies redness, blurred vision, diplopia, discharge, itchy, watery eyes.  ENT: Denies discharge, congestion, post nasal drip, epistaxis, sore throat, earache, hearing loss, dental pain, Tinnitus, Vertigo, Sinus pain, snoring.  Cardio: Denies chest pain, palpitations, irregular heartbeat, syncope, dyspnea, diaphoresis, orthopnea, PND, claudication, edema Respiratory: denies cough, dyspnea, DOE, pleurisy, hoarseness, laryngitis, wheezing.  Gastrointestinal: Denies dysphagia, heartburn, reflux, water brash, pain, cramps, nausea, vomiting, bloating, diarrhea, constipation, hematemesis, melena, hematochezia, jaundice, hemorrhoids Genitourinary: Denies dysuria, frequency, urgency, nocturia, hesitancy, discharge, hematuria, flank pain Breast: Breast lumps, nipple discharge, bleeding.  Musculoskeletal: Denies arthralgia, myalgia, stiffness, Jt. Swelling, pain, limp, and strain/sprain. Denies falls. Skin: Denies puritis, rash, hives, warts, acne, eczema, changing in skin lesion Neuro: No weakness, tremor, incoordination, spasms, paresthesia, pain Psychiatric: Denies confusion, memory loss, sensory loss. Denies Depression. Endocrine: Denies change in weight, skin, hair change, nocturia, and paresthesia, diabetic polys, visual blurring, hyper / hypo glycemic episodes.  Heme/Lymph: No excessive bleeding, bruising, enlarged lymph nodes.  Physical Exam  BP 126/84   Pulse 64   Temp 97.9 F (36.6 C)   Resp 16   Ht 5\' 1"  (1.549 m)   Wt 124 lb (56.2 kg)   BMI 23.43 kg/m   General Appearance: Well nourished, well groomed and in no apparent distress.  Eyes: PERRLA, EOMs, conjunctiva no  swelling or erythema, normal fundi and vessels. Sinuses: No frontal/maxillary tenderness ENT/Mouth: EACs patent / TMs  nl. Nares clear without erythema, swelling, mucoid exudates. Oral hygiene is good. No erythema, swelling, or exudate. Tongue normal, non-obstructing. Tonsils not swollen or erythematous. Hearing normal.  Neck: Supple, thyroid not palpable. No bruits, nodes or JVD. Respiratory: Respiratory effort normal.  BS equal and clear bilateral without rales, rhonci, wheezing or stridor. Cardio: Heart sounds are normal with regular rate and rhythm and no murmurs, rubs or gallops. Peripheral pulses are normal and equal bilaterally without edema. No aortic or femoral bruits. Chest: symmetric with normal excursions and percussion. Breasts: Symmetric, without lumps, nipple discharge, retractions, or fibrocystic changes.  Abdomen: Flat, soft with bowel sounds active. Nontender, no guarding, rebound, hernias, masses, or organomegaly.  Lymphatics: Non tender without lymphadenopathy.  Genitourinary:  Musculoskeletal: Full ROM all peripheral extremities, joint stability, 5/5 strength, and normal gait. Skin: Warm and dry without rashes, lesions, cyanosis, clubbing or  ecchymosis.  Neuro: Cranial nerves intact, reflexes equal bilaterally. Normal muscle tone, no cerebellar symptoms. Sensation intact.  Pysch: Alert and oriented X 3, normal affect, Insight and Judgment appropriate.   Assessment and Plan  1. Essential hypertension  - EKG 12-Lead - Urinalysis, Routine w reflex microscopic - Microalbumin / Creatinine Urine Ratio - CBC with Diff - COMPLETE METABOLIC PANEL WITH GFR - Magnesium - TSH  2. Hyperlipidemia, mixed  - EKG 12-Lead - Lipid Profile - TSH  3. Abnormal glucose  - EKG 12-Lead - Hemoglobin A1c ([facility]) - Insulin, random  4. Vitamin D deficiency  - Vitamin D (25 hydroxy)  5. Screening for colorectal cancer  - POC Hemoccult Bld/Stl  6. Screening for ischemic heart  disease  - EKG 12-Lead  7. FH: hypertension  - EKG 12-Lead  8. Medication management  - Urinalysis, Routine w reflex microscopic - Microalbumin / Creatinine Urine Ratio - CBC with Diff - COMPLETE METABOLIC PANEL WITH GFR - Magnesium - Lipid Profile - TSH - Hemoglobin A1c ([facility]) - Insulin, random - Vitamin D (25 hydroxy)        Patient was counseled in prudent diet to achieve/maintain BMI less than 25 for weight control, BP monitoring, regular exercise and medications. Discussed med's effects and SE's. Screening labs and tests as requested with regular follow-up as recommended. Over 40 minutes of exam, counseling, chart review and high complex critical decision making was performed.   Kirtland Bouchard, MD

## 2019-04-14 NOTE — Patient Instructions (Signed)
Vit D  & Vit C 1,000 mg   are recommended to help protect  against the Covid-19 and other Corona viruses.    Also it's recommended  to take  Zinc 50 mg  to help  protect against the Covid-19   and best place to get  is also on [email or web address]  and don't pay more than 6-8 cents /pill !  ================================ Coronavirus (COVID-19) Are you at risk?  Are you at risk for the Coronavirus (COVID-19)?  To be considered HIGH RISK for Coronavirus (COVID-19), you have to meet the following criteria:  . Traveled to [place], [place], [place], [place] or [place]; or in the [place] to [place], [place], [place]  . or [place]; and have fever, cough, and shortness of breath within the last 2 weeks of travel OR . Been in close contact with a person diagnosed with COVID-19 within the last 2 weeks and have  . fever, cough,and shortness of breath .  . IF YOU DO NOT MEET THESE CRITERIA, YOU ARE CONSIDERED LOW RISK FOR COVID-19.  What to do if you are HIGH RISK for COVID-19?  Marland Kitchen If you are having a medical emergency, call 911. . Seek medical care right away. Before you go to a doctor's office, urgent care or emergency department, .  call ahead and tell them about your recent travel, contact with someone diagnosed with COVID-19  .  and your symptoms.  . You should receive instructions from your physician's office regarding next steps of care.  . When you arrive at healthcare provider, tell the healthcare staff immediately you have returned from  . visiting [place], [place], [place], [place] or [place]; or traveled in the [place] to [place], [place],  . [place] or [place] in the last two weeks or you have been in close contact with a person diagnosed with  . COVID-19 in the last 2 weeks.   . Tell the health care staff about your symptoms: fever, cough and shortness of breath. . After you have been seen by a medical provider, you will be either: o Tested for (COVID-19) and  discharged home on quarantine except to seek medical care if  o symptoms worsen, and asked to  - Stay home and avoid contact with others until you get your results (4-5 days)  - Avoid travel on public transportation if possible (such as bus, train, or airplane) or o Sent to the Emergency Department by EMS for evaluation, COVID-19 testing  and  o possible admission depending on your condition and test results.  What to do if you are LOW RISK for COVID-19?  Reduce your risk of any infection by using the same precautions used for avoiding the common cold or flu:  Marland Kitchen Wash your hands often with soap and warm water for at least 20 seconds.  If soap and water are not readily available,  . use an alcohol-based hand sanitizer with at least 60% alcohol.  . If coughing or sneezing, cover your mouth and nose by coughing or sneezing into the elbow areas of your shirt or coat, .  into a tissue or into your sleeve (not your hands). . Avoid shaking hands with others and consider head nods or verbal greetings only. . Avoid touching your eyes, nose, or mouth with unwashed hands.  . Avoid close contact with people who are sick. . Avoid places or events with large numbers of people in one location, like concerts or sporting events. Marland Kitchen  Carefully consider travel plans you have or are making. . If you are planning any travel outside or inside the [place], visit the CDC's Travelers' Health webpage for the latest health notices. . If you have some symptoms but not all symptoms, continue to monitor at home and seek medical attention  . if your symptoms worsen. . If you are having a medical emergency, call 911.   . >>>>>>>>>>>>>>>>>>>>>>>>>>>>>>>>> . We Do NOT Approve of  [facility], [employer]  Our Patients  To Do Home Visits & We Do NOT Approve of [employer] > > > > > > > > > > > > > > > > > > > > > > > > > > > > > > > > > > > > > > >  Preventive Care for Adults  A healthy lifestyle  and preventive care can promote health and wellness. Preventive health guidelines for women include the following key practices.  A routine yearly physical is a good way to check with your health care provider about your health and preventive screening. It is a chance to share any concerns and updates on your health and to receive a thorough exam.  Visit your dentist for a routine exam and preventive care every 6 months. Brush your teeth twice a day and floss once a day. Good oral hygiene prevents tooth decay and gum disease.  The frequency of eye exams is based on your age, health, family medical history, use of contact lenses, and other factors. Follow your health care provider's recommendations for frequency of eye exams.  Eat a healthy diet. Foods like vegetables, fruits, whole grains, low-fat dairy products, and lean protein foods contain the nutrients you need without too many calories. Decrease your intake of foods high in solid fats, added sugars, and salt. Eat the right amount of calories for you. Get information about a proper diet from your health care provider, if necessary.  Regular physical exercise is one of the most important things you can do for your health. Most adults should get at least 150 minutes of moderate-intensity exercise (any activity that increases your heart rate and causes you to sweat) each week. In addition, most adults need muscle-strengthening exercises on 2 or more days a week.  Maintain a healthy weight. The body mass index (BMI) is a screening tool to identify possible weight problems. It provides an estimate of body fat based on height and weight. Your health care provider can find your BMI and can help you achieve or maintain a healthy weight. For adults 20 years and older:  A BMI below 18.5 is considered underweight.  A BMI of 18.5 to 24.9 is normal.  A BMI of 25 to 29.9 is considered overweight.  A BMI of 30 and above is considered obese.  Maintain  normal blood lipids and cholesterol levels by exercising and minimizing your intake of saturated fat. Eat a balanced diet with plenty of fruit and vegetables. If your lipid or cholesterol levels are high, you are over 50, or you are at high risk for heart disease, you may need your cholesterol levels checked more frequently. Ongoing high lipid and cholesterol levels should be treated with medicines if diet and exercise are not working.  If you smoke, find out from your health care provider how to quit. If you do not use tobacco, do not start.  Lung cancer screening is recommended for adults aged 62-80 years who are at high risk for developing lung cancer  because of a history of smoking. A yearly low-dose CT scan of the lungs is recommended for people who have at least a 30-pack-year history of smoking and are a current smoker or have quit within the past 15 years. A pack year of smoking is smoking an average of 1 pack of cigarettes a day for 1 year (for example: 1 pack a day for 30 years or 2 packs a day for 15 years). Yearly screening should continue until the smoker has stopped smoking for at least 15 years. Yearly screening should be stopped for people who develop a health problem that would prevent them from having lung cancer treatment.  Avoid use of street drugs. Do not share needles with anyone. Ask for help if you need support or instructions about stopping the use of drugs.  High blood pressure causes heart disease and increases the risk of stroke.  Ongoing high blood pressure should be treated with medicines if weight loss and exercise do not work.  If you are 20-66 years old, ask your health care provider if you should take aspirin to prevent strokes.  Diabetes screening involves taking a blood sample to check your fasting blood sugar level. This should be done once every 3 years, after age 97, if you are within normal weight and without risk factors for diabetes. Testing should be considered  at a younger age or be carried out more frequently if you are overweight and have at least 1 risk factor for diabetes.  Breast cancer screening is essential preventive care for women. You should practice "breast self-awareness." This means understanding the normal appearance and feel of your breasts and may include breast self-examination. Any changes detected, no matter how small, should be reported to a health care provider. Women in their 53s and 30s should have a clinical breast exam (CBE) by a health care provider as part of a regular health exam every 1 to 3 years. After age 37, women should have a CBE every year. Starting at age 41, women should consider having a mammogram (breast X-ray test) every year. Women who have a family history of breast cancer should talk to their health care provider about genetic screening. Women at a high risk of breast cancer should talk to their health care providers about having an MRI and a mammogram every year.  Breast cancer gene (BRCA)-related cancer risk assessment is recommended for women who have family members with BRCA-related cancers. BRCA-related cancers include breast, ovarian, tubal, and peritoneal cancers. Having family members with these cancers may be associated with an increased risk for harmful changes (mutations) in the breast cancer genes BRCA1 and BRCA2. Results of the assessment will determine the need for genetic counseling and BRCA1 and BRCA2 testing.  Routine pelvic exams to screen for cancer are no longer recommended for nonpregnant women who are considered low risk for cancer of the pelvic organs (ovaries, uterus, and vagina) and who do not have symptoms. Ask your health care provider if a screening pelvic exam is right for you.  If you have had past treatment for cervical cancer or a condition that could lead to cancer, you need Pap tests and screening for cancer for at least 20 years after your treatment. If Pap tests have been discontinued,  your risk factors (such as having a new sexual partner) need to be reassessed to determine if screening should be resumed. Some women have medical problems that increase the chance of getting cervical cancer. In these cases, your health care  provider may recommend more frequent screening and Pap tests.    Colorectal cancer can be detected and often prevented. Most routine colorectal cancer screening begins at the age of 6 years and continues through age 22 years. However, your health care provider may recommend screening at an earlier age if you have risk factors for colon cancer. On a yearly basis, your health care provider may provide home test kits to check for hidden blood in the stool. Use of a small camera at the end of a tube, to directly examine the colon (sigmoidoscopy or colonoscopy), can detect the earliest forms of colorectal cancer. Talk to your health care provider about this at age 64, when routine screening begins.  Direct exam of the colon should be repeated every 5-10 years through age 25 years, unless early forms of pre-cancerous polyps or small growths are found.  Osteoporosis is a disease in which the bones lose minerals and strength with aging. This can result in serious bone fractures or breaks. The risk of osteoporosis can be identified using a bone density scan. Women ages 24 years and over and women at risk for fractures or osteoporosis should discuss screening with their health care providers. Ask your health care provider whether you should take a calcium supplement or vitamin D to reduce the rate of osteoporosis.  Menopause can be associated with physical symptoms and risks. Hormone replacement therapy is available to decrease symptoms and risks. You should talk to your health care provider about whether hormone replacement therapy is right for you.  Use sunscreen. Apply sunscreen liberally and repeatedly throughout the day. You should seek shade when your shadow is shorter  than you. Protect yourself by wearing long sleeves, pants, a wide-brimmed hat, and sunglasses year round, whenever you are outdoors.  Once a month, do a whole body skin exam, using a mirror to look at the skin on your back. Tell your health care provider of new moles, moles that have irregular borders, moles that are larger than a pencil eraser, or moles that have changed in shape or color.  Stay current with required vaccines (immunizations).  Influenza vaccine. All adults should be immunized every year.  Tetanus, diphtheria, and acellular pertussis (Td, Tdap) vaccine. Pregnant women should receive 1 dose of Tdap vaccine during each pregnancy. The dose should be obtained regardless of the length of time since the last dose. Immunization is preferred during the 27th-36th week of gestation. An adult who has not previously received Tdap or who does not know her vaccine status should receive 1 dose of Tdap. This initial dose should be followed by tetanus and diphtheria toxoids (Td) booster doses every 10 years. Adults with an unknown or incomplete history of completing a 3-dose immunization series with Td-containing vaccines should begin or complete a primary immunization series including a Tdap dose. Adults should receive a Td booster every 10 years.    Zoster vaccine. One dose is recommended for adults aged 38 years or older unless certain conditions are present.    Pneumococcal 13-valent conjugate (PCV13) vaccine. When indicated, a person who is uncertain of her immunization history and has no record of immunization should receive the PCV13 vaccine. An adult aged 65 years or older who has certain medical conditions and has not been previously immunized should receive 1 dose of PCV13 vaccine. This PCV13 should be followed with a dose of pneumococcal polysaccharide (PPSV23) vaccine. The PPSV23 vaccine dose should be obtained at least 1 or more year(s) after the dose of  PCV13 vaccine. An adult aged 85  years or older who has certain medical conditions and previously received 1 or more doses of PPSV23 vaccine should receive 1 dose of PCV13. The PCV13 vaccine dose should be obtained 1 or more years after the last PPSV23 vaccine dose.    Pneumococcal polysaccharide (PPSV23) vaccine. When PCV13 is also indicated, PCV13 should be obtained first. All adults aged 107 years and older should be immunized. An adult younger than age 61 years who has certain medical conditions should be immunized. Any person who resides in a nursing home or long-term care facility should be immunized. An adult smoker should be immunized. People with an immunocompromised condition and certain other conditions should receive both PCV13 and PPSV23 vaccines. People with human immunodeficiency virus (HIV) infection should be immunized as soon as possible after diagnosis. Immunization during chemotherapy or radiation therapy should be avoided. Routine use of PPSV23 vaccine is not recommended for American Indians, [place] Natives, or people younger than 65 years unless there are medical conditions that require PPSV23 vaccine. When indicated, people who have unknown immunization and have no record of immunization should receive PPSV23 vaccine. One-time revaccination 5 years after the first dose of PPSV23 is recommended for people aged 19-64 years who have chronic kidney failure, nephrotic syndrome, asplenia, or immunocompromised conditions. People who received 1-2 doses of PPSV23 before age 80 years should receive another dose of PPSV23 vaccine at age 40 years or later if at least 5 years have passed since the previous dose. Doses of PPSV23 are not needed for people immunized with PPSV23 at or after age 92 years.   Preventive Services / Frequency  Ages 62 years and over  Blood pressure check.  Lipid and cholesterol check.  Lung cancer screening. / Every year if you are aged 30-80 years and have a 30-pack-year history of smoking and  currently smoke or have quit within the past 15 years. Yearly screening is stopped once you have quit smoking for at least 15 years or develop a health problem that would prevent you from having lung cancer treatment.  Clinical breast exam.** / Every year after age 46 years.   BRCA-related cancer risk assessment.** / For women who have family members with a BRCA-related cancer (breast, ovarian, tubal, or peritoneal cancers).  Mammogram.** / Every year beginning at age 68 years and continuing for as long as you are in good health. Consult with your health care provider.  Pap test.** / Every 3 years starting at age 72 years through age 11 or 83 years with 3 consecutive normal Pap tests. Testing can be stopped between 65 and 70 years with 3 consecutive normal Pap tests and no abnormal Pap or HPV tests in the past 10 years.  Fecal occult blood test (FOBT) of stool. / Every year beginning at age 70 years and continuing until age 54 years. You may not need to do this test if you get a colonoscopy every 10 years.  Flexible sigmoidoscopy or colonoscopy.** / Every 5 years for a flexible sigmoidoscopy or every 10 years for a colonoscopy beginning at age 74 years and continuing until age 62 years.  Hepatitis C blood test.** / For all people born from [birth year] through 1964 and any individual with known risks for hepatitis C.  Osteoporosis screening.** / A one-time screening for women ages 69 years and over and women at risk for fractures or osteoporosis.  Skin self-exam. / Monthly.  Influenza vaccine. / Every year.  Tetanus, diphtheria, and acellular  pertussis (Tdap/Td) vaccine.** / 1 dose of Td every 10 years.  Zoster vaccine.** / 1 dose for adults aged 60 years or older.  Pneumococcal 13-valent conjugate (PCV13) vaccine.** / Consult your health care provider.  Pneumococcal polysaccharide (PPSV23) vaccine.** / 1 dose for all adults aged 65 years and older. Screening for abdominal aortic aneurysm (AAA)   by ultrasound is recommended for people who have history of high blood pressure or who are current or former smokers. ++++++++++++++++++++ Recommend Adult Low Dose Aspirin or  coated  Aspirin 81 mg daily  To reduce risk of Colon Cancer 40 %,  Skin Cancer 26 % ,  Melanoma 46%  and  Pancreatic cancer 60% ++++++++++++++++++++ Vitamin D goal  is between 70-100.  Please make sure that you are taking your Vitamin D as directed.  It is very important as a natural anti-inflammatory  helping hair, skin, and nails, as well as reducing stroke and heart attack risk.  It helps your bones and helps with mood. It also decreases numerous cancer risks so please take it as directed.  Low Vit D is associated with a 200-300% higher risk for CANCER  and 200-300% higher risk for HEART   ATTACK  &  STROKE.   ...................................... It is also associated with higher death rate at younger ages,  autoimmune diseases like Rheumatoid arthritis, Lupus, Multiple Sclerosis.    Also many other serious conditions, like depression, Alzheimer's Dementia, infertility, muscle aches, fatigue, fibromyalgia - just to name a few. ++++++++++++++++++ Recommend the book "The END of DIETING" by Dr Joel Fuhrman  & the book "The END of DIABETES " by Dr Joel Fuhrman At [employer]com - get book & Audio CD's    Being diabetic has a  300% increased risk for heart attack, stroke, cancer, and alzheimer- type vascular dementia. It is very important that you work harder with diet by avoiding all foods that are white. Avoid white rice (brown & wild rice is OK), white potatoes (sweetpotatoes in moderation is OK), White bread or wheat bread or anything made out of white flour like bagels, donuts, rolls, buns, biscuits, cakes, pastries, cookies, pizza crust, and pasta (made from white flour & egg whites) - vegetarian pasta or spinach or wheat pasta is OK. Multigrain breads like Arnold's or [employer], or multigrain sandwich thins  or flatbreads.  Diet, exercise and weight loss can reverse and cure diabetes in the early stages.  Diet, exercise and weight loss is very important in the control and prevention of complications of diabetes which affects every system in your body, ie. Brain - dementia/stroke, eyes - glaucoma/blindness, heart - heart attack/heart failure, kidneys - dialysis, stomach - gastric paralysis, intestines - malabsorption, nerves - severe painful neuritis, circulation - gangrene & loss of a leg(s), and finally cancer and Alzheimers.    I recommend avoid fried & greasy foods,  sweets/candy, white rice (brown or wild rice or Quinoa is OK), white potatoes (sweet potatoes are OK) - anything made from white flour - bagels, doughnuts, rolls, buns, biscuits,white and wheat breads, pizza crust and traditional pasta made of white flour & egg white(vegetarian pasta or spinach or wheat pasta is OK).  Multi-grain bread is OK - like multi-grain flat bread or sandwich thins. Avoid alcohol in excess. Exercise is also important.    Eat all the vegetables you want - avoid meat, especially red meat and dairy - especially cheese.  Cheese is the most concentrated form of trans-fats which is the worst thing to clog   up our arteries. Veggie cheese is OK which can be found in the fresh produce section at [employer] or [employer] or [employer]  +++++++++++++++++++ DASH Eating Plan  DASH stands for "Dietary Approaches to Stop Hypertension."   The DASH eating plan is a healthy eating plan that has been shown to reduce high blood pressure (hypertension). Additional health benefits may include reducing the risk of type 2 diabetes mellitus, heart disease, and stroke. The DASH eating plan may also help with weight loss. WHAT DO I NEED TO KNOW ABOUT THE DASH EATING PLAN? For the DASH eating plan, you will follow these general guidelines:  Choose foods with a percent daily value for sodium of less than 5% (as listed on the food  label).  Use salt-free seasonings or herbs instead of table salt or sea salt.  Check with your health care provider or pharmacist before using salt substitutes.  Eat lower-sodium products, often labeled as "lower sodium" or "no salt added."  Eat fresh foods.  Eat more vegetables, fruits, and low-fat dairy products.  Choose whole grains. Look for the word "whole" as the first word in the ingredient list.  Choose fish   Limit sweets, desserts, sugars, and sugary drinks.  Choose heart-healthy fats.  Eat veggie cheese   Eat more home-cooked food and less restaurant, buffet, and fast food.  Limit fried foods.  Cook foods using methods other than frying.  Limit canned vegetables. If you do use them, rinse them well to decrease the sodium.  When eating at a restaurant, ask that your food be prepared with less salt, or no salt if possible.                      WHAT FOODS CAN I EAT? Read Dr Fara Olden Fuhrman's books on The End of Dieting & The End of Diabetes  Grains Whole grain or whole wheat bread. Brown rice. Whole grain or whole wheat pasta. Quinoa, bulgur, and whole grain cereals. Low-sodium cereals. Corn or whole wheat flour tortillas. Whole grain cornbread. Whole grain crackers. Low-sodium crackers.  Vegetables Fresh or frozen vegetables (raw, steamed, roasted, or grilled). Low-sodium or reduced-sodium tomato and vegetable juices. Low-sodium or reduced-sodium tomato sauce and paste. Low-sodium or reduced-sodium canned vegetables.   Fruits All fresh, canned (in natural juice), or frozen fruits.  Protein Products  All fish and seafood.  Dried beans, peas, or lentils. Unsalted nuts and seeds. Unsalted canned beans.  Dairy Low-fat dairy products, such as skim or 1% milk, 2% or reduced-fat cheeses, low-fat ricotta or cottage cheese, or plain low-fat yogurt. Low-sodium or reduced-sodium cheeses.  Fats and Oils Tub margarines without trans fats. Light or reduced-fat mayonnaise  and salad dressings (reduced sodium). Avocado. Safflower, olive, or canola oils. Natural peanut or almond butter.  Other Unsalted popcorn and pretzels. The items listed above may not be a complete list of recommended foods or beverages. Contact your dietitian for more options.  +++++++++++++++  WHAT FOODS ARE NOT RECOMMENDED? Grains/ White flour or wheat flour White bread. White pasta. White rice. Refined cornbread. Bagels and croissants. Crackers that contain trans fat.  Vegetables  Creamed or fried vegetables. Vegetables in a . Regular canned vegetables. Regular canned tomato sauce and paste. Regular tomato and vegetable juices.  Fruits Dried fruits. Canned fruit in light or heavy syrup. Fruit juice.  Meat and Other Protein Products Meat in general - RED meat & White meat.  Fatty cuts of meat. Ribs, chicken wings, all processed meats as  bacon, sausage, bologna, salami, fatback, hot dogs, bratwurst and packaged luncheon meats.  Dairy Whole or 2% milk, cream, half-and-half, and cream cheese. Whole-fat or sweetened yogurt. Full-fat cheeses or blue cheese. Non-dairy creamers and whipped toppings. Processed cheese, cheese spreads, or cheese curds.  Condiments Onion and garlic salt, seasoned salt, table salt, and sea salt. Canned and packaged gravies. Worcestershire sauce. Tartar sauce. Barbecue sauce. Teriyaki sauce. Soy sauce, including reduced sodium. Steak sauce. Fish sauce. Oyster sauce. Cocktail sauce. Horseradish. Ketchup and mustard. Meat flavorings and tenderizers. Bouillon cubes. Hot sauce. Tabasco sauce. Marinades. Taco seasonings. Relishes.  Fats and Oils Butter, stick margarine, lard, shortening and bacon fat. Coconut, palm kernel, or palm oils. Regular salad dressings.  Pickles and olives. Salted popcorn and pretzels.  The items listed above may not be a complete list of foods and beverages to avoid.

## 2019-04-15 ENCOUNTER — Other Ambulatory Visit: Payer: Self-pay

## 2019-04-15 ENCOUNTER — Ambulatory Visit (INDEPENDENT_AMBULATORY_CARE_PROVIDER_SITE_OTHER): Payer: Medicare Other | Admitting: Internal Medicine

## 2019-04-15 VITALS — BP 126/84 | HR 64 | Temp 97.9°F | Resp 16 | Ht 61.0 in | Wt 124.0 lb

## 2019-04-15 DIAGNOSIS — Z136 Encounter for screening for cardiovascular disorders: Secondary | ICD-10-CM

## 2019-04-15 DIAGNOSIS — E782 Mixed hyperlipidemia: Secondary | ICD-10-CM

## 2019-04-15 DIAGNOSIS — Z79899 Other long term (current) drug therapy: Secondary | ICD-10-CM | POA: Diagnosis not present

## 2019-04-15 DIAGNOSIS — I10 Essential (primary) hypertension: Secondary | ICD-10-CM | POA: Diagnosis not present

## 2019-04-15 DIAGNOSIS — R7309 Other abnormal glucose: Secondary | ICD-10-CM | POA: Diagnosis not present

## 2019-04-15 DIAGNOSIS — E559 Vitamin D deficiency, unspecified: Secondary | ICD-10-CM | POA: Diagnosis not present

## 2019-04-15 DIAGNOSIS — Z1211 Encounter for screening for malignant neoplasm of colon: Secondary | ICD-10-CM

## 2019-04-15 DIAGNOSIS — Z8249 Family history of ischemic heart disease and other diseases of the circulatory system: Secondary | ICD-10-CM

## 2019-04-16 ENCOUNTER — Ambulatory Visit: Payer: Medicare Other | Admitting: Podiatry

## 2019-04-16 LAB — CBC WITH DIFFERENTIAL/PLATELET
Absolute Monocytes: 583 cells/uL (ref 200–950)
Basophils Absolute: 40 cells/uL (ref 0–200)
Basophils Relative: 0.6 %
Eosinophils Absolute: 188 cells/uL (ref 15–500)
Eosinophils Relative: 2.8 %
HCT: 41.3 % (ref 35.0–45.0)
Hemoglobin: 13.7 g/dL (ref 11.7–15.5)
Lymphs Abs: 2131 cells/uL (ref 850–3900)
MCH: 31.6 pg (ref 27.0–33.0)
MCHC: 33.2 g/dL (ref 32.0–36.0)
MCV: 95.4 fL (ref 80.0–100.0)
MPV: 10.7 fL (ref 7.5–12.5)
Monocytes Relative: 8.7 %
Neutro Abs: 3759 cells/uL (ref 1500–7800)
Neutrophils Relative %: 56.1 %
Platelets: 265 10*3/uL (ref 140–400)
RBC: 4.33 10*6/uL (ref 3.80–5.10)
RDW: 12.1 % (ref 11.0–15.0)
Total Lymphocyte: 31.8 %
WBC: 6.7 10*3/uL (ref 3.8–10.8)

## 2019-04-16 LAB — MICROALBUMIN / CREATININE URINE RATIO
Creatinine, Urine: 8 mg/dL — ABNORMAL LOW (ref 20–275)
Microalb, Ur: <0.2 mg/dL

## 2019-04-16 LAB — URINALYSIS, ROUTINE W REFLEX MICROSCOPIC
Bilirubin Urine: NEGATIVE
Glucose, UA: NEGATIVE
Hgb urine dipstick: NEGATIVE
Ketones, ur: NEGATIVE
Leukocytes,Ua: NEGATIVE
Nitrite: NEGATIVE
Protein, ur: NEGATIVE
Specific Gravity, Urine: 1.003 (ref 1.001–1.03)
pH: 7 (ref 5.0–8.0)

## 2019-04-16 LAB — COMPLETE METABOLIC PANEL WITH GFR
AG Ratio: 2 (calc) (ref 1.0–2.5)
ALT: 24 U/L (ref 6–29)
AST: 22 U/L (ref 10–35)
Albumin: 4.3 g/dL (ref 3.6–5.1)
Alkaline phosphatase (APISO): 104 U/L (ref 37–153)
BUN: 16 mg/dL (ref 7–25)
CO2: 29 mmol/L (ref 20–32)
Calcium: 9.8 mg/dL (ref 8.6–10.4)
Chloride: 103 mmol/L (ref 98–110)
Creat: 0.76 mg/dL (ref 0.50–0.99)
GFR, Est African American: 94 mL/min/{1.73_m2} (ref >=60)
GFR, Est Non African American: 81 mL/min/{1.73_m2} (ref >=60)
Globulin: 2.1 g/dL (calc) (ref 1.9–3.7)
Glucose, Bld: 81 mg/dL (ref 65–99)
Potassium: 4.4 mmol/L (ref 3.5–5.3)
Sodium: 139 mmol/L (ref 135–146)
Total Bilirubin: 0.5 mg/dL (ref 0.2–1.2)
Total Protein: 6.4 g/dL (ref 6.1–8.1)

## 2019-04-16 LAB — LIPID PANEL
Cholesterol: 219 mg/dL — ABNORMAL HIGH (ref <200)
HDL: 103 mg/dL (ref >=50)
LDL Cholesterol (Calc): 99 mg/dL (calc)
Non-HDL Cholesterol (Calc): 116 mg/dL (calc) (ref <130)
Total CHOL/HDL Ratio: 2.1 (calc) (ref <5.0)
Triglycerides: 78 mg/dL (ref <150)

## 2019-04-16 LAB — HEMOGLOBIN A1C
Hgb A1c MFr Bld: 5.4 % of total Hgb (ref <5.7)
Mean Plasma Glucose: 108 (calc)
eAG (mmol/L): 6 (calc)

## 2019-04-16 LAB — VITAMIN D 25 HYDROXY (VIT D DEFICIENCY, FRACTURES): Vit D, 25-Hydroxy: 47 ng/mL (ref 30–100)

## 2019-04-16 LAB — MAGNESIUM: Magnesium: 2.2 mg/dL (ref 1.5–2.5)

## 2019-04-16 LAB — TSH: TSH: 1.98 mIU/L (ref 0.40–4.50)

## 2019-04-16 LAB — INSULIN, RANDOM: Insulin: 4.7 u[IU]/mL

## 2019-04-21 ENCOUNTER — Other Ambulatory Visit: Payer: Self-pay | Admitting: Internal Medicine

## 2019-04-21 DIAGNOSIS — Z1231 Encounter for screening mammogram for malignant neoplasm of breast: Secondary | ICD-10-CM

## 2019-04-23 ENCOUNTER — Ambulatory Visit (INDEPENDENT_AMBULATORY_CARE_PROVIDER_SITE_OTHER): Payer: Medicare Other | Admitting: Podiatry

## 2019-04-23 ENCOUNTER — Other Ambulatory Visit: Payer: Self-pay

## 2019-04-23 ENCOUNTER — Encounter: Payer: Self-pay | Admitting: Podiatry

## 2019-04-23 DIAGNOSIS — M722 Plantar fascial fibromatosis: Secondary | ICD-10-CM

## 2019-04-23 DIAGNOSIS — M778 Other enthesopathies, not elsewhere classified: Secondary | ICD-10-CM | POA: Diagnosis not present

## 2019-04-24 NOTE — Progress Notes (Signed)
Subjective:   Patient ID: Alicia Gates, female   DOB: 68 y.o.   MRN: [id number]   HPI Patient states still developing a lot of pain in her left foot and now it is into her ankle and into her forefoot.  States she feels like she has not been able to rest her foot and it simply is spreading and becoming more pervasive   ROS      Objective:  Physical Exam  Neurovascular status intact with patient's left foot still sore plantar with discomfort now into the Achilles tendon lateral ankle and into the forefoot     Assessment:  Inflammatory condition which may have some spread secondary to the function of the foot and compensation with secondary problems     Plan:  H&P reviewed condition and at this point before applied air fracture walker to completely immobilize and allow for complete resting of the foot and lower leg.  Patient will be seen back 4 weeks and will wear the splint full-time for approximate 3 and hopefully then be able to remove it for the last week

## 2019-04-25 ENCOUNTER — Other Ambulatory Visit: Payer: Self-pay

## 2019-04-25 ENCOUNTER — Ambulatory Visit
Admission: RE | Admit: 2019-04-25 | Discharge: 2019-04-25 | Disposition: A | Discharge status: Home / Self Care | Payer: Medicare Other | Source: Ambulatory Visit | Attending: Internal Medicine | Admitting: Internal Medicine

## 2019-04-25 ENCOUNTER — Other Ambulatory Visit: Payer: Self-pay | Admitting: Internal Medicine

## 2019-04-25 DIAGNOSIS — J069 Acute upper respiratory infection, unspecified: Secondary | ICD-10-CM

## 2019-04-25 DIAGNOSIS — Z1231 Encounter for screening mammogram for malignant neoplasm of breast: Secondary | ICD-10-CM

## 2019-05-21 ENCOUNTER — Other Ambulatory Visit: Payer: Self-pay

## 2019-05-21 ENCOUNTER — Encounter: Payer: Self-pay | Admitting: Podiatry

## 2019-05-21 ENCOUNTER — Ambulatory Visit (INDEPENDENT_AMBULATORY_CARE_PROVIDER_SITE_OTHER): Payer: Medicare Other | Admitting: Podiatry

## 2019-05-21 DIAGNOSIS — M778 Other enthesopathies, not elsewhere classified: Secondary | ICD-10-CM | POA: Diagnosis not present

## 2019-05-21 DIAGNOSIS — M7661 Achilles tendinitis, right leg: Secondary | ICD-10-CM

## 2019-05-21 DIAGNOSIS — M722 Plantar fascial fibromatosis: Secondary | ICD-10-CM

## 2019-06-02 NOTE — Progress Notes (Signed)
Patient scheduled for PT on 06/04/2019 at 10:00am.

## 2019-06-02 NOTE — Progress Notes (Signed)
MEDICARE ANNUAL WELLNESS VISIT  Assessment:     Encounter for Medicare annual wellness exam  Essential hypertension - continue medications, DASH diet, exercise and monitor at home. Call if greater than 130/80.   Raynaud's disease without gangrene Continue norvasc, stay warm  Osteopenia, unspecified location Osteopenia- get dexa with next mammogram in 2021, continue Vit D and Ca, weight bearing exercises  DDD (degenerative disc disease), lumbar monitor  Hyperlipidemia, unspecified hyperlipidemia type At goal with current medications; LDL goal <100; monitor  -check lipids q3-70m, decrease fatty foods, increase activity.   Depression, major, in remission ([place]) - remains in remission off of medications; continue to monitor closely - stress management techniques discussed, increase water, good sleep hygiene discussed, increase exercise, and increase veggies.   Allergic state, subsequent encounter - Allegra OTC, increase H20, allergy hygiene explained.  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; continue derm follow up as recommended  Over 40 minutes of exam, counseling, chart review and critical decision making was performed Future Appointments  Date Time Provider [place]  09/02/2019  8:45 AM Liane Comber, NP [facility] None  12/03/2019  9:30 AM Unk Pinto, MD [facility] None  04/26/2020 11:00 AM Unk Pinto, MD [facility] None   Plan:   During the course of the visit the patient was educated and counseled about appropriate screening and preventive services including:    Pneumococcal vaccine   Prevnar 13  Influenza vaccine  Td vaccine  Screening electrocardiogram  Bone densitometry  screening  Colorectal cancer screening  Diabetes screening  Glaucoma screening  Nutrition counseling   Advanced directives: requested   Subjective:  Alicia Gates is a 68 y.o. female who presents for Medicare Annual Wellness Visit and follow up.   She retired in June 2020, Copywriter, advertising, deferring travel plans.  She is on estrogen, s/p hysterectomy, on bASA.   Follows with derm/ Dr. Ledell Peoples office annually; recently had squamous cell removed from R chest and doing well.   She had left plantar fascitis/mid foot tendonitis followed by Dr. Paulla Dolly at [facility], will be starting PT tomorrow.   BMI is Body mass index is 24.19 kg/m., she has been working on diet and exercise, runs 40-50 min three days a week.  Wt Readings from Last 3 Encounters:  06/03/19 128 lb (58.1 kg)  04/15/19 124 lb (56.2 kg)  10/03/18 124 lb 12.8 oz (56.6 kg)   Her blood pressure has been controlled at home, today their BP is BP: 110/76  She does workout. She denies chest pain, shortness of breath, dizziness.   She is on cholesterol medication (on atorvastatin 40 mg twice daily in the evening due to myalgias with every other day dosing). Her LDL cholesterol is at goal. The cholesterol last visit was:   Lab Results  Component Value Date   CHOL 219 (H) 04/15/2019   HDL 103 04/15/2019   LDLCALC 99 04/15/2019   TRIG 78 04/15/2019   CHOLHDL 2.1 04/15/2019    She has been working on diet and exercise for glucose management, and denies paresthesia of the feet, polydipsia, polyuria and visual disturbances. Last A1C in the office was:  Lab Results  Component Value Date   HGBA1C 5.4 04/15/2019   Patient is on Vitamin D supplement.   Lab Results  Component Value Date  VD25OH 47 04/15/2019     Lab Results  Component Value Date   GFRNONAA 81 04/15/2019      Medication Review: Current Outpatient Medications on File Prior to Visit  Medication Sig Dispense Refill  . amLODipine (NORVASC)  2.5 MG tablet TAKE 1 TABLET DAILY 90 tablet 3  . atorvastatin (LIPITOR) 40 MG tablet Take 40 mg by mouth in the evening twice a week for cholesterol. 90 tablet 1  . azelastine (ASTELIN) 0.1 % nasal spray Use 1 to 2 sprays each nostril 1 to 2 x /day as Needed 90 mL 3  . BABY ASPIRIN PO Take 81 mg by mouth daily.    Marland Kitchen BIOTIN PO Take 10,000 mg by mouth daily.    . Calcium Carbonate-Vitamin D (CALCIUM + D PO) Take 600 mg by mouth daily.     . Cetirizine HCl (ZYRTEC ALLERGY PO) Take by mouth.    . Cholecalciferol (VITAMIN D PO) Take 4,000 Int'l Units by mouth 2 (two) times daily.     Marland Kitchen CINNAMON PO Take 1,000 mg by mouth daily.    Marland Kitchen estradiol (ESTRACE) 1 MG tablet TAKE 1 TABLET DAILY 90 tablet 3  . Multiple Vitamins-Minerals (MULTIVITAMIN PO) Take by mouth. With calcium    . tretinoin (RETIN-A) 0.1 % cream APPLICATION THIN LAYER TO FACE AT BEDTIME WASH OFF EACH AM  5  . triamcinolone cream (KENALOG) 0.1 % APPLY TO AFFECTED AREA TWICE A DAY AS NEEDED  2  . vitamin C (ASCORBIC ACID) 500 MG tablet Take 500 mg by mouth daily.    . Zinc 50 MG CAPS Take by mouth daily.     No current facility-administered medications on file prior to visit.    Allergies  Allergen Reactions  . Codeine     Current Problems (verified) Patient Active Problem List   Diagnosis Date Noted  . Medication management 01/04/2017  . Abnormal glucose 11/22/2015  . Stress incontinence 11/13/2014  . Hyperlipidemia, mixed   . Essential hypertension   . Depression, major, in remission ([place])   . Allergy   . Osteopenia   . Raynaud disease   . DDD (degenerative disc disease), lumbar     Screening Tests Immunization History  Administered Date(s) Administered  . Influenza Inj Mdck Quad Pf 03/04/2019  . Influenza-Unspecified 03/14/2018  . Pneumococcal Conjugate-13 12/07/2016  . Pneumococcal Polysaccharide-23 03/04/2019  . Pneumococcal-Unspecified 02/13/2003  . Tdap 09/29/2011  . Varicella Zoster Immune Globulin  11/22/2015  . Zoster Recombinat (Shingrix) 03/14/2018, 05/20/2018   Tetanus: 2013 Pneumovax: 2004, 2020 Prevnar 13: 2018 Flu vaccine: 02/2019 Zostavax: 2017  Pap: 11/2015 normal, does not need another MGM: 04/2019 hx of U/S left breast benign DEXA: May 2016 fem T -1.6 Osteopenia will wait 3 years, on estrace - PT PREF ORDER AT [facility] TO GET W MMG  Colonoscopy: 07/2014, 2 polyps removed, Dr. Sundra Aland, due 08/2019 EGD: N/A  CXR 08/2008  Names of Other Physician/Practitioners you currently use: 1. [facility] here for primary care 2. [facility], eye doctor, last visit 2019, looking for new provider, monitoring mild cataracts 3. Dr. Noah Charon, dentist [phone] 4. Lyndle Herrlich, last visit 2020, had squamous cell from chest removed 02/2019  Patient Care Team: Unk Pinto, MD as PCP - General (Internal Medicine) Suella Broad, MD as Consulting Physician (Physical Medicine and Rehabilitation) Rockey Situ, Kathlene November, MD as Consulting Physician (Cardiology) Ronald Lobo, MD as Consulting Physician (Gastroenterology) Druscilla Brownie, MD as Consulting Physician (Dermatology)  SURGICAL HISTORY She  has a past surgical history that includes Abdominal hysterectomy and Rhinoplasty. FAMILY HISTORY Her family history includes Arthritis in her father and mother; Cancer in her father; Diabetes in her mother; Hypertension in her father and mother; Macular degeneration in her father; Stroke in her father and mother. SOCIAL HISTORY She  reports that she has never smoked. She has never used smokeless tobacco. She reports current alcohol use. She reports that she does not use drugs.   MEDICARE WELLNESS OBJECTIVES: Physical activity: Current Exercise Habits: Home exercise routine, Type of exercise: walking;Other - see comments(running), Time (Minutes): 45, Frequency (Times/Week): 4, Weekly Exercise (Minutes/Week): 180, Intensity: Mild, Exercise limited  by: orthopedic condition(s) Cardiac risk factors: Cardiac Risk Factors include: advanced age (>47men, >37 women);dyslipidemia;hypertension Depression/mood screen:   Depression screen [facility] 2/9 06/03/2019  Decreased Interest 0  Down, Depressed, Hopeless 0  PHQ - 2 Score 0    ADLs:  In your present state of health, do you have any difficulty performing the following activities: 06/03/2019 04/14/2019  Hearing? N N  Vision? N N  Difficulty concentrating or making decisions? N N  Walking or climbing stairs? N N  Dressing or bathing? N N  Doing errands, shopping? N N  Some recent data might be hidden     Cognitive Testing  Alert? Yes  Normal Appearance?Yes  Oriented to person? Yes  Place? Yes   Time? Yes  Recall of three objects?  Yes  Can perform simple calculations? Yes  Displays appropriate judgment?Yes  Can read the correct time from a watch face?Yes  EOL planning: Does Patient Have a Medical Advance Directive?: Yes Type of Advance Directive: Healthcare Power of Attorney, Living will Does patient want to make changes to medical advance directive?: No - Patient declined Copy of [place] in Chart?: No - copy requested  Review of Systems  Constitutional: Negative.  Negative for malaise/fatigue and weight loss.  HENT: Negative.  Negative for hearing loss and tinnitus.   Eyes: Negative.  Negative for blurred vision and double vision.  Respiratory: Negative.  Negative for cough, sputum production, shortness of breath and wheezing.   Cardiovascular: Negative.  Negative for chest pain, palpitations, orthopnea, claudication, leg swelling and PND.  Gastrointestinal: Negative.  Negative for abdominal pain, blood in stool, constipation, diarrhea, heartburn, melena, nausea and vomiting.  Genitourinary: Negative.   Musculoskeletal: Positive for joint pain (left foot). Negative for falls and myalgias.  Skin: Negative.  Negative for rash.  Neurological: Negative for  dizziness, tingling, sensory change, weakness and headaches.  Endo/Heme/Allergies: Negative for polydipsia.  Psychiatric/Behavioral: Negative.  Negative for depression, memory loss, substance abuse and suicidal ideas. The patient is not nervous/anxious and does not have insomnia.   All other systems reviewed and are negative.    Objective:     Today's Vitals   06/03/19 1114  BP: 110/76  Pulse: (!) 59  Temp: (!) 97.2 F (36.2 C)  SpO2: 97%  Weight: 128 lb (58.1 kg)  Height: 5\' 1"  (1.549 m)   Body mass index is 24.19 kg/m.  General appearance: alert, no distress, WD/WN, female HEENT: normocephalic, sclerae anicteric, TMs pearly, nares patent, no discharge or erythema, Oral exam deferred; mask in place Oral cavity: Deferred Neck: supple, no lymphadenopathy, no thyromegaly, no masses Heart: RRR, normal S1, S2, no murmurs Lungs: CTA bilaterally, no wheezes, rhonchi, or rales Abdomen: +bs, soft, non tender, non distended, no masses, no hepatomegaly, no splenomegaly Musculoskeletal: nontender, no swelling, no obvious deformity Extremities: no edema, no cyanosis, no  clubbing Pulses: 2+ symmetric, upper and lower extremities, normal cap refill Neurological: alert, oriented x 3, CN2-12 intact, strength normal upper extremities and lower extremities, sensation normal throughout, DTRs 2+ throughout, no cerebellar signs, gait normal Psychiatric: normal affect, behavior normal, pleasant   Medicare Attestation I have personally reviewed: The patient's medical and social history Their use of alcohol, tobacco or illicit drugs Their current medications and supplements The patient's functional ability including ADLs,fall risks, home safety risks, cognitive, and hearing and visual impairment Diet and physical activities Evidence for depression or mood disorders  The patient's weight, height, BMI, and visual acuity have been recorded in the chart.  I have made referrals, counseling, and  provided education to the patient based on review of the above and I have provided the patient with a written personalized care plan for preventive services.     Izora Ribas, NP   06/03/2019

## 2019-06-03 ENCOUNTER — Other Ambulatory Visit: Payer: Self-pay

## 2019-06-03 ENCOUNTER — Ambulatory Visit (INDEPENDENT_AMBULATORY_CARE_PROVIDER_SITE_OTHER): Payer: Medicare Other | Admitting: Adult Health

## 2019-06-03 ENCOUNTER — Encounter: Payer: Self-pay | Admitting: Adult Health

## 2019-06-03 VITALS — BP 110/76 | HR 59 | Temp 97.2°F | Ht 61.0 in | Wt 128.0 lb

## 2019-06-03 DIAGNOSIS — I10 Essential (primary) hypertension: Secondary | ICD-10-CM | POA: Diagnosis not present

## 2019-06-03 DIAGNOSIS — I7300 Raynaud's syndrome without gangrene: Secondary | ICD-10-CM

## 2019-06-03 DIAGNOSIS — E782 Mixed hyperlipidemia: Secondary | ICD-10-CM

## 2019-06-03 DIAGNOSIS — E2839 Other primary ovarian failure: Secondary | ICD-10-CM

## 2019-06-03 DIAGNOSIS — N393 Stress incontinence (female) (male): Secondary | ICD-10-CM | POA: Diagnosis not present

## 2019-06-03 DIAGNOSIS — F325 Major depressive disorder, single episode, in full remission: Secondary | ICD-10-CM

## 2019-06-03 DIAGNOSIS — R7309 Other abnormal glucose: Secondary | ICD-10-CM

## 2019-06-03 DIAGNOSIS — Z6823 Body mass index (BMI) 23.0-23.9, adult: Secondary | ICD-10-CM

## 2019-06-03 DIAGNOSIS — M8580 Other specified disorders of bone density and structure, unspecified site: Secondary | ICD-10-CM | POA: Diagnosis not present

## 2019-06-03 DIAGNOSIS — T7840XD Allergy, unspecified, subsequent encounter: Secondary | ICD-10-CM

## 2019-06-03 DIAGNOSIS — Z0000 Encounter for general adult medical examination without abnormal findings: Secondary | ICD-10-CM

## 2019-06-03 DIAGNOSIS — R6889 Other general symptoms and signs: Secondary | ICD-10-CM | POA: Diagnosis not present

## 2019-06-03 DIAGNOSIS — Z79899 Other long term (current) drug therapy: Secondary | ICD-10-CM

## 2019-06-03 DIAGNOSIS — M5136 Other intervertebral disc degeneration, lumbar region: Secondary | ICD-10-CM

## 2019-06-03 DIAGNOSIS — Z85828 Personal history of other malignant neoplasm of skin: Secondary | ICD-10-CM

## 2019-06-03 DIAGNOSIS — Z0001 Encounter for general adult medical examination with abnormal findings: Secondary | ICD-10-CM | POA: Diagnosis not present

## 2019-06-03 HISTORY — DX: Personal history of other malignant neoplasm of skin: Z85.828

## 2019-06-04 NOTE — Progress Notes (Signed)
Subjective:   Patient ID: Alicia Gates, female   DOB: 68 y.o.   MRN: [id number]   HPI Patient states she is improving but still having some discomfort of her left foot with activity   ROS      Objective:  Physical Exam  Neurovascular status intact with patient's left foot doing okay with minimal discomfort upon deep palpation and pain only when very active     Assessment:  Extensor tendinitis fasciitis which seems to be improving     Plan:  H&P reviewed condition recommended stretching exercises anti-inflammatories continued physical therapy.  Patient is discharged but will be seen back as needed if symptoms were to get worse or persist and I educated her on this fact in both conditions

## 2019-06-26 DIAGNOSIS — Z85828 Personal history of other malignant neoplasm of skin: Secondary | ICD-10-CM | POA: Diagnosis not present

## 2019-06-26 DIAGNOSIS — L905 Scar conditions and fibrosis of skin: Secondary | ICD-10-CM | POA: Diagnosis not present

## 2019-06-26 DIAGNOSIS — L910 Hypertrophic scar: Secondary | ICD-10-CM | POA: Diagnosis not present

## 2019-07-03 ENCOUNTER — Ambulatory Visit: Payer: Medicare Other

## 2019-07-11 ENCOUNTER — Ambulatory Visit: Payer: Medicare Other

## 2019-07-14 ENCOUNTER — Ambulatory Visit: Payer: Medicare Other

## 2019-07-28 DIAGNOSIS — L910 Hypertrophic scar: Secondary | ICD-10-CM | POA: Diagnosis not present

## 2019-08-05 ENCOUNTER — Other Ambulatory Visit: Payer: Self-pay | Admitting: Internal Medicine

## 2019-08-25 DIAGNOSIS — L910 Hypertrophic scar: Secondary | ICD-10-CM | POA: Diagnosis not present

## 2019-09-01 NOTE — Progress Notes (Deleted)
FOLLOW UP  Assessment and Plan:   Essential hypertension - continue medications, DASH diet, exercise and monitor at home. Call if greater than 130/80.   Raynaud's disease without gangrene Continue norvasc, stay warm  Hyperlipidemia, unspecified hyperlipidemia type At goal with current medications; LDL goal <100; monitor  -check lipids q3-90m, decrease fatty foods, increase activity.   Depression, major, in remission ([place]) - remains in remission off of medications; continue to monitor closely - stress management techniques discussed, increase water, good sleep hygiene discussed, increase exercise, and increase veggies.   Other abnormal glucose Recent A1Cs at goal Discussed diet/exercise, weight management  Defer A1C; monitor serum glucose and weight trends  Vitamin D deficiency Continue supplement  Continue diet and meds as discussed. Further disposition pending results of labs. Discussed med's effects and SE's.   Over 30 minutes of exam, counseling, chart review, and critical decision making was performed.   Future Appointments  Date Time Provider [place]  09/04/2019  8:45 AM Liane Comber, NP [facility] None  12/03/2019  9:30 AM Unk Pinto, MD [facility] None  04/26/2020 11:00 AM Unk Pinto, MD [facility] None  06/02/2020  9:00 AM Liane Comber, NP [facility] None    ----------------------------------------------------------------------------------------------------------------------  HPI 69 y.o. female  presents for 3 month follow up on hypertension, cholesterol, glucose, weight, depression and vitamin D deficiency.   She retired in June 2020, Copywriter, advertising, deferring travel plans.  She is on estradiol 1 mg daily ***, s/p hysterectomy, on bASA.   Follows with derm/ Dr. Ledell Peoples office annually; recently had squamous cell removed from R chest and doing well.   She had left plantar fascitis/mid foot tendonitis followed by Dr. Paulla Dolly at [facility], did PT ***  BMI is There is no height or weight on file to calculate BMI., she has been working on diet and exercise, runs 40-50 min three days a week.  Wt Readings from Last 3 Encounters:  06/03/19 128 lb (58.1 kg)  04/15/19 124 lb (56.2 kg)  10/03/18 124 lb 12.8 oz (56.6 kg)    Her blood pressure {HAS HAS NOT:18834} been controlled at home, today their BP is   She is on amlodipine for Raynaud's  She {DOES_DOES [id number] workout. She denies chest pain, shortness of breath, dizziness.   She is on cholesterol medication Atorvastatin (40 mg twice daily in the evening due to myalgias with every other day dosing) and currently denies myalgias. Her LDL cholesterol is at goal. The cholesterol last visit was:   Lab Results  Component Value Date   CHOL 219 (H) 04/15/2019   HDL 103 04/15/2019   LDLCALC 99 04/15/2019   TRIG 78 04/15/2019   CHOLHDL 2.1 04/15/2019    She {Has/has not:18111} been working on diet and exercise for glucose management. Last A1C in the office was:  Lab Results  Component Value Date   HGBA1C 5.4 04/15/2019    She has stable CKD II monitored at this office:  Lab Results  Component Value Date   GFRNONAA 81 04/15/2019   Patient is on Vitamin D supplement.   Lab Results  Component Value Date   VD25OH 47 04/15/2019        Current Medications:  Current Outpatient Medications on File Prior to Visit  Medication Sig  . amLODipine (NORVASC) 2.5 MG tablet Take 1 tablet Daily for BP  . atorvastatin (LIPITOR) 40 MG tablet Take 40 mg by mouth in the evening twice a week for cholesterol.  Marland Kitchen azelastine (ASTELIN) 0.1 %  nasal spray Use 1 to 2 sprays each nostril 1 to 2 x /day as Needed  . BABY ASPIRIN PO Take 81 mg by mouth daily.  Marland Kitchen BIOTIN PO Take 10,000 mg by mouth daily.  . Calcium Carbonate-Vitamin D (CALCIUM + D PO) Take 600 mg by mouth daily.   . Cetirizine HCl (ZYRTEC ALLERGY PO) Take by mouth.  . Cholecalciferol (VITAMIN D PO) Take 4,000 Int'l  Units by mouth 2 (two) times daily.   Marland Kitchen CINNAMON PO Take 1,000 mg by mouth daily.  Marland Kitchen estradiol (ESTRACE) 1 MG tablet TAKE 1 TABLET DAILY  . Multiple Vitamins-Minerals (MULTIVITAMIN PO) Take by mouth. With calcium  . tretinoin (RETIN-A) 0.1 % cream APPLICATION THIN LAYER TO FACE AT BEDTIME WASH OFF EACH AM  . triamcinolone cream (KENALOG) 0.1 % APPLY TO AFFECTED AREA TWICE A DAY AS NEEDED  . vitamin C (ASCORBIC ACID) 500 MG tablet Take 500 mg by mouth daily.  . Zinc 50 MG CAPS Take by mouth daily.   No current facility-administered medications on file prior to visit.     Allergies:  Allergies  Allergen Reactions  . Codeine      Medical History:  Past Medical History:  Diagnosis Date  . Allergy   . DDD (degenerative disc disease), lumbar   . Depression   . Hyperlipidemia   . Hypertension   . Osteopenia   . Personal history of squamous cell carcinoma of skin 06/03/2019  . Raynaud disease 2010   Family history- Reviewed and unchanged Social history- Reviewed and unchanged   Review of Systems:  ROS    Physical Exam: There were no vitals taken for this visit. Wt Readings from Last 3 Encounters:  06/03/19 128 lb (58.1 kg)  04/15/19 124 lb (56.2 kg)  10/03/18 124 lb 12.8 oz (56.6 kg)   General Appearance: Well nourished, in no apparent distress. Eyes: PERRLA, EOMs, conjunctiva no swelling or erythema Sinuses: No Frontal/maxillary tenderness ENT/Mouth: Ext aud canals clear, TMs without erythema, bulging. No erythema, swelling, or exudate on post pharynx.  Tonsils not swollen or erythematous. Hearing normal.  Neck: Supple, thyroid normal.  Respiratory: Respiratory effort normal, BS equal bilaterally without rales, rhonchi, wheezing or stridor.  Cardio: RRR with no MRGs. Brisk peripheral pulses without edema.  Abdomen: Soft, + BS.  Non tender, no guarding, rebound, hernias, masses. Lymphatics: Non tender without lymphadenopathy.  Musculoskeletal: Full ROM, 5/5 strength,  Normal gait Skin: Warm, dry without rashes, lesions, ecchymosis.  Neuro: Cranial nerves intact. No cerebellar symptoms.  Psych: Awake and oriented X 3, normal affect, Insight and Judgment appropriate.    Izora Ribas, NP 1:53 PM [facility]

## 2019-09-02 ENCOUNTER — Ambulatory Visit: Payer: Medicare Other | Admitting: Adult Health

## 2019-09-03 ENCOUNTER — Other Ambulatory Visit: Payer: Self-pay | Admitting: Internal Medicine

## 2019-09-03 DIAGNOSIS — E782 Mixed hyperlipidemia: Secondary | ICD-10-CM

## 2019-09-04 ENCOUNTER — Ambulatory Visit: Payer: Medicare Other | Admitting: Adult Health

## 2019-09-16 IMAGING — MG DIGITAL SCREENING BILATERAL MAMMOGRAM WITH TOMO AND CAD
8 series · 9 of 24 positions shown · non-contrast
Comparison: Previous exam(s).

CLINICAL DATA: Screening.

EXAM:
DIGITAL SCREENING BILATERAL MAMMOGRAM WITH TOMO AND CAD

[R MLO synth-2D]
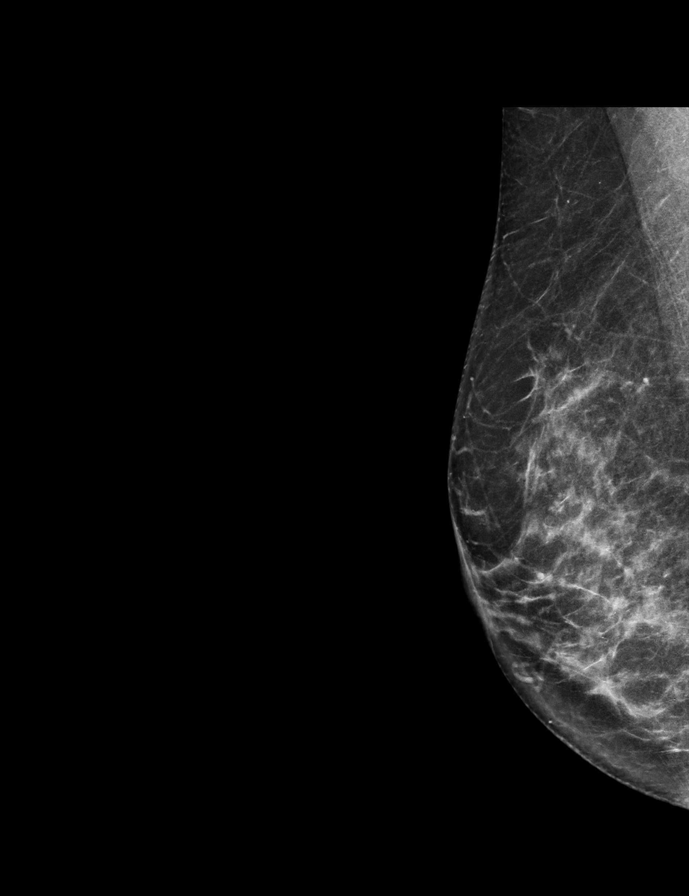

[R CC synth-2D]
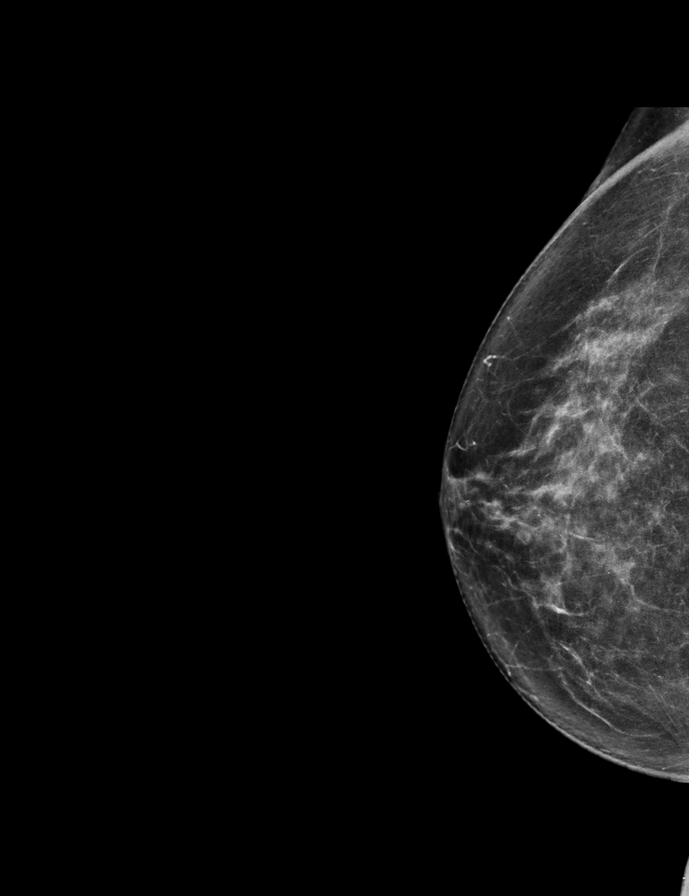

[L MLO synth-2D]
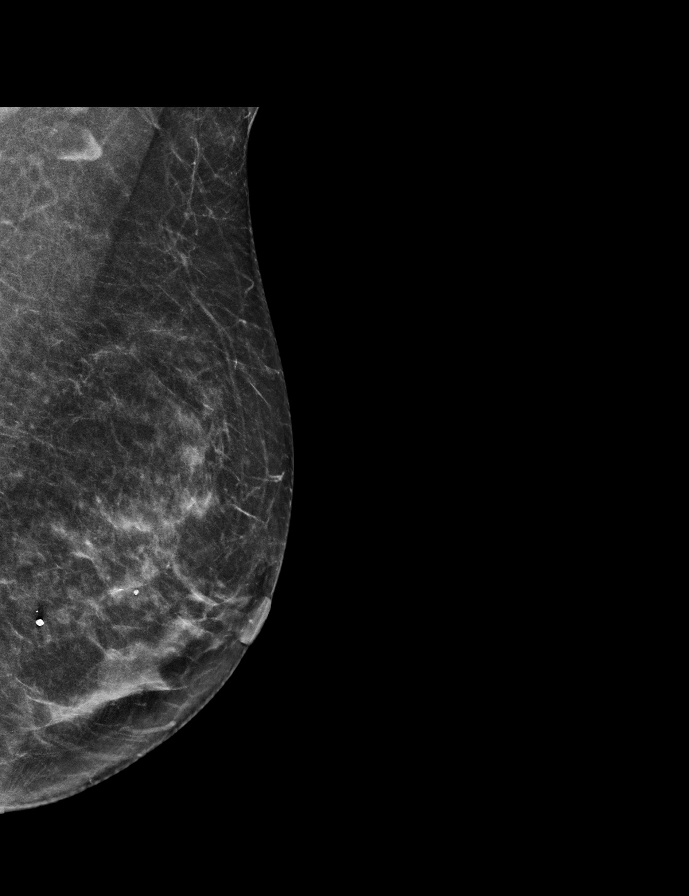

[L CC synth-2D]
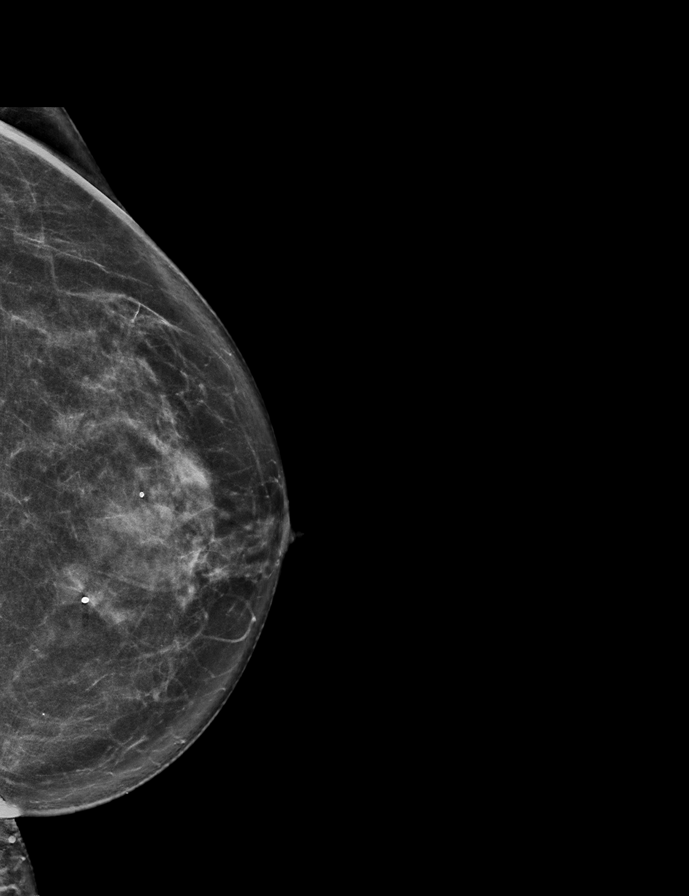

[R MLO tomo · 2 of 59 frames shown]
[frame 20/59]
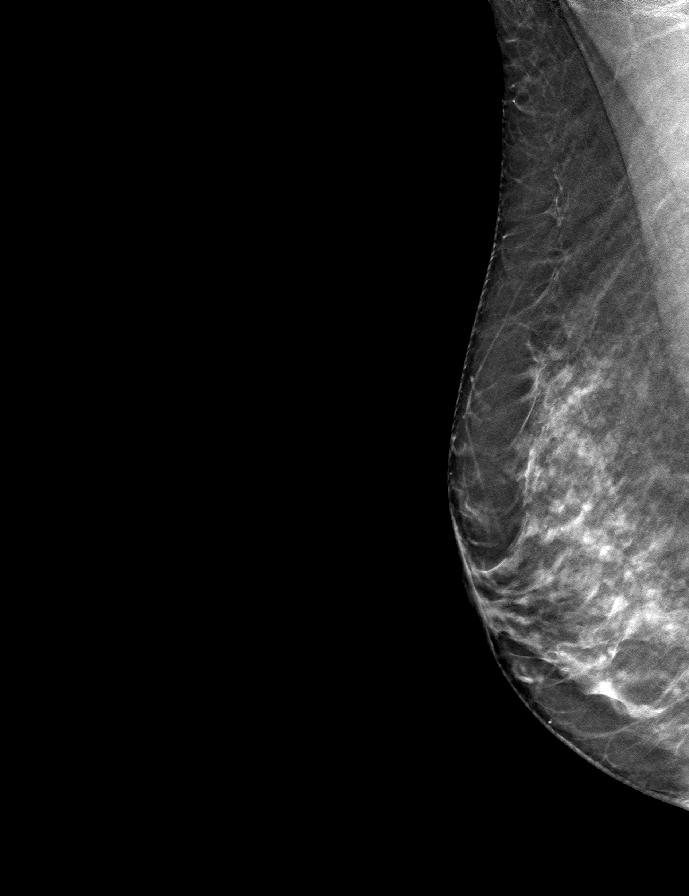
[frame 30/59]
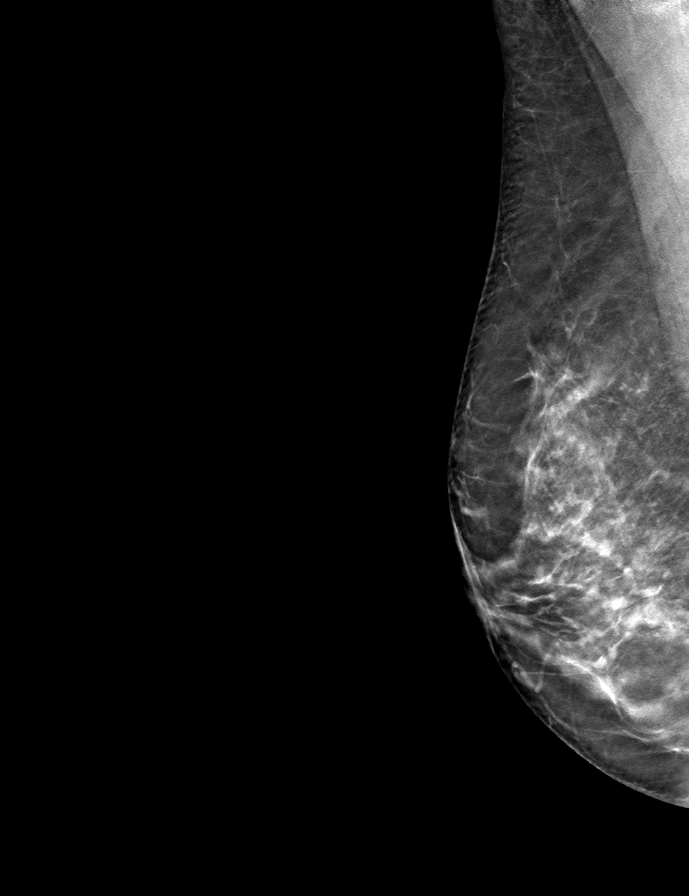

[L MLO tomo · tomo slice 31/60.0]
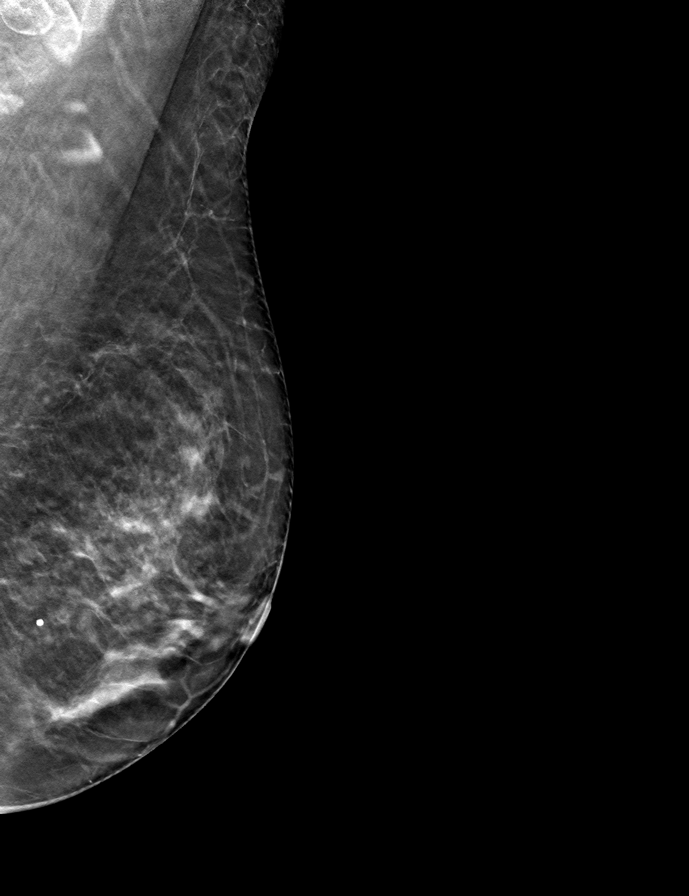

[L CC tomo · tomo slice 33/66.0]
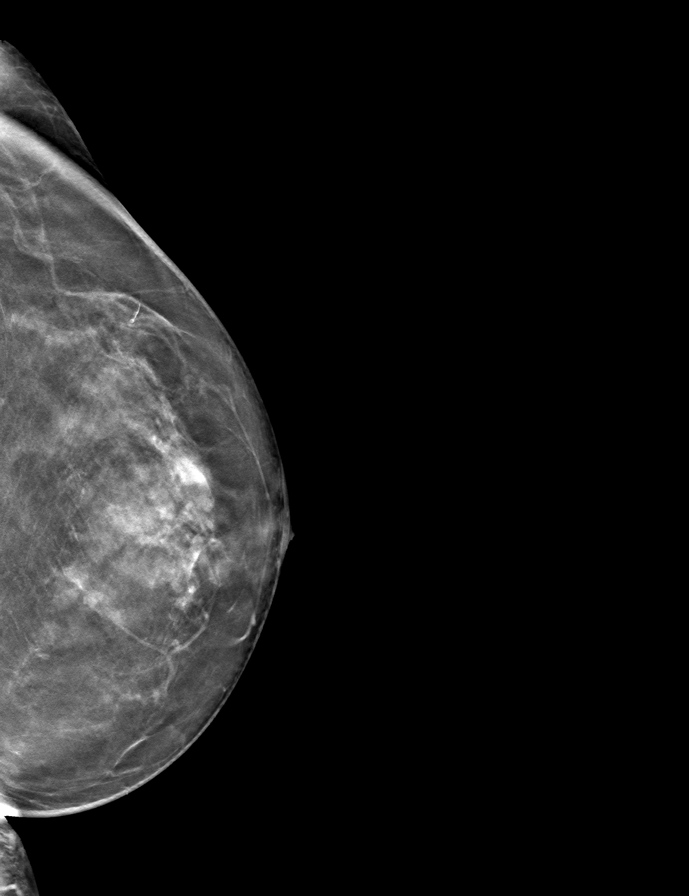

[R CC tomo · tomo slice 33/65.0]
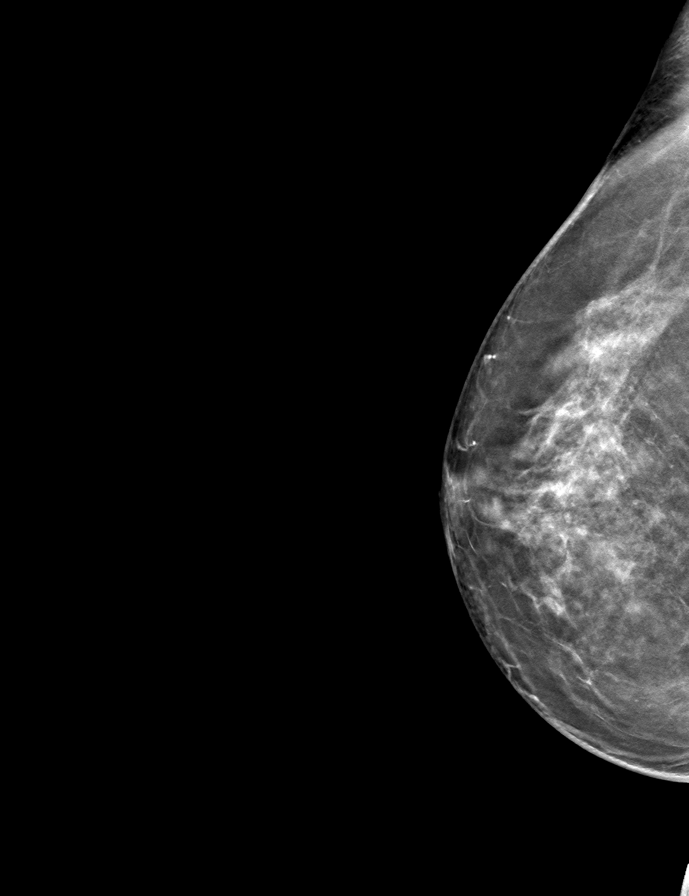

[9 of 24 positions shown; findings below may reference images not displayed]

ACR Breast Density Category c: The breast tissue is heterogeneously
dense, which may obscure small masses.
FINDINGS: There are no findings suspicious for malignancy. Images were
processed with CAD.
IMPRESSION: No mammographic evidence of malignancy. A result letter of this
screening mammogram will be mailed directly to the patient.

RECOMMENDATION:
Screening mammogram in one year. (Code:[id number]-U-LHB)

BI-RADS CATEGORY  1: Negative.

## 2019-09-18 DIAGNOSIS — Z1159 Encounter for screening for other viral diseases: Secondary | ICD-10-CM | POA: Diagnosis not present

## 2019-09-22 DIAGNOSIS — D122 Benign neoplasm of ascending colon: Secondary | ICD-10-CM | POA: Diagnosis not present

## 2019-09-22 DIAGNOSIS — Z800 Family history of malignant neoplasm of digestive organs: Secondary | ICD-10-CM | POA: Diagnosis not present

## 2019-09-22 DIAGNOSIS — Z86010 Personal history of colonic polyps: Secondary | ICD-10-CM | POA: Diagnosis not present

## 2019-09-23 DIAGNOSIS — D122 Benign neoplasm of ascending colon: Secondary | ICD-10-CM | POA: Diagnosis not present

## 2019-09-23 DIAGNOSIS — Z86010 Personal history of colonic polyps: Secondary | ICD-10-CM | POA: Diagnosis not present

## 2019-09-23 DIAGNOSIS — Z800 Family history of malignant neoplasm of digestive organs: Secondary | ICD-10-CM | POA: Diagnosis not present

## 2019-09-23 LAB — HM COLONOSCOPY

## 2019-09-26 DIAGNOSIS — D122 Benign neoplasm of ascending colon: Secondary | ICD-10-CM | POA: Diagnosis not present

## 2019-09-30 DIAGNOSIS — L910 Hypertrophic scar: Secondary | ICD-10-CM | POA: Diagnosis not present

## 2019-09-30 DIAGNOSIS — L309 Dermatitis, unspecified: Secondary | ICD-10-CM | POA: Diagnosis not present

## 2019-10-14 ENCOUNTER — Encounter: Payer: Self-pay | Admitting: *Deleted

## 2019-10-21 DIAGNOSIS — L910 Hypertrophic scar: Secondary | ICD-10-CM | POA: Diagnosis not present

## 2019-10-21 DIAGNOSIS — L989 Disorder of the skin and subcutaneous tissue, unspecified: Secondary | ICD-10-CM | POA: Diagnosis not present

## 2019-10-21 DIAGNOSIS — D485 Neoplasm of uncertain behavior of skin: Secondary | ICD-10-CM | POA: Diagnosis not present

## 2019-12-03 ENCOUNTER — Encounter: Payer: Self-pay | Admitting: Internal Medicine

## 2019-12-03 ENCOUNTER — Other Ambulatory Visit: Payer: Self-pay

## 2019-12-03 ENCOUNTER — Ambulatory Visit (INDEPENDENT_AMBULATORY_CARE_PROVIDER_SITE_OTHER): Payer: Medicare Other | Admitting: Internal Medicine

## 2019-12-03 VITALS — BP 118/84 | HR 72 | Temp 97.1°F | Resp 16 | Ht 61.0 in | Wt 124.8 lb

## 2019-12-03 DIAGNOSIS — F419 Anxiety disorder, unspecified: Secondary | ICD-10-CM

## 2019-12-03 DIAGNOSIS — E782 Mixed hyperlipidemia: Secondary | ICD-10-CM

## 2019-12-03 DIAGNOSIS — I10 Essential (primary) hypertension: Secondary | ICD-10-CM | POA: Diagnosis not present

## 2019-12-03 DIAGNOSIS — Z79899 Other long term (current) drug therapy: Secondary | ICD-10-CM | POA: Diagnosis not present

## 2019-12-03 DIAGNOSIS — E559 Vitamin D deficiency, unspecified: Secondary | ICD-10-CM | POA: Diagnosis not present

## 2019-12-03 DIAGNOSIS — R7309 Other abnormal glucose: Secondary | ICD-10-CM

## 2019-12-03 DIAGNOSIS — M8580 Other specified disorders of bone density and structure, unspecified site: Secondary | ICD-10-CM

## 2019-12-03 MED ORDER — ESTRADIOL 1 MG PO TABS: ORAL_TABLET | ORAL | 3 refills | Status: DC

## 2019-12-03 MED ORDER — ALPRAZOLAM 0.5 MG PO TABS: 0.5000 mg | ORAL_TABLET | Freq: Three times a day (TID) | ORAL | 0 refills | Status: DC | PRN

## 2019-12-03 NOTE — Patient Instructions (Signed)

## 2019-12-03 NOTE — Progress Notes (Addendum)
History of Present Illness:       This very nice 69 y.o.MWF  presents for 6  month follow up with HTN, HLD, Pre-Diabetes and Vitamin D Deficiency. Patient also relates being under a fair amount of stress due to her daughter's divorcing and patient requests  short-term meds for anxiety & sleep.      Patient has hx/o labile HTN predating since 2001 , altho Treatment was started in 2009.  BP has been controlled at home. Today's BP is at goal - 118/84. Patient has had no complaints of any cardiac type chest pain, palpitations, dyspnea / orthopnea / PND, dizziness, claudication, or dependent edema.      Hyperlipidemia is controlled with diet & Atorvastatin. Patient denies myalgias or other med SE's. Last Lipids were at goal with a very high HDL (99):  Lab Results  Component Value Date   CHOL 219 (H) 04/15/2019   HDL 103 04/15/2019   LDLCALC 99 04/15/2019   TRIG 78 04/15/2019   CHOLHDL 2.1 04/15/2019    Also, the patient has history of PreDiabetes (A1c 5.7%  / 2015 and 5.9% / 2016)  and has had no symptoms of reactive hypoglycemia, diabetic polys, paresthesias or visual blurring.  Last A1c was Normal & at goal:  Lab Results  Component Value Date   HGBA1C 5.4 04/15/2019       Further, the patient also has history of Vitamin D Deficiency ("27" / 2009)  and supplements vitamin D without any suspected side-effects. Last vitamin D was sl low:  Lab Results  Component Value Date   VD25OH 47 04/15/2019    Current Outpatient Medications on File Prior to Visit  Medication Sig  . amLODipine (NORVASC) 2.5 MG tablet Take 1 tablet Daily for BP  . atorvastatin (LIPITOR) 40 MG tablet Take 40 mg by mouth in the evening twice a week for cholesterol.  Marland Kitchen atorvastatin (LIPITOR) 80 MG tablet TAKE 1/2 TO 1 TABLET DAILY FOR CHOLESTEROL OR AS      DIRECTED  . azelastine (ASTELIN) 0.1 % nasal spray Use 1 to 2 sprays each nostril 1 to 2 x /day as Needed  . BABY ASPIRIN PO Take 81 mg by mouth daily.  Marland Kitchen  BIOTIN PO Take 10,000 mg by mouth daily.  . Calcium Carbonate-Vitamin D (CALCIUM + D PO) Take 600 mg by mouth daily.   . Cetirizine HCl (ZYRTEC ALLERGY PO) Take by mouth.  . Cholecalciferol (VITAMIN D PO) Take 4,000 Int'l Units by mouth 2 (two) times daily.   Marland Kitchen CINNAMON PO Take 1,000 mg by mouth daily.  Marland Kitchen estradiol (ESTRACE) 1 MG tablet TAKE 1 TABLET DAILY  . Multiple Vitamins-Minerals (MULTIVITAMIN PO) Take by mouth. With calcium  . tretinoin (RETIN-A) 0.1 % cream APPLICATION THIN LAYER TO FACE AT BEDTIME WASH OFF EACH AM  . triamcinolone cream (KENALOG) 0.1 % APPLY TO AFFECTED AREA TWICE A DAY AS NEEDED  . vitamin C (ASCORBIC ACID) 500 MG tablet Take 500 mg by mouth daily.  . Zinc 50 MG CAPS Take by mouth daily.   No current facility-administered medications on file prior to visit.    Allergies  Allergen Reactions  . Codeine     PMHx:   Past Medical History:  Diagnosis Date  . Allergy   . DDD (degenerative disc disease), lumbar   . Depression   . Hyperlipidemia   . Hypertension   . Osteopenia   . Personal history of squamous cell carcinoma of skin  06/03/2019  . Raynaud disease 2010    Immunization History  Administered Date(s) Administered  . Influenza Inj Mdck Quad Pf 03/04/2019  . Influenza-Unspecified 03/14/2018  . Pneumococcal Conjugate-13 12/07/2016  . Pneumococcal Polysaccharide-23 03/04/2019  . Pneumococcal-Unspecified 02/13/2003  . Tdap 09/29/2011  . Varicella Zoster Immune Globulin 11/22/2015  . Zoster Recombinat (Shingrix) 03/14/2018, 05/20/2018    Past Surgical History:  Procedure Laterality Date  . ABDOMINAL HYSTERECTOMY    . RHINOPLASTY      FHx:    Reviewed / unchanged  SHx:    Reviewed / unchanged   Systems Review:  Constitutional: Denies fever, chills, wt changes, headaches, insomnia, fatigue, night sweats, change in appetite. Eyes: Denies redness, blurred vision, diplopia, discharge, itchy, watery eyes.  ENT: Denies discharge, congestion,  post nasal drip, epistaxis, sore throat, earache, hearing loss, dental pain, tinnitus, vertigo, sinus pain, snoring.  CV: Denies chest pain, palpitations, irregular heartbeat, syncope, dyspnea, diaphoresis, orthopnea, PND, claudication or edema. Respiratory: denies cough, dyspnea, DOE, pleurisy, hoarseness, laryngitis, wheezing.  Gastrointestinal: Denies dysphagia, odynophagia, heartburn, reflux, water brash, abdominal pain or cramps, nausea, vomiting, bloating, diarrhea, constipation, hematemesis, melena, hematochezia  or hemorrhoids. Genitourinary: Denies dysuria, frequency, urgency, nocturia, hesitancy, discharge, hematuria or flank pain. Musculoskeletal: Denies arthralgias, myalgias, stiffness, jt. swelling, pain, limping or strain/sprain.  Skin: Denies pruritus, rash, hives, warts, acne, eczema or change in skin lesion(s). Neuro: No weakness, tremor, incoordination, spasms, paresthesia or pain. Psychiatric: Denies confusion, memory loss or sensory loss. Endo: Denies change in weight, skin or hair change.  Heme/Lymph: No excessive bleeding, bruising or enlarged lymph nodes.  Physical Exam  BP 118/84   Pulse 72   Temp (!) 97.1 F (36.2 C)   Resp 16   Ht 5\' 1"  (1.549 m)   Wt 124 lb 12.8 oz (56.6 kg)   BMI 23.58 kg/m   Appears  well nourished, well groomed  and in no distress.  Eyes: PERRLA, EOMs, conjunctiva no swelling or erythema. Sinuses: No frontal/maxillary tenderness ENT/Mouth: EAC's clear, TM's nl w/o erythema, bulging. Nares clear w/o erythema, swelling, exudates. Oropharynx clear without erythema or exudates. Oral hygiene is good. Tongue normal, non obstructing. Hearing intact.  Neck: Supple. Thyroid not palpable. Car 2+/2+ without bruits, nodes or JVD. Chest: Respirations nl with BS clear & equal w/o rales, rhonchi, wheezing or stridor.  Cor: Heart sounds normal w/ regular rate and rhythm without sig. murmurs, gallops, clicks or rubs. Peripheral pulses normal and equal   without edema.  Abdomen: Soft & bowel sounds normal. Non-tender w/o guarding, rebound, hernias, masses or organomegaly.  Lymphatics: Unremarkable.  Musculoskeletal: Full ROM all peripheral extremities, joint stability, 5/5 strength and normal gait.  Skin: Warm, dry without exposed rashes, lesions or ecchymosis apparent.  Neuro: Cranial nerves intact, reflexes equal bilaterally. Sensory-motor testing grossly intact. Tendon reflexes grossly intact.  Pysch: Alert & oriented x 3.  Insight and judgement nl & appropriate. No ideations.  Assessment and Plan:  1. Essential hypertension  - Continue medication, monitor blood pressure at home.  - Continue DASH diet.  Reminder to go to the ER if any CP,  SOB, nausea, dizziness, severe HA, changes vision/speech.  - CBC with Differential/Platelet - COMPLETE METABOLIC PANEL WITH GFR - Magnesium - TSH  2. Hyperlipidemia, mixed  - Continue diet/meds, exercise,& lifestyle modifications.  - Continue monitor periodic cholesterol/liver & renal functions   - Lipid panel - TSH  3. Abnormal glucose  - Continue diet, exercise  - Lifestyle modifications.  - Monitor appropriate labs.  -  Hemoglobin A1c - Insulin, random  4. Vitamin D deficiency  - Continue supplementation.  - VITAMIN D 25 Hydroxy  5. Anxiety  - ALPRAZolam (XANAX) 0.5 MG tablet; Take 1 tablet 3  times daily as needed for sleep or anxiety.  Dispense: 90 tablet; Refill: 0  6. Osteopenia  - estradiol (ESTRACE) 1 MG tablet; TAKE 1 TABLET DAILY  Dispense: 90 tablet; Refill: 3  7. Medication management  - CBC with Differential/Platelet - COMPLETE METABOLIC PANEL WITH GFR - Magnesium - Lipid panel - TSH - Hemoglobin A1c  - Insulin, random - VITAMIN D 25 Hydroxy        Discussed  regular exercise, BP monitoring, weight control to achieve/maintain BMI less than 25 and discussed med and SE's. Recommended labs to assess and monitor clinical status with further disposition  pending results of labs.  I discussed the assessment and treatment plan with the patient. The patient was provided an opportunity to ask questions and all were answered. The patient agreed with the plan and demonstrated an understanding of the instructions.  I provided over 30 minutes of exam, counseling, chart review and  complex critical decision making.  Kirtland Bouchard, MD

## 2019-12-04 LAB — HEMOGLOBIN A1C
Hgb A1c MFr Bld: 5.3 % of total Hgb (ref <5.7)
Mean Plasma Glucose: 105 (calc)
eAG (mmol/L): 5.8 (calc)

## 2019-12-04 LAB — CBC WITH DIFFERENTIAL/PLATELET
Absolute Monocytes: 523 cells/uL (ref 200–950)
Basophils Absolute: 40 cells/uL (ref 0–200)
Basophils Relative: 0.6 %
Eosinophils Absolute: 141 cells/uL (ref 15–500)
Eosinophils Relative: 2.1 %
HCT: 42.1 % (ref 35.0–45.0)
Hemoglobin: 13.8 g/dL (ref 11.7–15.5)
Lymphs Abs: 2157 cells/uL (ref 850–3900)
MCH: 31.5 pg (ref 27.0–33.0)
MCHC: 32.8 g/dL (ref 32.0–36.0)
MCV: 96.1 fL (ref 80.0–100.0)
MPV: 11.2 fL (ref 7.5–12.5)
Monocytes Relative: 7.8 %
Neutro Abs: 3839 cells/uL (ref 1500–7800)
Neutrophils Relative %: 57.3 %
Platelets: 269 10*3/uL (ref 140–400)
RBC: 4.38 10*6/uL (ref 3.80–5.10)
RDW: 12.4 % (ref 11.0–15.0)
Total Lymphocyte: 32.2 %
WBC: 6.7 10*3/uL (ref 3.8–10.8)

## 2019-12-04 LAB — COMPLETE METABOLIC PANEL WITH GFR
AG Ratio: 1.9 (calc) (ref 1.0–2.5)
ALT: 25 U/L (ref 6–29)
AST: 25 U/L (ref 10–35)
Albumin: 4.5 g/dL (ref 3.6–5.1)
Alkaline phosphatase (APISO): 118 U/L (ref 37–153)
BUN: 14 mg/dL (ref 7–25)
CO2: 30 mmol/L (ref 20–32)
Calcium: 9.9 mg/dL (ref 8.6–10.4)
Chloride: 106 mmol/L (ref 98–110)
Creat: 0.77 mg/dL (ref 0.50–0.99)
GFR, Est African American: 92 mL/min/{1.73_m2} (ref >=60)
GFR, Est Non African American: 79 mL/min/{1.73_m2} (ref >=60)
Globulin: 2.4 g/dL (calc) (ref 1.9–3.7)
Glucose, Bld: 95 mg/dL (ref 65–99)
Potassium: 4.5 mmol/L (ref 3.5–5.3)
Sodium: 142 mmol/L (ref 135–146)
Total Bilirubin: 0.6 mg/dL (ref 0.2–1.2)
Total Protein: 6.9 g/dL (ref 6.1–8.1)

## 2019-12-04 LAB — VITAMIN D 25 HYDROXY (VIT D DEFICIENCY, FRACTURES): Vit D, 25-Hydroxy: 56 ng/mL (ref 30–100)

## 2019-12-04 LAB — LIPID PANEL
Cholesterol: 210 mg/dL — ABNORMAL HIGH (ref <200)
HDL: 107 mg/dL (ref >=50)
LDL Cholesterol (Calc): 88 mg/dL (calc)
Non-HDL Cholesterol (Calc): 103 mg/dL (calc) (ref <130)
Total CHOL/HDL Ratio: 2 (calc) (ref <5.0)
Triglycerides: 65 mg/dL (ref <150)

## 2019-12-04 LAB — TSH: TSH: 1.63 mIU/L (ref 0.40–4.50)

## 2019-12-04 LAB — MAGNESIUM: Magnesium: 2.5 mg/dL (ref 1.5–2.5)

## 2019-12-04 LAB — INSULIN, RANDOM: Insulin: 5 u[IU]/mL

## 2019-12-04 NOTE — Progress Notes (Signed)
=========================================================  -    Sl elevation of Chol - is OK since the good HDL Chol = 107 is so high and   The bad LDL Chol = 88 - is so low   - Very low risk for Heart Attack  / Stroke ======================================================  -  A1c - Normal - Great - No Diabetes =========================================================  -  Vitamin D = 56 - OK  =========================================================  -  All Else - CBC - Kidneys - Electrolytes -  Liver - Magnesium & Thyroid  - all  Normal / OK =========================================================

## 2020-02-02 ENCOUNTER — Other Ambulatory Visit: Payer: Self-pay | Admitting: Adult Health

## 2020-02-02 DIAGNOSIS — Z1231 Encounter for screening mammogram for malignant neoplasm of breast: Secondary | ICD-10-CM

## 2020-02-27 DIAGNOSIS — Z23 Encounter for immunization: Secondary | ICD-10-CM | POA: Diagnosis not present

## 2020-03-09 DIAGNOSIS — D225 Melanocytic nevi of trunk: Secondary | ICD-10-CM | POA: Diagnosis not present

## 2020-03-09 DIAGNOSIS — L814 Other melanin hyperpigmentation: Secondary | ICD-10-CM | POA: Diagnosis not present

## 2020-03-09 DIAGNOSIS — L905 Scar conditions and fibrosis of skin: Secondary | ICD-10-CM | POA: Diagnosis not present

## 2020-03-09 DIAGNOSIS — D1801 Hemangioma of skin and subcutaneous tissue: Secondary | ICD-10-CM | POA: Diagnosis not present

## 2020-03-09 DIAGNOSIS — Z85828 Personal history of other malignant neoplasm of skin: Secondary | ICD-10-CM | POA: Diagnosis not present

## 2020-03-09 DIAGNOSIS — L821 Other seborrheic keratosis: Secondary | ICD-10-CM | POA: Diagnosis not present

## 2020-03-29 DIAGNOSIS — Z23 Encounter for immunization: Secondary | ICD-10-CM | POA: Diagnosis not present

## 2020-04-25 ENCOUNTER — Encounter: Payer: Self-pay | Admitting: Internal Medicine

## 2020-04-25 NOTE — Patient Instructions (Signed)

## 2020-04-25 NOTE — Progress Notes (Signed)
Annual Screening/Preventative Visit & Comprehensive Evaluation &  Examination      This very nice 69 y.o.  MWF presents for a Screening /Preventative Visit & comprehensive evaluation and management of multiple medical co-morbidities.  Patient has been followed for HTN, HLD, Prediabetes  and Vitamin D Deficiency.       Labile HTN predates circa 2001 with Tx started in 2009. Patient's BP has been controlled at home and patient denies any cardiac symptoms as chest pain, palpitations, shortness of breath, dizziness or ankle swelling. Today's BP is at goal - 122/80.       Patient's hyperlipidemia is controlled with diet and Atorvastatin. Patient denies myalgias or other medication SE's. Last lipids were at goal with a high HDL (107) and low LDL (88):  Lab Results  Component Value Date   CHOL 210 (H) 12/03/2019   HDL 107 12/03/2019   LDLCALC 88 12/03/2019   TRIG 65 12/03/2019   CHOLHDL 2.0 12/03/2019       Patient has hx/o prediabetes (A1c 5.7% /2015 & 5.9% /2016)and patient denies reactive hypoglycemic symptoms, visual blurring, diabetic polys or paresthesias. Last A1c was Normal & at goal:  Lab Results  Component Value Date   HGBA1C 5.3 12/03/2019        Finally, patient has history of Vitamin D Deficiency ("27" /2009)and last Vitamin D was near goal (70-100):  Lab Results  Component Value Date   VD25OH 56 12/03/2019    Current Outpatient Medications on File Prior to Visit  Medication Sig  . ALPRAZolam 0.5 MG tablet Take 1 tablet  3  times daily as needed for sleep or anxiety.  Marland Kitchen amLODipine  2.5 MG tablet Take 1 tablet Daily for BP  . atorvastatin 40 MG tablet Take  in the evening twice a week for cholesterol.  . ASTELIN nasal spray Use 1 to 2 sprays each nostril 1 to 2 x /day as Needed  . BABY ASPIRIN  Take  daily.  Marland Kitchen BIOTIN  Take 10,000 mg  daily.  . Calcium Carbonate-Vitamin D  Take 600 mg by mouth daily.   . Cetirizine /ZYRTEC  Take by mouth.  Marland Kitchen VITAMIN D Take  4,000 Units  2 times daily.   Marland Kitchen CINNAMON  Take 1,000 mg  daily.  Marland Kitchen estradiol 1 MG tablet TAKE 1 TABLET DAILY  . Multiple Vitamins-Minerals  Take With calcium  . RETIN-A  0.1 % cream APPLICATION TO FACE AT BEDTIME   . triamcinolone crm  0.1 % APPLY TO AFFECTED AREA TWICE A DAY   . vitamin C  500 MG tablet Take  daily.  . Zinc 50 MG CAPS Take  daily.     Allergies  Allergen Reactions  . Codeine     Past Medical History:  Diagnosis Date  . Allergy   . DDD (degenerative disc disease), lumbar   . Depression   . Hyperlipidemia   . Hypertension   . Osteopenia   . Personal history of squamous cell carcinoma of skin 06/03/2019  . Raynaud disease 2010   Health Maintenance  Topic Date Due  . MAMMOGRAM  04/24/2021  . TETANUS/TDAP  09/28/2021  . COLONOSCOPY  09/22/2024  . INFLUENZA VACCINE  Completed  . DEXA SCAN  Completed  . COVID-19 Vaccine  Completed  . Hepatitis C Screening  Completed  . PNA vac Low Risk Adult  Completed   Immunization History  Administered Date(s) Administered  . Influenza Inj Mdck Quad Pf 03/04/2019  . Influenza-Unspecified 03/14/2018, 02/27/2020  .  PFIZER SARS-COV-2 Vaccination 07/03/2019, 07/25/2019, 03/29/2020  . Pneumococcal Conjugate-13 12/07/2016  . Pneumococcal Polysaccharide-23 03/04/2019  . Pneumococcal-Unspecified 02/13/2003  . Tdap 09/29/2011  . Varicella Zoster Immune Globulin 11/22/2015  . Zoster Recombinat (Shingrix) 03/14/2018, 05/20/2018    Last Colon - 09/23/2019- Dr Romilda Garret - recc 5 yr f/u due Apr /May 2026  MGM & dexaBMD scheduled for 05/25/2020  Past Surgical History:  Procedure Laterality Date  . ABDOMINAL HYSTERECTOMY    . RHINOPLASTY     Family History  Problem Relation Age of Onset  . Diabetes Mother   . Hypertension Mother   . Arthritis Mother   . Stroke Mother        Dec 2016  . Arthritis Father        PMR  . Cancer Father        colon  . Hypertension Father   . Macular degeneration Father   . Stroke Father         due to TA   Social History   Tobacco Use  . Smoking status: Never Smoker  . Smokeless tobacco: Never Used  Substance Use Topics  . Alcohol use: Yes    Comment: Rare  . Drug use: No    ROS Constitutional: Denies fever, chills, weight loss/gain, headaches, insomnia,  night sweats, and change in appetite. Does c/o fatigue. Eyes: Denies redness, blurred vision, diplopia, discharge, itchy, watery eyes.  ENT: Denies discharge, congestion, post nasal drip, epistaxis, sore throat, earache, hearing loss, dental pain, Tinnitus, Vertigo, Sinus pain, snoring.  Cardio: Denies chest pain, palpitations, irregular heartbeat, syncope, dyspnea, diaphoresis, orthopnea, PND, claudication, edema Respiratory: denies cough, dyspnea, DOE, pleurisy, hoarseness, laryngitis, wheezing.  Gastrointestinal: Denies dysphagia, heartburn, reflux, water brash, pain, cramps, nausea, vomiting, bloating, diarrhea, constipation, hematemesis, melena, hematochezia, jaundice, hemorrhoids Genitourinary: Denies dysuria, frequency, urgency, nocturia, hesitancy, discharge, hematuria, flank pain Breast: Breast lumps, nipple discharge, bleeding.  Musculoskeletal: Denies arthralgia, myalgia, stiffness, Jt. Swelling, pain, limp, and strain/sprain. Denies falls. Skin: Denies puritis, rash, hives, warts, acne, eczema, changing in skin lesion Neuro: No weakness, tremor, incoordination, spasms, paresthesia, pain Psychiatric: Denies confusion, memory loss, sensory loss. Denies Depression. Endocrine: Denies change in weight, skin, hair change, nocturia, and paresthesia, diabetic polys, visual blurring, hyper / hypo glycemic episodes.  Heme/Lymph: No excessive bleeding, bruising, enlarged lymph nodes.  Physical Exam  BP 122/80   Pulse 63   Temp (!) 96.1 F (35.6 C)   Resp 16   Ht 5' 1.5" (1.562 m)   Wt 120 lb 12.8 oz (54.8 kg)   SpO2 99%   BMI 22.46 kg/m   General Appearance: Well nourished, well groomed and in no apparent  distress.  Eyes: PERRLA, EOMs, conjunctiva no swelling or erythema, normal fundi and vessels. Sinuses: No frontal/maxillary tenderness ENT/Mouth: EACs patent / TMs  nl. Nares clear without erythema, swelling, mucoid exudates. Oral hygiene is good. No erythema, swelling, or exudate. Tongue normal, non-obstructing. Tonsils not swollen or erythematous. Hearing normal.  Neck: Supple, thyroid not palpable. No bruits, nodes or JVD. Respiratory: Respiratory effort normal.  BS equal and clear bilateral without rales, rhonci, wheezing or stridor. Cardio: Heart sounds are normal with regular rate and rhythm and no murmurs, rubs or gallops. Peripheral pulses are normal and equal bilaterally without edema. No aortic or femoral bruits. Chest: symmetric with normal excursions and percussion. Breasts: Symmetric, without lumps, nipple discharge, retractions, or fibrocystic changes.  Abdomen: Flat, soft with bowel sounds active. Nontender, no guarding, rebound, hernias, masses, or organomegaly.  Lymphatics: Non tender  without lymphadenopathy.  Musculoskeletal: Full ROM all peripheral extremities, joint stability, 5/5 strength, and normal gait. Skin: Warm and dry without rashes, lesions, cyanosis, clubbing or  ecchymosis.  Neuro: Cranial nerves intact, reflexes equal bilaterally. Normal muscle tone, no cerebellar symptoms. Sensation intact.  Pysch: Alert and oriented X 3, normal affect, Insight and Judgment appropriate.   Assessment and Plan  1. Essential hypertension  - EKG 12-Lead - Urinalysis, Routine w reflex microscopic - Microalbumin / creatinine urine ratio - CBC with Differential/Platelet - COMPLETE METABOLIC PANEL WITH GFR - Magnesium - TSH  2. Hyperlipidemia, mixed  - EKG 12-Lead - Lipid panel - TSH  3. Abnormal glucose  - EKG 12-Lead - Hemoglobin A1c - Insulin, random  4. Vitamin D deficiency  - VITAMIN D 25 Hydroxy  5. Screening for ischemic heart disease  - EKG 12-Lead  6.  FH: hypertension  - EKG 12-Lead  7. Medication management  - Urinalysis, Routine w reflex microscopic - Microalbumin / creatinine urine ratio - CBC with Differential/Platelet - COMPLETE METABOLIC PANEL WITH GFR - Magnesium - Lipid panel - TSH - Hemoglobin A1c - Insulin, random - VITAMIN D 25 Hydroxy         Patient was counseled in prudent diet to achieve/maintain BMI less than 25 for weight control, BP monitoring, regular exercise and medications. Discussed med's effects and SE's. Screening labs and tests as requested with regular follow-up as recommended. Over 40 minutes of exam, counseling, chart review and high complex critical decision making was performed.   Kirtland Bouchard, MD

## 2020-04-26 ENCOUNTER — Ambulatory Visit (INDEPENDENT_AMBULATORY_CARE_PROVIDER_SITE_OTHER): Payer: Medicare Other | Admitting: Internal Medicine

## 2020-04-26 ENCOUNTER — Other Ambulatory Visit: Payer: Self-pay

## 2020-04-26 VITALS — BP 122/80 | HR 63 | Temp 96.1°F | Resp 16 | Ht 61.5 in | Wt 120.8 lb

## 2020-04-26 DIAGNOSIS — Z8249 Family history of ischemic heart disease and other diseases of the circulatory system: Secondary | ICD-10-CM

## 2020-04-26 DIAGNOSIS — Z79899 Other long term (current) drug therapy: Secondary | ICD-10-CM

## 2020-04-26 DIAGNOSIS — R7309 Other abnormal glucose: Secondary | ICD-10-CM | POA: Diagnosis not present

## 2020-04-26 DIAGNOSIS — Z136 Encounter for screening for cardiovascular disorders: Secondary | ICD-10-CM | POA: Diagnosis not present

## 2020-04-26 DIAGNOSIS — I10 Essential (primary) hypertension: Secondary | ICD-10-CM

## 2020-04-26 DIAGNOSIS — F419 Anxiety disorder, unspecified: Secondary | ICD-10-CM

## 2020-04-26 DIAGNOSIS — E559 Vitamin D deficiency, unspecified: Secondary | ICD-10-CM

## 2020-04-26 DIAGNOSIS — E782 Mixed hyperlipidemia: Secondary | ICD-10-CM | POA: Diagnosis not present

## 2020-04-26 DIAGNOSIS — J069 Acute upper respiratory infection, unspecified: Secondary | ICD-10-CM

## 2020-04-26 MED ORDER — ALPRAZOLAM 0.5 MG PO TABS: ORAL_TABLET | ORAL | 0 refills | Status: DC

## 2020-04-26 MED ORDER — AZELASTINE HCL 0.1 % NA SOLN: NASAL | 3 refills | Status: DC

## 2020-04-27 LAB — MAGNESIUM: Magnesium: 2.4 mg/dL (ref 1.5–2.5)

## 2020-04-27 LAB — COMPLETE METABOLIC PANEL WITH GFR
AG Ratio: 1.7 (calc) (ref 1.0–2.5)
ALT: 29 U/L (ref 6–29)
AST: 30 U/L (ref 10–35)
Albumin: 4.5 g/dL (ref 3.6–5.1)
Alkaline phosphatase (APISO): 114 U/L (ref 37–153)
BUN: 14 mg/dL (ref 7–25)
CO2: 28 mmol/L (ref 20–32)
Calcium: 9.9 mg/dL (ref 8.6–10.4)
Chloride: 105 mmol/L (ref 98–110)
Creat: 0.89 mg/dL (ref 0.50–0.99)
GFR, Est African American: 77 mL/min/{1.73_m2} (ref >=60)
GFR, Est Non African American: 67 mL/min/{1.73_m2} (ref >=60)
Globulin: 2.6 g/dL (calc) (ref 1.9–3.7)
Glucose, Bld: 100 mg/dL — ABNORMAL HIGH (ref 65–99)
Potassium: 4.6 mmol/L (ref 3.5–5.3)
Sodium: 143 mmol/L (ref 135–146)
Total Bilirubin: 0.5 mg/dL (ref 0.2–1.2)
Total Protein: 7.1 g/dL (ref 6.1–8.1)

## 2020-04-27 LAB — MICROALBUMIN / CREATININE URINE RATIO
Creatinine, Urine: 26 mg/dL (ref 20–275)
Microalb Creat Ratio: 8 mcg/mg creat (ref <30)
Microalb, Ur: 0.2 mg/dL

## 2020-04-27 LAB — URINALYSIS, ROUTINE W REFLEX MICROSCOPIC
Bilirubin Urine: NEGATIVE
Glucose, UA: NEGATIVE
Hgb urine dipstick: NEGATIVE
Ketones, ur: NEGATIVE
Leukocytes,Ua: NEGATIVE
Nitrite: NEGATIVE
Protein, ur: NEGATIVE
Specific Gravity, Urine: 1.007 (ref 1.001–1.03)
pH: 7 (ref 5.0–8.0)

## 2020-04-27 LAB — CBC WITH DIFFERENTIAL/PLATELET
Absolute Monocytes: 614 cells/uL (ref 200–950)
Basophils Absolute: 31 cells/uL (ref 0–200)
Basophils Relative: 0.5 %
Eosinophils Absolute: 180 cells/uL (ref 15–500)
Eosinophils Relative: 2.9 %
HCT: 41.6 % (ref 35.0–45.0)
Hemoglobin: 13.8 g/dL (ref 11.7–15.5)
Lymphs Abs: 2666 cells/uL (ref 850–3900)
MCH: 31.9 pg (ref 27.0–33.0)
MCHC: 33.2 g/dL (ref 32.0–36.0)
MCV: 96.1 fL (ref 80.0–100.0)
MPV: 11 fL (ref 7.5–12.5)
Monocytes Relative: 9.9 %
Neutro Abs: 2709 cells/uL (ref 1500–7800)
Neutrophils Relative %: 43.7 %
Platelets: 277 10*3/uL (ref 140–400)
RBC: 4.33 10*6/uL (ref 3.80–5.10)
RDW: 12.4 % (ref 11.0–15.0)
Total Lymphocyte: 43 %
WBC: 6.2 10*3/uL (ref 3.8–10.8)

## 2020-04-27 LAB — HEMOGLOBIN A1C
Hgb A1c MFr Bld: 5.5 % of total Hgb (ref <5.7)
Mean Plasma Glucose: 111 (calc)
eAG (mmol/L): 6.2 (calc)

## 2020-04-27 LAB — LIPID PANEL
Cholesterol: 218 mg/dL — ABNORMAL HIGH (ref <200)
HDL: 117 mg/dL (ref >=50)
LDL Cholesterol (Calc): 84 mg/dL (calc)
Non-HDL Cholesterol (Calc): 101 mg/dL (calc) (ref <130)
Total CHOL/HDL Ratio: 1.9 (calc) (ref <5.0)
Triglycerides: 81 mg/dL (ref <150)

## 2020-04-27 LAB — TSH: TSH: 2.04 mIU/L (ref 0.40–4.50)

## 2020-04-27 LAB — INSULIN, RANDOM: Insulin: 5.7 u[IU]/mL

## 2020-04-27 LAB — VITAMIN D 25 HYDROXY (VIT D DEFICIENCY, FRACTURES): Vit D, 25-Hydroxy: 60 ng/mL (ref 30–100)

## 2020-04-27 NOTE — Progress Notes (Signed)
========================================================== -   Test results slightly outside the reference range are not unusual. If there is anything important, I will review this with you,  otherwise it is considered normal test values.  If you have further questions,  please do not hesitate to contact me at the office or via My Chart.  ==========================================================  -  Total Chol = 218 - OK since have such a "super" high   HDL level of 117  ! ! !  ==========================================================  -  Bad LDL Chol = 84 - is low - which is also Excellent   ! ==========================================================  -  A1c - Normal - Great - No Diabetes ==========================================================  -  Vitamin D =60 - Great  ==========================================================  -  All Else - CBC - Kidneys - Electrolytes - Liver - Magnesium & Thyroid    - all  Normal / OK ==========================================================

## 2020-05-12 ENCOUNTER — Other Ambulatory Visit: Payer: Self-pay

## 2020-05-12 ENCOUNTER — Ambulatory Visit
Admission: RE | Admit: 2020-05-12 | Discharge: 2020-05-12 | Disposition: A | Discharge status: Home / Self Care | Payer: Medicare Other | Source: Ambulatory Visit | Attending: Adult Health | Admitting: Adult Health

## 2020-05-12 DIAGNOSIS — Z1231 Encounter for screening mammogram for malignant neoplasm of breast: Secondary | ICD-10-CM | POA: Diagnosis not present

## 2020-05-12 DIAGNOSIS — Z780 Asymptomatic menopausal state: Secondary | ICD-10-CM | POA: Diagnosis not present

## 2020-05-12 DIAGNOSIS — M8580 Other specified disorders of bone density and structure, unspecified site: Secondary | ICD-10-CM

## 2020-05-12 DIAGNOSIS — M85852 Other specified disorders of bone density and structure, left thigh: Secondary | ICD-10-CM | POA: Diagnosis not present

## 2020-05-12 DIAGNOSIS — E2839 Other primary ovarian failure: Secondary | ICD-10-CM

## 2020-05-17 ENCOUNTER — Other Ambulatory Visit: Payer: Self-pay | Admitting: Adult Health

## 2020-05-17 DIAGNOSIS — N6489 Other specified disorders of breast: Secondary | ICD-10-CM

## 2020-05-18 DIAGNOSIS — C44519 Basal cell carcinoma of skin of other part of trunk: Secondary | ICD-10-CM | POA: Diagnosis not present

## 2020-05-18 DIAGNOSIS — L820 Inflamed seborrheic keratosis: Secondary | ICD-10-CM | POA: Diagnosis not present

## 2020-05-25 ENCOUNTER — Ambulatory Visit
Admission: RE | Admit: 2020-05-25 | Discharge: 2020-05-25 | Disposition: A | Discharge status: Home / Self Care | Payer: Medicare Other | Source: Ambulatory Visit | Attending: Adult Health | Admitting: Adult Health

## 2020-05-25 ENCOUNTER — Other Ambulatory Visit: Payer: Self-pay

## 2020-05-25 DIAGNOSIS — N6489 Other specified disorders of breast: Secondary | ICD-10-CM

## 2020-05-25 DIAGNOSIS — R922 Inconclusive mammogram: Secondary | ICD-10-CM | POA: Diagnosis not present

## 2020-06-02 ENCOUNTER — Ambulatory Visit: Payer: Medicare Other | Admitting: Adult Health

## 2020-06-03 ENCOUNTER — Ambulatory Visit (INDEPENDENT_AMBULATORY_CARE_PROVIDER_SITE_OTHER): Payer: Medicare Other | Admitting: Adult Health Nurse Practitioner

## 2020-06-03 ENCOUNTER — Encounter: Payer: Self-pay | Admitting: Adult Health Nurse Practitioner

## 2020-06-03 ENCOUNTER — Other Ambulatory Visit: Payer: Self-pay

## 2020-06-03 VITALS — BP 108/78 | HR 66 | Temp 97.4°F | Resp 16 | Ht 61.5 in | Wt 125.2 lb

## 2020-06-03 DIAGNOSIS — R6889 Other general symptoms and signs: Secondary | ICD-10-CM | POA: Diagnosis not present

## 2020-06-03 DIAGNOSIS — M51369 Other intervertebral disc degeneration, lumbar region without mention of lumbar back pain or lower extremity pain: Secondary | ICD-10-CM

## 2020-06-03 DIAGNOSIS — F419 Anxiety disorder, unspecified: Secondary | ICD-10-CM | POA: Diagnosis not present

## 2020-06-03 DIAGNOSIS — I7300 Raynaud's syndrome without gangrene: Secondary | ICD-10-CM

## 2020-06-03 DIAGNOSIS — Z79899 Other long term (current) drug therapy: Secondary | ICD-10-CM

## 2020-06-03 DIAGNOSIS — E559 Vitamin D deficiency, unspecified: Secondary | ICD-10-CM | POA: Diagnosis not present

## 2020-06-03 DIAGNOSIS — M5136 Other intervertebral disc degeneration, lumbar region: Secondary | ICD-10-CM

## 2020-06-03 DIAGNOSIS — E782 Mixed hyperlipidemia: Secondary | ICD-10-CM

## 2020-06-03 DIAGNOSIS — Z6823 Body mass index (BMI) 23.0-23.9, adult: Secondary | ICD-10-CM | POA: Diagnosis not present

## 2020-06-03 DIAGNOSIS — Z85828 Personal history of other malignant neoplasm of skin: Secondary | ICD-10-CM | POA: Diagnosis not present

## 2020-06-03 DIAGNOSIS — I10 Essential (primary) hypertension: Secondary | ICD-10-CM

## 2020-06-03 DIAGNOSIS — Z0001 Encounter for general adult medical examination with abnormal findings: Secondary | ICD-10-CM | POA: Diagnosis not present

## 2020-06-03 DIAGNOSIS — F325 Major depressive disorder, single episode, in full remission: Secondary | ICD-10-CM | POA: Diagnosis not present

## 2020-06-03 DIAGNOSIS — M8580 Other specified disorders of bone density and structure, unspecified site: Secondary | ICD-10-CM | POA: Diagnosis not present

## 2020-06-03 DIAGNOSIS — R7309 Other abnormal glucose: Secondary | ICD-10-CM

## 2020-06-03 DIAGNOSIS — Z0000 Encounter for general adult medical examination without abnormal findings: Secondary | ICD-10-CM

## 2020-06-03 NOTE — Progress Notes (Signed)
MEDICARE ANNUAL WELLNESS VISIT  Assessment:   Encounter for Medicare annual wellness exam Yearly  Essential hypertension Continue current medications: Amlodapine 2.5mg  Monitor blood pressure at home; call if consistently over 130/80 Continue DASH diet.   Reminder to go to the ER if any CP, SOB, nausea, dizziness, severe HA, changes vision/speech, left arm numbness and tingling and jaw pain.  Raynaud's disease without gangrene Continue norvasc, stay warm Avoid triggers  Osteopenia, unspecified location Osteopenia Continue Vit D and Ca  weight bearing exercises  DDD (degenerative disc disease), lumbar Doing well at this time monitor  Hyperlipidemia, unspecified hyperlipidemia type At goal with current medications;  Continue Atorvastatin 80mg  half tablet LDL goal <100; monitor  -check lipids q3-83m, decrease fatty foods, increase activity.   Depression, major, in remission ([place]) - remains in remission off of medications; continue to monitor closely - stress management techniques discussed, increase water, good sleep hygiene discussed, increase exercise, and increase veggies.   Allergic state, subsequent encounter - Allegra OTC, increase H20, allergy hygiene explained.  Stress incontinence monitor  Other abnormal glucose Recent A1Cs at goal Discussed diet/exercise, weight management  Defer A1C; monitor serum glucose and weight trends  Vitamin D deficiency Continue supplement  Personal history of squamous cell carcinoma R chest removed 01/2020; continue derm follow up as recommended  Anxiety Doing well on current regiment Continue alprazolam 0.5mg  PRN Discussed stress management techniques   Discussed good sleep hygiene Discussed increasing physical activity and exercise Increase water intake  BMI 23.0-23.9 Discussed dietary and exercise modifications  Medication Management Continued  Over 30 minutes of face to face interview, exam, counseling, chart  review and critical decision making was performed.  Future Appointments  Date Time Provider [place]  08/04/2020  8:45 AM Liane Comber, NP [facility] None  11/09/2020  9:30 AM Unk Pinto, MD [facility] None  05/11/2021  2:00 PM Unk Pinto, MD [facility] None  06/06/2021  3:30 PM Garnet Sierras, NP [facility] None   Plan:   During the course of the visit the patient was educated and counseled about appropriate screening and preventive services including:    Pneumococcal vaccine   Prevnar 13  Influenza vaccine  Td vaccine  Screening electrocardiogram  Bone densitometry screening  Colorectal cancer screening  Diabetes screening  Glaucoma screening  Nutrition counseling   Advanced directives: requested   Subjective:  Alicia Gates is a 69 y.o. female who presents for Medicare Annual Wellness Visit and follow up.   She retired in June 2020, Copywriter, advertising, deferring travel plans.  She is on estrogen, s/p hysterectomy, on bASA.   Follows with derm/ Dr. Ledell Peoples office annually; recently had squamous cell removed from R chest and doing well. 10/21 last OV.  She had left plantar fascitis/mid foot tendonitis followed by Dr. Paulla Dolly at [facility]  BMI is Body mass index is 23.27 kg/m., she has been working on diet and exercise, runs 40-50 min three days a week.  Wt Readings from Last 3 Encounters:  06/03/20 125 lb 3.2 oz (56.8 kg)  04/26/20 120 lb 12.8 oz (54.8 kg)  12/03/19 124 lb 12.8 oz (56.6 kg)   Her blood pressure has been controlled at home, today their BP is BP: 108/78  She does workout. She denies chest pain, shortness of breath, dizziness.   She is on cholesterol medication (on atorvastatin 40 mg twice daily in the evening due to myalgias with every other day dosing). Her LDL cholesterol is at goal. The cholesterol last visit was:  Lab Results  Component Value Date   CHOL 218 (H) 04/26/2020   HDL 117 04/26/2020   LDLCALC 84  04/26/2020   TRIG 81 04/26/2020   CHOLHDL 1.9 04/26/2020    She has been working on diet and exercise for glucose management, and denies paresthesia of the feet, polydipsia, polyuria and visual disturbances. Last A1C in the office was:  Lab Results  Component Value Date   HGBA1C 5.5 04/26/2020   Patient is on Vitamin D supplement.   Lab Results  Component Value Date   VD25OH 60 04/26/2020     Lab Results  Component Value Date   GFRNONAA 67 04/26/2020      Medication Review: Current Outpatient Medications on File Prior to Visit  Medication Sig Dispense Refill  . ALPRAZolam (XANAX) 0.5 MG tablet Take      1/2 - 1 tablet       2 - 3 x /day        ONLY       if needed for Anxiety Attack or Sleep &  limit to 5 days /week to avoid Addiction & Dementia 90 tablet 0  . amLODipine (NORVASC) 2.5 MG tablet Take 1 tablet Daily for BP 90 tablet 3  . atorvastatin (LIPITOR) 40 MG tablet Take 40 mg by mouth in the evening twice a week for cholesterol. 90 tablet 1  . atorvastatin (LIPITOR) 80 MG tablet TAKE 1/2 TO 1 TABLET DAILY FOR CHOLESTEROL OR AS      DIRECTED 90 tablet 1  . azelastine (ASTELIN) 0.1 % nasal spray Use 1 to 2 sprays each nostril 1 to 2 x /day as Needed 90 mL 3  . BABY ASPIRIN PO Take 81 mg by mouth daily.    Marland Kitchen BIOTIN PO Take 10,000 mg by mouth daily.    . Calcium Carbonate-Vitamin D (CALCIUM + D PO) Take 600 mg by mouth daily.     . Cetirizine HCl (ZYRTEC ALLERGY PO) Take by mouth.    . Cholecalciferol (VITAMIN D PO) Take 4,000 Int'l Units by mouth 2 (two) times daily.     Marland Kitchen CINNAMON PO Take 1,000 mg by mouth daily.    Marland Kitchen estradiol (ESTRACE) 1 MG tablet TAKE 1 TABLET DAILY 90 tablet 3  . Multiple Vitamins-Minerals (MULTIVITAMIN PO) Take by mouth. With calcium    . tretinoin (RETIN-A) 0.1 % cream APPLICATION THIN LAYER TO FACE AT BEDTIME WASH OFF EACH AM  5  . triamcinolone cream (KENALOG) 0.1 % APPLY TO AFFECTED AREA TWICE A DAY AS NEEDED  2  . vitamin C (ASCORBIC ACID) 500 MG  tablet Take 500 mg by mouth daily.    . Zinc 50 MG CAPS Take by mouth daily.     No current facility-administered medications on file prior to visit.    Allergies  Allergen Reactions  . Codeine     Current Problems (verified) Patient Active Problem List   Diagnosis Date Noted  . Personal history of squamous cell carcinoma of skin 06/03/2019  . Medication management 01/04/2017  . Abnormal glucose 11/22/2015  . Stress incontinence 11/13/2014  . Hyperlipidemia, mixed   . Essential hypertension   . Depression, major, in remission ([facility])   . Allergy   . Osteopenia   . Raynaud disease   . DDD (degenerative disc disease), lumbar     Screening Tests Immunization History  Administered Date(s) Administered  . Influenza Inj Mdck Quad Pf 03/04/2019  . Influenza-Unspecified 03/14/2018, 02/27/2020  . PFIZER SARS-COV-2 Vaccination 07/03/2019, 07/25/2019,  03/29/2020  . Pneumococcal Conjugate-13 12/07/2016  . Pneumococcal Polysaccharide-23 03/04/2019  . Pneumococcal-Unspecified 02/13/2003  . Tdap 09/29/2011  . Varicella Zoster Immune Globulin 11/22/2015  . Zoster Recombinat (Shingrix) 03/14/2018, 05/20/2018   Tetanus: 2013 Pneumovax: 2004, 2020 Prevnar 13: 2018 Flu vaccine: 02/2020 Zostavax: 2017 Shingrix 2019  Pap: 11/2015 normal, does not need another MGM: 04/2019 hx of U/S left breast benign DEXA: May 2016 fem T -1.3 Osteopenia  Q 2 years Colonoscopy: 09/2019,  Dr. Kathrine Cords, Q5years EGD: N/A  CXR 08/2008  Names of Other Physician/Practitioners you currently use: 1. [facility] here for primary care 2. [facility], eye doctor, last visit 2019, looking for new provider, monitoring mild cataracts 3. Dr. Lendon Ka, dentist 2021 4. Roxan Hockey, last visit 2021, had squamous cell from chest removed 02/2019 Going 06/2020 for cataract evaluation  Patient Care Team: Lucky Cowboy, MD as PCP - General (Internal  Medicine) Sheran Luz, MD as Consulting Physician (Physical Medicine and Rehabilitation) Mariah Milling, Tollie Pizza, MD as Consulting Physician (Cardiology) Bernette Redbird, MD as Consulting Physician (Gastroenterology) Cherlyn Roberts, MD as Consulting Physician (Dermatology)  SURGICAL HISTORY She  has a past surgical history that includes Abdominal hysterectomy and Rhinoplasty. FAMILY HISTORY Her family history includes Arthritis in her father and mother; Cancer in her father; Diabetes in her mother; Hypertension in her father and mother; Macular degeneration in her father; Stroke in her father and mother. SOCIAL HISTORY She  reports that she has never smoked. She has never used smokeless tobacco. She reports current alcohol use. She reports that she does not use drugs.   MEDICARE WELLNESS OBJECTIVES: Physical activity: Current Exercise Habits: Home exercise routine, Type of exercise: walking, Time (Minutes): 20, Frequency (Times/Week): 3, Weekly Exercise (Minutes/Week): 60, Intensity: Mild, Exercise limited by: None identified Cardiac risk factors: Cardiac Risk Factors include: advanced age (>50men, >62 women);dyslipidemia;hypertension Depression/mood screen:   Depression screen [facility] 2/9 06/03/2020  Decreased Interest 0  Down, Depressed, Hopeless 0  PHQ - 2 Score 0    ADLs:  In your present state of health, do you have any difficulty performing the following activities: 06/03/2020 04/25/2020  Hearing? N N  Vision? N N  Difficulty concentrating or making decisions? N N  Walking or climbing stairs? N N  Dressing or bathing? N N  Doing errands, shopping? N N  Preparing Food and eating ? N -  Using the Toilet? N -  In the past six months, have you accidently leaked urine? N -  Do you have problems with loss of bowel control? N -  Managing your Medications? N -  Managing your Finances? N -  Housekeeping or managing your Housekeeping? N -  Some recent data might be hidden      Cognitive Testing  Alert? Yes  Normal Appearance?Yes  Oriented to person? Yes  Place? Yes   Time? Yes  Recall of three objects?  Yes  Can perform simple calculations? Yes  Displays appropriate judgment?Yes  Can read the correct time from a watch face?Yes  EOL planning: Does Patient Have a Medical Advance Directive?: Yes Type of Advance Directive: Healthcare Power of Attorney,Living will Does patient want to make changes to medical advance directive?: No - Patient declined Copy of Healthcare Power of Attorney in Chart?: No - copy requested  Review of Systems  Constitutional: Negative.  Negative for malaise/fatigue and weight loss.  HENT: Negative.  Negative for hearing loss and tinnitus.   Eyes: Negative.  Negative for blurred vision and double  vision.  Respiratory: Negative.  Negative for cough, sputum production, shortness of breath and wheezing.   Cardiovascular: Negative.  Negative for chest pain, palpitations, orthopnea, claudication, leg swelling and PND.  Gastrointestinal: Negative.  Negative for abdominal pain, blood in stool, constipation, diarrhea, heartburn, melena, nausea and vomiting.  Genitourinary: Negative.  Negative for dysuria and urgency.  Musculoskeletal: Negative for back pain, falls, joint pain (left foot), myalgias and neck pain.  Skin: Negative.  Negative for rash.  Neurological: Negative for dizziness, tingling, sensory change, weakness and headaches.  Endo/Heme/Allergies: Negative for polydipsia.  Psychiatric/Behavioral: Negative.  Negative for depression, memory loss, substance abuse and suicidal ideas. The patient is not nervous/anxious and does not have insomnia.   All other systems reviewed and are negative.    Objective:     Today's Vitals   06/03/20 1610  BP: 108/78  Pulse: 66  Resp: 16  Temp: (!) 97.4 F (36.3 C)  SpO2: 99%  Weight: 125 lb 3.2 oz (56.8 kg)  Height: 5' 1.5" (1.562 m)   Body mass index is 23.27 kg/m.  General  appearance: alert, no distress, WD/WN, female HEENT: normocephalic, sclerae anicteric, TMs pearly, nares patent, no discharge or erythema, Oral exam deferred; mask in place Oral cavity: Deferred Neck: supple, no lymphadenopathy, no thyromegaly, no masses Heart: RRR, normal S1, S2, no murmurs Lungs: CTA bilaterally, no wheezes, rhonchi, or rales Abdomen: +bs, soft, non tender, non distended, no masses, no hepatomegaly, no splenomegaly Musculoskeletal: nontender, no swelling, no obvious deformity Extremities: no edema, no cyanosis, no clubbing Pulses: 2+ symmetric, upper and lower extremities, normal cap refill Neurological: alert, oriented x 3, CN2-12 intact, strength normal upper extremities and lower extremities, sensation normal throughout, DTRs 2+ throughout, no cerebellar signs, gait normal Psychiatric: normal affect, behavior normal, pleasant   Medicare Attestation I have personally reviewed: The patient's medical and social history Their use of alcohol, tobacco or illicit drugs Their current medications and supplements The patient's functional ability including ADLs,fall risks, home safety risks, cognitive, and hearing and visual impairment Diet and physical activities Evidence for depression or mood disorders  The patient's weight, height, BMI, and visual acuity have been recorded in the chart.  I have made referrals, counseling, and provided education to the patient based on review of the above and I have provided the patient with a written personalized care plan for preventive services.     Elder Negus, NP   06/03/2020

## 2020-06-08 DIAGNOSIS — H2511 Age-related nuclear cataract, right eye: Secondary | ICD-10-CM | POA: Diagnosis not present

## 2020-06-08 DIAGNOSIS — H25013 Cortical age-related cataract, bilateral: Secondary | ICD-10-CM | POA: Diagnosis not present

## 2020-06-08 DIAGNOSIS — H18413 Arcus senilis, bilateral: Secondary | ICD-10-CM | POA: Diagnosis not present

## 2020-06-08 DIAGNOSIS — H25043 Posterior subcapsular polar age-related cataract, bilateral: Secondary | ICD-10-CM | POA: Diagnosis not present

## 2020-06-08 DIAGNOSIS — H2513 Age-related nuclear cataract, bilateral: Secondary | ICD-10-CM | POA: Diagnosis not present

## 2020-07-05 DIAGNOSIS — H2511 Age-related nuclear cataract, right eye: Secondary | ICD-10-CM | POA: Diagnosis not present

## 2020-07-05 HISTORY — PX: CATARACT EXTRACTION: SUR2

## 2020-07-06 DIAGNOSIS — H2512 Age-related nuclear cataract, left eye: Secondary | ICD-10-CM | POA: Diagnosis not present

## 2020-07-13 ENCOUNTER — Other Ambulatory Visit: Payer: Self-pay | Admitting: Internal Medicine

## 2020-07-13 MED ORDER — AMLODIPINE BESYLATE 2.5 MG PO TABS: ORAL_TABLET | ORAL | 1 refills | Status: DC

## 2020-07-26 DIAGNOSIS — H2512 Age-related nuclear cataract, left eye: Secondary | ICD-10-CM | POA: Diagnosis not present

## 2020-07-26 HISTORY — PX: CATARACT EXTRACTION: SUR2

## 2020-08-02 DIAGNOSIS — F419 Anxiety disorder, unspecified: Secondary | ICD-10-CM | POA: Insufficient documentation

## 2020-08-02 NOTE — Progress Notes (Deleted)
3 MONTH FOLLOW UP  Assessment:     Essential hypertension Continue current medications: Amlodapine 2.5mg  Monitor blood pressure at home; call if consistently over 130/80 Continue DASH diet.   Reminder to go to the ER if any CP, SOB, nausea, dizziness, severe HA, changes vision/speech, left arm numbness and tingling and jaw pain.  Hyperlipidemia, unspecified hyperlipidemia type At goal with current medications;  Continue Atorvastatin 80mg  half tablet LDL goal <100; monitor  -check lipids q3-72m, decrease fatty foods, increase activity.   Depression, major, in remission ([place]) - remains in remission off of medications; continue to monitor closely - stress management techniques discussed, increase water, good sleep hygiene discussed, increase exercise, and increase veggies.   Other abnormal glucose Recent A1Cs at goal Discussed diet/exercise, weight management  Defer A1C; monitor serum glucose and weight trends  Vitamin D deficiency Continue supplement  Anxiety Well managed by current regimen; continue medications Stress management techniques discussed, increase water, good sleep hygiene discussed, increase exercise, and increase veggies.   BMI 23.0-23.9 Discussed dietary and exercise modifications   Over 30 minutes of face to face interview, exam, counseling, chart review and critical decision making was performed.  Future Appointments  Date Time Provider [place]  08/04/2020  8:45 AM Liane Comber, NP [facility] None  11/09/2020  9:30 AM Unk Pinto, MD [facility] None  05/11/2021  2:00 PM Unk Pinto, MD [facility] None  06/06/2021  3:30 PM Garnet Sierras, NP [facility] None     Subjective:  Alicia Gates is a 70 y.o. female who presents for 3 month follow up on htn, hyperlipideami, depression, vit D def.   She retired in June 2020, Copywriter, advertising, deferring travel plans.  She has hx of depression in remission off of daily agent; she is  prescribed xanax PRN ***  BMI is There is no height or weight on file to calculate BMI., she has been working on diet and exercise, runs 40-50 min three days a week.  Wt Readings from Last 3 Encounters:  06/03/20 125 lb 3.2 oz (56.8 kg)  04/26/20 120 lb 12.8 oz (54.8 kg)  12/03/19 124 lb 12.8 oz (56.6 kg)   Her blood pressure has been controlled at home, today their BP is    She does workout. She denies chest pain, shortness of breath, dizziness.   She is on cholesterol medication (on atorvastatin 40 mg twice weekly in the evening due to myalgias with every other day dosing). Her LDL cholesterol is at goal. The cholesterol last visit was:   Lab Results  Component Value Date   CHOL 218 (H) 04/26/2020   HDL 117 04/26/2020   LDLCALC 84 04/26/2020   TRIG 81 04/26/2020   CHOLHDL 1.9 04/26/2020    She has been working on diet and exercise for glucose management, and denies paresthesia of the feet, polydipsia, polyuria and visual disturbances. Last A1C in the office was:  Lab Results  Component Value Date   HGBA1C 5.5 04/26/2020   Patient is on Vitamin D supplement.   Lab Results  Component Value Date   VD25OH 60 04/26/2020     Lab Results  Component Value Date   GFRNONAA 67 04/26/2020      Medication Review: Current Outpatient Medications on File Prior to Visit  Medication Sig Dispense Refill  . ALPRAZolam (XANAX) 0.5 MG tablet Take      1/2 - 1 tablet       2 - 3 x /day        ONLY  if needed for Anxiety Attack or Sleep &  limit to 5 days /week to avoid Addiction & Dementia 90 tablet 0  . amLODipine (NORVASC) 2.5 MG tablet Take 1 tablet Daily for BP 90 tablet 1  . atorvastatin (LIPITOR) 40 MG tablet Take 40 mg by mouth in the evening twice a week for cholesterol. 90 tablet 1  . atorvastatin (LIPITOR) 80 MG tablet TAKE 1/2 TO 1 TABLET DAILY FOR CHOLESTEROL OR AS      DIRECTED 90 tablet 1  . azelastine (ASTELIN) 0.1 % nasal spray Use 1 to 2 sprays each nostril 1 to 2 x /day  as Needed 90 mL 3  . BABY ASPIRIN PO Take 81 mg by mouth daily.    Marland Kitchen BIOTIN PO Take 10,000 mg by mouth daily.    . Calcium Carbonate-Vitamin D (CALCIUM + D PO) Take 600 mg by mouth daily.     . Cetirizine HCl (ZYRTEC ALLERGY PO) Take by mouth.    . Cholecalciferol (VITAMIN D PO) Take 4,000 Int'l Units by mouth 2 (two) times daily.     Marland Kitchen CINNAMON PO Take 1,000 mg by mouth daily.    Marland Kitchen estradiol (ESTRACE) 1 MG tablet TAKE 1 TABLET DAILY 90 tablet 3  . Multiple Vitamins-Minerals (MULTIVITAMIN PO) Take by mouth. With calcium    . tretinoin (RETIN-A) 0.1 % cream APPLICATION THIN LAYER TO FACE AT BEDTIME WASH OFF EACH AM  5  . triamcinolone cream (KENALOG) 0.1 % APPLY TO AFFECTED AREA TWICE A DAY AS NEEDED  2  . vitamin C (ASCORBIC ACID) 500 MG tablet Take 500 mg by mouth daily.    . Zinc 50 MG CAPS Take by mouth daily.     No current facility-administered medications on file prior to visit.    Allergies  Allergen Reactions  . Codeine     Current Problems (verified) Patient Active Problem List   Diagnosis Date Noted  . Personal history of squamous cell carcinoma of skin 06/03/2019  . Medication management 01/04/2017  . Abnormal glucose 11/22/2015  . Stress incontinence 11/13/2014  . Hyperlipidemia, mixed   . Essential hypertension   . Depression, major, in remission ([place])   . Allergy   . Osteopenia   . Raynaud disease   . DDD (degenerative disc disease), lumbar     Screening Tests Immunization History  Administered Date(s) Administered  . Influenza Inj Mdck Quad Pf 03/04/2019  . Influenza-Unspecified 03/14/2018, 02/27/2020  . PFIZER(Purple Top)SARS-COV-2 Vaccination 07/03/2019, 07/25/2019, 03/29/2020  . Pneumococcal Conjugate-13 12/07/2016  . Pneumococcal Polysaccharide-23 03/04/2019  . Pneumococcal-Unspecified 02/13/2003  . Tdap 09/29/2011  . Varicella Zoster Immune Globulin 11/22/2015  . Zoster Recombinat (Shingrix) 03/14/2018, 05/20/2018   Tetanus: 2013 Pneumovax:  2004, 2020 Prevnar 13: 2018 Flu vaccine: 02/2020 Zostavax: 2017 Shingrix 2019  Pap: 11/2015 normal, does not need another MGM: 04/2019 hx of U/S left breast benign DEXA: May 2016 fem T -1.3 Osteopenia  Q 2 years Colonoscopy: 09/2019,  Dr. Sundra Aland, Q5years EGD: N/A  CXR 08/2008  Names of Other Physician/Practitioners you currently use: 1. [facility] here for primary care 2. [facility], eye doctor, last visit 2019, looking for new provider, monitoring mild cataracts 3. Dr. Noah Charon, dentist 2021 4. Lyndle Herrlich, last visit 2021, had squamous cell from chest removed 02/2019 Going 06/2020 for cataract evaluation  Patient Care Team: Unk Pinto, MD as PCP - General (Internal Medicine) Suella Broad, MD as Consulting Physician (Physical Medicine and Rehabilitation) Rockey Situ, Kathlene November, MD  as Consulting Physician (Cardiology) Ronald Lobo, MD as Consulting Physician (Gastroenterology) Druscilla Brownie, MD as Consulting Physician (Dermatology)  SURGICAL HISTORY She  has a past surgical history that includes Abdominal hysterectomy and Rhinoplasty. FAMILY HISTORY Her family history includes Arthritis in her father and mother; Cancer in her father; Diabetes in her mother; Hypertension in her father and mother; Macular degeneration in her father; Stroke in her father and mother. SOCIAL HISTORY She  reports that she has never smoked. She has never used smokeless tobacco. She reports current alcohol use. She reports that she does not use drugs.   Review of Systems  Constitutional: Negative.  Negative for malaise/fatigue and weight loss.  HENT: Negative.  Negative for hearing loss and tinnitus.   Eyes: Negative.  Negative for blurred vision and double vision.  Respiratory: Negative.  Negative for cough, sputum production, shortness of breath and wheezing.   Cardiovascular: Negative.  Negative for chest pain, palpitations,  orthopnea, claudication, leg swelling and PND.  Gastrointestinal: Negative.  Negative for abdominal pain, blood in stool, constipation, diarrhea, heartburn, melena, nausea and vomiting.  Genitourinary: Negative.  Negative for dysuria and urgency.  Musculoskeletal: Negative for back pain, falls, joint pain (left foot), myalgias and neck pain.  Skin: Negative.  Negative for rash.  Neurological: Negative for dizziness, tingling, sensory change, weakness and headaches.  Endo/Heme/Allergies: Negative for polydipsia.  Psychiatric/Behavioral: Negative.  Negative for depression, memory loss, substance abuse and suicidal ideas. The patient is not nervous/anxious and does not have insomnia.   All other systems reviewed and are negative.    Objective:     There were no vitals filed for this visit. There is no height or weight on file to calculate BMI.  General appearance: alert, no distress, WD/WN, female HEENT: normocephalic, sclerae anicteric, TMs pearly, nares patent, no discharge or erythema, Oral exam deferred; mask in place Oral cavity: Deferred Neck: supple, no lymphadenopathy, no thyromegaly, no masses Heart: RRR, normal S1, S2, no murmurs Lungs: CTA bilaterally, no wheezes, rhonchi, or rales Abdomen: +bs, soft, non tender, non distended, no masses, no hepatomegaly, no splenomegaly Musculoskeletal: nontender, no swelling, no obvious deformity Extremities: no edema, no cyanosis, no clubbing Pulses: 2+ symmetric, upper and lower extremities, normal cap refill Neurological: alert, oriented x 3, CN2-12 intact, strength normal upper extremities and lower extremities, sensation normal throughout, DTRs 2+ throughout, no cerebellar signs, gait normal Psychiatric: normal affect, behavior normal, pleasant      Izora Ribas, NP   08/02/2020

## 2020-08-04 ENCOUNTER — Ambulatory Visit (INDEPENDENT_AMBULATORY_CARE_PROVIDER_SITE_OTHER): Payer: PPO | Admitting: Adult Health

## 2020-08-04 ENCOUNTER — Other Ambulatory Visit: Payer: Self-pay

## 2020-08-04 ENCOUNTER — Encounter: Payer: Self-pay | Admitting: Adult Health

## 2020-08-04 VITALS — BP 122/76 | HR 46 | Temp 96.5°F | Wt 126.0 lb

## 2020-08-04 DIAGNOSIS — M5136 Other intervertebral disc degeneration, lumbar region: Secondary | ICD-10-CM | POA: Diagnosis not present

## 2020-08-04 DIAGNOSIS — Z0000 Encounter for general adult medical examination without abnormal findings: Secondary | ICD-10-CM

## 2020-08-04 DIAGNOSIS — R7309 Other abnormal glucose: Secondary | ICD-10-CM | POA: Diagnosis not present

## 2020-08-04 DIAGNOSIS — E782 Mixed hyperlipidemia: Secondary | ICD-10-CM

## 2020-08-04 DIAGNOSIS — N393 Stress incontinence (female) (male): Secondary | ICD-10-CM

## 2020-08-04 DIAGNOSIS — M8580 Other specified disorders of bone density and structure, unspecified site: Secondary | ICD-10-CM

## 2020-08-04 DIAGNOSIS — Z0001 Encounter for general adult medical examination with abnormal findings: Secondary | ICD-10-CM

## 2020-08-04 DIAGNOSIS — R6889 Other general symptoms and signs: Secondary | ICD-10-CM | POA: Diagnosis not present

## 2020-08-04 DIAGNOSIS — F325 Major depressive disorder, single episode, in full remission: Secondary | ICD-10-CM | POA: Diagnosis not present

## 2020-08-04 DIAGNOSIS — F419 Anxiety disorder, unspecified: Secondary | ICD-10-CM | POA: Diagnosis not present

## 2020-08-04 DIAGNOSIS — I10 Essential (primary) hypertension: Secondary | ICD-10-CM

## 2020-08-04 DIAGNOSIS — Z79899 Other long term (current) drug therapy: Secondary | ICD-10-CM

## 2020-08-04 DIAGNOSIS — I7300 Raynaud's syndrome without gangrene: Secondary | ICD-10-CM

## 2020-08-04 DIAGNOSIS — Z85828 Personal history of other malignant neoplasm of skin: Secondary | ICD-10-CM

## 2020-08-04 DIAGNOSIS — M51369 Other intervertebral disc degeneration, lumbar region without mention of lumbar back pain or lower extremity pain: Secondary | ICD-10-CM

## 2020-08-04 DIAGNOSIS — T7840XD Allergy, unspecified, subsequent encounter: Secondary | ICD-10-CM

## 2020-08-04 MED ORDER — ATORVASTATIN CALCIUM 40 MG PO TABS: ORAL_TABLET | ORAL | 3 refills | Status: DC

## 2020-08-04 NOTE — Progress Notes (Signed)
MEDICARE ANNUAL WELLNESS VISIT  Assessment:   Encounter for Medicare annual wellness exam Yearly  Essential hypertension Continue current medications: Amlodapine 2.5mg  Monitor blood pressure at home; call if consistently over 130/80 Continue DASH diet.   Reminder to go to the ER if any CP, SOB, nausea, dizziness, severe HA, changes vision/speech, left arm numbness and tingling and jaw pain.  Raynaud's disease without gangrene Continue norvasc, stay warm Avoid triggers  Osteopenia, unspecified location Osteopenia- get dexa after 05/2022, continue Vit D and Ca, weight bearing exercises Suggested K2 supplement   DDD (degenerative disc disease), lumbar Doing well at this time monitor  Hyperlipidemia, unspecified hyperlipidemia type At goal with current medications;  Continue Atorvastatin 40 mg twice weekly; myalgias with higher frequency LDL goal <100; monitor  -check lipids q3-19m, decrease fatty foods, increase activity.   Depression, major, in remission ([place]) - remains in remission off of medications; continue to monitor closely - stress management techniques discussed, increase water, good sleep hygiene discussed, increase exercise, and increase veggies.   Allergic state, subsequent encounter - Allegra OTC, increase H20, allergy hygiene explained.  Stress incontinence monitor  Other abnormal glucose Recent A1Cs at goal Discussed diet/exercise, weight management  Defer A1C; monitor serum glucose and weight trends  Vitamin D deficiency Continue supplement  Personal history of squamous cell carcinoma R chest removed 01/2020; continue derm follow up as recommended  Anxiety Well managed by current regimen; continue medications; continue to limit benzo Stress management techniques discussed, increase water, good sleep hygiene discussed, increase exercise, and increase veggies.   BMI 23.0-23.9 Discussed dietary and exercise modifications  Medication  Management Continued  Just had labs 3 months ago, no concerns, no changes - discussed and defer to next OV in 3 months.   Over 30 minutes of face to face interview, exam, counseling, chart review and critical decision making was performed.  Future Appointments  Date Time Provider [place]  11/09/2020  9:30 AM Unk Pinto, MD [facility] None  05/11/2021  2:00 PM Unk Pinto, MD [facility] None  08/15/2021  9:30 AM Liane Comber, NP [facility] None   Plan:   During the course of the visit the patient was educated and counseled about appropriate screening and preventive services including:    Pneumococcal vaccine   Prevnar 13  Influenza vaccine  Td vaccine  Screening electrocardiogram  Bone densitometry screening  Colorectal cancer screening  Diabetes screening  Glaucoma screening  Nutrition counseling   Advanced directives: requested   Subjective:  Alicia Gates is a 70 y.o. female who presents for Medicare Annual Wellness Visit and follow up.   She retired in June 2020, Copywriter, advertising, deferring travel plans.  She is on estrogen, s/p hysterectomy, on bASA.   Follows with derm/ Dr. Ledell Peoples office annually; recently had squamous cell removed from R chest and doing well. 10/21 last OV.   She had left plantar fascitis/mid foot tendonitis followed by Dr. Paulla Dolly at [facility]  Reports had bil cataracts removed by Dr. Tommy Rainwater Early 2022, doing well without complications.   BMI is Body mass index is 23.42 kg/m., she has been working on diet and exercise, runs 40-50 min three days a week.  Wt Readings from Last 3 Encounters:  08/04/20 126 lb (57.2 kg)  06/03/20 125 lb 3.2 oz (56.8 kg)  04/26/20 120 lb 12.8 oz (54.8 kg)   Her blood pressure has been controlled at home, today their BP is BP: 122/76  She does workout. She denies chest pain, shortness of  breath, dizziness.   She is on cholesterol medication (on atorvastatin 40 mg twice  daily in the evening due to myalgias with every other day dosing). Her LDL cholesterol is at goal. The cholesterol last visit was:   Lab Results  Component Value Date   CHOL 218 (H) 04/26/2020   HDL 117 04/26/2020   LDLCALC 84 04/26/2020   TRIG 81 04/26/2020   CHOLHDL 1.9 04/26/2020    She has been working on diet and exercise for glucose management, and denies paresthesia of the feet, polydipsia, polyuria and visual disturbances. Last A1C in the office was:  Lab Results  Component Value Date   HGBA1C 5.5 04/26/2020   Patient is on Vitamin D supplement.   Lab Results  Component Value Date   VD25OH 60 04/26/2020     Lab Results  Component Value Date   GFRNONAA 67 04/26/2020      Medication Review: Current Outpatient Medications on File Prior to Visit  Medication Sig Dispense Refill  . ALPRAZolam (XANAX) 0.5 MG tablet Take      1/2 - 1 tablet       2 - 3 x /day        ONLY       if needed for Anxiety Attack or Sleep &  limit to 5 days /week to avoid Addiction & Dementia 90 tablet 0  . amLODipine (NORVASC) 2.5 MG tablet Take 1 tablet Daily for BP 90 tablet 1  . azelastine (ASTELIN) 0.1 % nasal spray Use 1 to 2 sprays each nostril 1 to 2 x /day as Needed 90 mL 3  . BABY ASPIRIN PO Take 81 mg by mouth daily.    Marland Kitchen BIOTIN PO Take 10,000 mg by mouth daily.    . Calcium Carbonate-Vitamin D (CALCIUM + D PO) Take 600 mg by mouth daily.     . Cetirizine HCl (ZYRTEC ALLERGY PO) Take by mouth.    . Cholecalciferol (VITAMIN D PO) Take 4,000 Int'l Units by mouth 2 (two) times daily.     Marland Kitchen CINNAMON PO Take 1,000 mg by mouth daily.    Marland Kitchen estradiol (ESTRACE) 1 MG tablet TAKE 1 TABLET DAILY 90 tablet 3  . Multiple Vitamins-Minerals (MULTIVITAMIN PO) Take by mouth. With calcium    . tretinoin (RETIN-A) 0.1 % cream APPLICATION THIN LAYER TO FACE AT BEDTIME WASH OFF EACH AM  5  . triamcinolone cream (KENALOG) 0.1 % APPLY TO AFFECTED AREA TWICE A DAY AS NEEDED  2  . vitamin C (ASCORBIC ACID) 500 MG  tablet Take 500 mg by mouth daily.    . Zinc 50 MG CAPS Take by mouth daily.     No current facility-administered medications on file prior to visit.    Allergies  Allergen Reactions  . Codeine     Current Problems (verified) Patient Active Problem List   Diagnosis Date Noted  . Anxiety 08/02/2020  . Personal history of squamous cell carcinoma of skin 06/03/2019  . Medication management 01/04/2017  . Abnormal glucose 11/22/2015  . Stress incontinence 11/13/2014  . Hyperlipidemia, mixed   . Essential hypertension   . Depression, major, in remission ([place])   . Allergy   . Osteopenia   . Raynaud disease   . DDD (degenerative disc disease), lumbar     Screening Tests Immunization History  Administered Date(s) Administered  . Influenza Inj Mdck Quad Pf 03/04/2019  . Influenza-Unspecified 03/14/2018, 02/27/2020  . PFIZER(Purple Top)SARS-COV-2 Vaccination 07/03/2019, 07/25/2019, 03/29/2020  . Pneumococcal Conjugate-13 12/07/2016  .  Pneumococcal Polysaccharide-23 03/04/2019  . Pneumococcal-Unspecified 02/13/2003  . Tdap 09/29/2011  . Varicella Zoster Immune Globulin 11/22/2015  . Zoster Recombinat (Shingrix) 03/14/2018, 05/20/2018   Tetanus: 2013 Pneumovax: 2004, 2020 Prevnar 13: 2018 Flu vaccine: 02/2020 Zostavax: 2017 Shingrix 2019 Covid 19: 3/3, 2021, pfizer  Pap: 11/2015 normal, does not need another MGM: 05/25/2020 DEXA: 05/12/2020 fem T -1.3 Osteopenia - unchanged Colonoscopy: 09/2019,  Dr. Sundra Aland, [facility] EGD: N/A  Names of Other Physician/Practitioners you currently use: 1. [facility] here for primary care 2. Dr. Tommy Rainwater, eye doctor, last visit 2022, had cataracts remoted 3. Dr. Noah Charon, dentist 2021 4. Lyndle Herrlich, last visit 2022, had squamous cell from chest removed 02/2019  Patient Care Team: Unk Pinto, MD as PCP - General (Internal Medicine) Suella Broad, MD as Consulting Physician (Physical  Medicine and Rehabilitation) Rockey Situ, Kathlene November, MD as Consulting Physician (Cardiology) Ronald Lobo, MD as Consulting Physician (Gastroenterology) Druscilla Brownie, MD as Consulting Physician (Dermatology)  SURGICAL HISTORY She  has a past surgical history that includes Abdominal hysterectomy; Rhinoplasty; Cataract extraction (Right, 07/05/2020); and Cataract extraction (Left, 07/26/2020). FAMILY HISTORY Her family history includes Arthritis in her father and mother; Cancer in her father; Diabetes in her mother; Hypertension in her father and mother; Macular degeneration in her father; Stroke in her father and mother. SOCIAL HISTORY She  reports that she has never smoked. She has never used smokeless tobacco. She reports current alcohol use. She reports that she does not use drugs.   MEDICARE WELLNESS OBJECTIVES: Physical activity: Current Exercise Habits: Home exercise routine, Type of exercise: treadmill, Time (Minutes): 50, Frequency (Times/Week): 3, Weekly Exercise (Minutes/Week): 150, Intensity: Moderate, Exercise limited by: None identified Cardiac risk factors: Cardiac Risk Factors include: advanced age (>2men, >73 women);dyslipidemia;hypertension Depression/mood screen:   Depression screen [facility] 2/9 08/04/2020  Decreased Interest 0  Down, Depressed, Hopeless 0  PHQ - 2 Score 0  Altered sleeping 0  Tired, decreased energy 0  Change in appetite 0  Feeling bad or failure about yourself  0  Trouble concentrating 0  Moving slowly or fidgety/restless 0  Suicidal thoughts 0  PHQ-9 Score 0  Difficult doing work/chores Not difficult at all    ADLs:  In your present state of health, do you have any difficulty performing the following activities: 08/04/2020 06/03/2020  Hearing? N N  Vision? N N  Difficulty concentrating or making decisions? N N  Walking or climbing stairs? N N  Dressing or bathing? N N  Doing errands, shopping? N N  Preparing Food and eating ? - N  Using the  Toilet? - N  In the past six months, have you accidently leaked urine? - N  Do you have problems with loss of bowel control? - N  Managing your Medications? - N  Managing your Finances? - N  Housekeeping or managing your Housekeeping? - N  Some recent data might be hidden     Cognitive Testing  Alert? Yes  Normal Appearance?Yes  Oriented to person? Yes  Place? Yes   Time? Yes  Recall of three objects?  Yes  Can perform simple calculations? Yes  Displays appropriate judgment?Yes  Can read the correct time from a watch face?Yes  EOL planning: Does Patient Have a Medical Advance Directive?: Yes Type of Advance Directive: [place] will Does patient want to make changes to medical advance directive?: No - Patient declined Copy of [place] in Chart?: No - copy requested  Review of  Systems  Constitutional: Negative.  Negative for malaise/fatigue and weight loss.  HENT: Negative.  Negative for hearing loss and tinnitus.   Eyes: Negative.  Negative for blurred vision and double vision.  Respiratory: Negative.  Negative for cough, sputum production, shortness of breath and wheezing.   Cardiovascular: Negative.  Negative for chest pain, palpitations, orthopnea, claudication, leg swelling and PND.  Gastrointestinal: Negative.  Negative for abdominal pain, blood in stool, constipation, diarrhea, heartburn, melena, nausea and vomiting.  Genitourinary: Negative.  Negative for dysuria and urgency.  Musculoskeletal: Negative for back pain, falls, joint pain, myalgias and neck pain.  Skin: Negative.  Negative for rash.  Neurological: Negative for dizziness, tingling, sensory change, weakness and headaches.  Endo/Heme/Allergies: Negative for polydipsia.  Psychiatric/Behavioral: Negative.  Negative for depression, memory loss, substance abuse and suicidal ideas. The patient is not nervous/anxious and does not have insomnia.   All other systems reviewed  and are negative.    Objective:     Today's Vitals   08/04/20 0850  BP: 122/76  Pulse: (!) 46  Temp: (!) 96.5 F (35.8 C)  SpO2: 98%  Weight: 126 lb (57.2 kg)   Body mass index is 23.42 kg/m.  General appearance: alert, no distress, WD/WN, female HEENT: normocephalic, sclerae anicteric, TMs pearly, nares patent, no discharge or erythema, Oral hygiene is good. No erythema, swelling, or exudate. Tongue normal, non-obstructing. Tonsils not swollen or erythematous. Hearing normal. Neck: supple, no lymphadenopathy, no thyromegaly, no masses Heart: RRR, normal S1, S2, no murmurs Lungs: CTA bilaterally, no wheezes, rhonchi, or rales Abdomen: +bs, soft, non tender, non distended, no masses, no hepatomegaly, no splenomegaly Musculoskeletal: nontender, no swelling, no obvious deformity Extremities: no edema, no cyanosis, no clubbing Pulses: 2+ symmetric, upper and lower extremities, normal cap refill Neurological: alert, oriented x 3, CN2-12 intact, strength normal upper extremities and lower extremities, sensation normal throughout, DTRs 2+ throughout, no cerebellar signs, gait normal Psychiatric: normal affect, behavior normal, pleasant   Medicare Attestation I have personally reviewed: The patient's medical and social history Their use of alcohol, tobacco or illicit drugs Their current medications and supplements The patient's functional ability including ADLs,fall risks, home safety risks, cognitive, and hearing and visual impairment Diet and physical activities Evidence for depression or mood disorders  The patient's weight, height, BMI, and visual acuity have been recorded in the chart.  I have made referrals, counseling, and provided education to the patient based on review of the above and I have provided the patient with a written personalized care plan for preventive services.     Izora Ribas, NP   08/04/2020

## 2020-08-04 NOTE — Patient Instructions (Signed)
  Alicia Gates , Thank you for taking time to come for your Medicare Wellness Visit. I appreciate your ongoing commitment to your health goals. Please review the following plan we discussed and let me know if I can assist you in the future.   These are the goals we discussed: Goals    . Blood Pressure < 130/80    . LDL CALC < 100       This is a list of the screening recommended for you and due dates:  Health Maintenance  Topic Date Due  . Tetanus Vaccine  09/28/2021  . Mammogram  05/12/2022  . Colon Cancer Screening  09/22/2024  . Flu Shot  Completed  . DEXA scan (bone density measurement)  Completed  . COVID-19 Vaccine  Completed  .  Hepatitis C: One time screening is recommended by Center for Disease Control  (CDC) for  adults born from [birth year] through 1964.   Completed  . Pneumonia vaccines  Completed  . HPV Vaccine  Aged Out

## 2020-08-19 DIAGNOSIS — L910 Hypertrophic scar: Secondary | ICD-10-CM | POA: Diagnosis not present

## 2020-08-19 DIAGNOSIS — Z85828 Personal history of other malignant neoplasm of skin: Secondary | ICD-10-CM | POA: Diagnosis not present

## 2020-08-19 DIAGNOSIS — L905 Scar conditions and fibrosis of skin: Secondary | ICD-10-CM | POA: Diagnosis not present

## 2020-08-19 DIAGNOSIS — L570 Actinic keratosis: Secondary | ICD-10-CM | POA: Diagnosis not present

## 2020-09-27 DIAGNOSIS — M5432 Sciatica, left side: Secondary | ICD-10-CM | POA: Diagnosis not present

## 2020-10-06 DIAGNOSIS — M5459 Other low back pain: Secondary | ICD-10-CM | POA: Diagnosis not present

## 2020-10-06 DIAGNOSIS — M5417 Radiculopathy, lumbosacral region: Secondary | ICD-10-CM | POA: Diagnosis not present

## 2020-11-08 ENCOUNTER — Encounter: Payer: Self-pay | Admitting: Internal Medicine

## 2020-11-08 NOTE — Progress Notes (Addendum)
     R  E  S  C  H  E  D  U  L  E  D  FAMILY    EMERGENCY

## 2020-11-09 ENCOUNTER — Ambulatory Visit: Payer: Medicare Other | Admitting: Internal Medicine

## 2020-11-09 DIAGNOSIS — E782 Mixed hyperlipidemia: Secondary | ICD-10-CM

## 2020-11-09 DIAGNOSIS — R7309 Other abnormal glucose: Secondary | ICD-10-CM

## 2020-11-09 DIAGNOSIS — I10 Essential (primary) hypertension: Secondary | ICD-10-CM

## 2020-11-09 DIAGNOSIS — E559 Vitamin D deficiency, unspecified: Secondary | ICD-10-CM

## 2020-11-09 DIAGNOSIS — Z79899 Other long term (current) drug therapy: Secondary | ICD-10-CM

## 2020-11-16 ENCOUNTER — Encounter: Payer: Self-pay | Admitting: Internal Medicine

## 2020-11-16 ENCOUNTER — Ambulatory Visit (INDEPENDENT_AMBULATORY_CARE_PROVIDER_SITE_OTHER): Payer: PPO | Admitting: Internal Medicine

## 2020-11-16 ENCOUNTER — Other Ambulatory Visit: Payer: Self-pay

## 2020-11-16 VITALS — BP 120/80 | HR 63 | Temp 97.9°F | Resp 16 | Ht 61.0 in | Wt 124.2 lb

## 2020-11-16 DIAGNOSIS — E782 Mixed hyperlipidemia: Secondary | ICD-10-CM | POA: Diagnosis not present

## 2020-11-16 DIAGNOSIS — E559 Vitamin D deficiency, unspecified: Secondary | ICD-10-CM

## 2020-11-16 DIAGNOSIS — R7309 Other abnormal glucose: Secondary | ICD-10-CM

## 2020-11-16 DIAGNOSIS — Z79899 Other long term (current) drug therapy: Secondary | ICD-10-CM | POA: Diagnosis not present

## 2020-11-16 DIAGNOSIS — I10 Essential (primary) hypertension: Secondary | ICD-10-CM

## 2020-11-16 DIAGNOSIS — M8580 Other specified disorders of bone density and structure, unspecified site: Secondary | ICD-10-CM | POA: Diagnosis not present

## 2020-11-16 DIAGNOSIS — B351 Tinea unguium: Secondary | ICD-10-CM | POA: Diagnosis not present

## 2020-11-16 MED ORDER — TERBINAFINE HCL 250 MG PO TABS: ORAL_TABLET | ORAL | 1 refills | Status: DC

## 2020-11-16 MED ORDER — ESTRADIOL 1 MG PO TABS: ORAL_TABLET | ORAL | 3 refills | Status: DC

## 2020-11-16 NOTE — Progress Notes (Signed)
Future Appointments  Date Time Provider [place]  11/16/2020  2:30 PM Unk Pinto, MD [facility] None  05/11/2021  - CPE  2:00 PM Unk Pinto, MD [facility] None  08/15/2021 - Wellness  9:30 AM Liane Comber, NP [facility] None    History of Present Illness:       This very nice 70 y.o. MWF presents for 6  month follow up with HTN, HLD, Pre-Diabetes and Vitamin D Deficiency.        Patient is followed for HTN since 2001 and treatment started in 2009.  BP has been controlled at home. Today's BP is at goal - 120/80. Patient has had no complaints of any cardiac type chest pain, palpitations, dyspnea Vertell Limber /PND, dizziness, claudication, or dependent edema.         Hyperlipidemia is controlled with diet & Lipitor. Patient denies myalgias or other med SE's. Last Lipids were at goal with an extremely high HDL level of 117 !  Lab Results  Component Value Date   CHOL 218 (H) 04/26/2020   HDL 117 04/26/2020   LDLCALC 84 04/26/2020   TRIG 81 04/26/2020   CHOLHDL 1.9 04/26/2020     Also, the patient has history of PreDiabetes (A1c 5.7%  /2015 & 5.9% /2016)   and has had no symptoms of reactive hypoglycemia, diabetic polys, paresthesias or visual blurring.  Last A1c was normal & at goal:  Lab Results  Component Value Date   HGBA1C 5.5 04/26/2020        Further, the patient also has history of Vitamin D Deficiency ("27" /2009) and supplements vitamin D without any suspected side-effects. Last vitamin D was at goal:   Lab Results  Component Value Date   VD25OH 60 04/26/2020     Current Outpatient Medications on File Prior to Visit  Medication Sig   ALPRAZolam (XANAX) 0.5 MG tablet Take      1/2 - 1 tablet       2 - 3 x /day        ONLY       if needed for Anxiety Attack or Sleep &  limit to 5 days /week to avoid Addiction & Dementia   amLODipine (NORVASC) 2.5 MG tablet Take 1 tablet Daily for BP   atorvastatin (LIPITOR) 40 MG tablet Take 40 mg by  mouth in the evening twice a week for cholesterol.   azelastine (ASTELIN) 0.1 % nasal spray Use 1 to 2 sprays each nostril 1 to 2 x /day as Needed   BABY ASPIRIN PO Take 81 mg by mouth daily.   BIOTIN PO Take 10,000 mg by mouth daily.   Calcium Carbonate-Vitamin D (CALCIUM + D PO) Take 600 mg by mouth daily.    Cetirizine HCl (ZYRTEC ALLERGY PO) Take by mouth.   Cholecalciferol (VITAMIN D PO) Take 4,000 Int'l Units by mouth 2 (two) times daily.    CINNAMON PO Take 1,000 mg by mouth daily.   estradiol (ESTRACE) 1 MG tablet TAKE 1 TABLET DAILY   Multiple Vitamins-Minerals (MULTIVITAMIN PO) Take by mouth. With calcium   tretinoin (RETIN-A) 0.1 % cream APPLICATION THIN LAYER TO FACE AT BEDTIME WASH OFF EACH AM   triamcinolone cream (KENALOG) 0.1 % APPLY TO AFFECTED AREA TWICE A DAY AS NEEDED   vitamin C (ASCORBIC ACID) 500 MG tablet Take 500 mg by mouth daily.   Zinc 50 MG CAPS Take by mouth daily.   No current facility-administered medications on file  prior to visit.     Allergies  Allergen Reactions   Codeine      PMHx:   Past Medical History:  Diagnosis Date   Allergy    DDD (degenerative disc disease), lumbar    Depression    Hyperlipidemia    Hypertension    Osteopenia    Personal history of squamous cell carcinoma of skin 06/03/2019   Raynaud disease 2010     Immunization History  Administered Date(s) Administered   Influenza Inj Mdck Quad  03/04/2019   Influenza- 03/14/2018, 02/27/2020   PFIZER  SARS-COV-2 Vaccination 07/03/2019, 07/25/2019, 03/29/2020, 11/10/2020   Pneumococcal -13 12/07/2016   Pneumococcal -23 03/04/2019   Pneumococcal- 23 02/13/2003   Tdap 09/29/2011   Varicella Zoster Immune Globulin 11/22/2015   Zoster Recombinat (Shingrix) 03/14/2018, 05/20/2018     Past Surgical History:  Procedure Laterality Date   ABDOMINAL HYSTERECTOMY     CATARACT EXTRACTION Right 07/05/2020   Dr. Tommy Rainwater   CATARACT EXTRACTION Left 07/26/2020   Dr. Tommy Rainwater    RHINOPLASTY      FHx:    Reviewed / unchanged  SHx:    Reviewed / unchanged   Systems Review:  Constitutional: Denies fever, chills, wt changes, headaches, insomnia, fatigue, night sweats, change in appetite. Eyes: Denies redness, blurred vision, diplopia, discharge, itchy, watery eyes.  ENT: Denies discharge, congestion, post nasal drip, epistaxis, sore throat, earache, hearing loss, dental pain, tinnitus, vertigo, sinus pain, snoring.  CV: Denies chest pain, palpitations, irregular heartbeat, syncope, dyspnea, diaphoresis, orthopnea, PND, claudication or edema. Respiratory: denies cough, dyspnea, DOE, pleurisy, hoarseness, laryngitis, wheezing.  Gastrointestinal: Denies dysphagia, odynophagia, heartburn, reflux, water brash, abdominal pain or cramps, nausea, vomiting, bloating, diarrhea, constipation, hematemesis, melena, hematochezia  or hemorrhoids. Genitourinary: Denies dysuria, frequency, urgency, nocturia, hesitancy, discharge, hematuria or flank pain. Musculoskeletal: Denies arthralgias, myalgias, stiffness, jt. swelling, pain, limping or strain/sprain.  Skin: Denies pruritus, rash, hives, warts, acne, eczema or change in skin lesion(s). Neuro: No weakness, tremor, incoordination, spasms, paresthesia or pain. Psychiatric: Denies confusion, memory loss or sensory loss. Endo: Denies change in weight, skin or hair change.  Heme/Lymph: No excessive bleeding, bruising or enlarged lymph nodes.  Physical Exam  BP 120/80   Pulse 63   Temp 97.9 F (36.6 C)   Resp 16   Ht 5\' 1"  (1.549 m)   Wt 124 lb 3.2 oz (56.3 kg)   SpO2 99%   BMI 23.47 kg/m   Appears  well nourished, well groomed  and in no distress.  Eyes: PERRLA, EOMs, conjunctiva no swelling or erythema. Sinuses: No frontal/maxillary tenderness ENT/Mouth: EAC's clear, TM's nl w/o erythema, bulging. Nares clear w/o erythema, swelling, exudates. Oropharynx clear without erythema or exudates. Oral hygiene is good. Tongue  normal, non obstructing. Hearing intact.  Neck: Supple. Thyroid not palpable. Car 2+/2+ without bruits, nodes or JVD. Chest: Respirations nl with BS clear & equal w/o rales, rhonchi, wheezing or stridor.  Cor: Heart sounds normal w/ regular rate and rhythm without sig. murmurs, gallops, clicks or rubs. Peripheral pulses normal and equal  without edema.  Abdomen: Soft & bowel sounds normal. Non-tender w/o guarding, rebound, hernias, masses or organomegaly.  Lymphatics: Unremarkable.  Musculoskeletal: Full ROM all peripheral extremities, joint stability, 5/5 strength and normal gait.  Skin: Warm, dry without exposed rashes, lesions or ecchymosis apparent.  Neuro: Cranial nerves intact, reflexes equal bilaterally. Sensory-motor testing grossly intact. Tendon reflexes grossly intact.  Pysch: Alert & oriented x 3.  Insight and judgement nl & appropriate. No  ideations.  Assessment and Plan:  1. Essential hypertension  - Continue medication, monitor blood pressure at home.  - Continue DASH diet.  Reminder to go to the ER if any CP,  SOB, nausea, dizziness, severe HA, changes vision/speech. - CBC with Differential/Platelet - COMPLETE METABOLIC PANEL WITH GFR - Magnesium - TSH  2. Hyperlipidemia, mixed  - Continue diet/meds, exercise,& lifestyle modifications.  - Continue monitor periodic cholesterol/liver & renal functions  - Lipid panel - TSH  3. Abnormal glucose  - Continue diet, exercise  - Lifestyle modifications.  - Monitor appropriate labs - Hemoglobin A1c - Insulin, random  4. Vitamin D deficiency  - Continue supplementation  - VITAMIN D 25 Hydroxy  5. Medication management  - CBC with Differential/Platelet - COMPLETE METABOLIC PANEL WITH GFR - Magnesium - Lipid panel - TSH - Hemoglobin A1c - Insulin, random - VITAMIN D 25 Hydroxy           Discussed  regular exercise, BP monitoring, weight control to achieve/maintain BMI less than 25 and discussed med and  SE's. Recommended labs to assess and monitor clinical status with further disposition pending results of labs.  I discussed the assessment and treatment plan with the patient. The patient was provided an opportunity to ask questions and all were answered. The patient agreed with the plan and demonstrated an understanding of the instructions.  I provided over 30 minutes of exam, counseling, chart review and  complex critical decision making.        The patient was advised to call back or seek an in-person evaluation if the symptoms worsen or if the condition fails to improve as anticipated.   Kirtland Bouchard, MD

## 2020-11-16 NOTE — Patient Instructions (Signed)

## 2020-11-17 LAB — LIPID PANEL
Cholesterol: 206 mg/dL — ABNORMAL HIGH (ref <200)
HDL: 98 mg/dL (ref >=50)
LDL Cholesterol (Calc): 91 mg/dL (calc)
Non-HDL Cholesterol (Calc): 108 mg/dL (calc) (ref <130)
Total CHOL/HDL Ratio: 2.1 (calc) (ref <5.0)
Triglycerides: 81 mg/dL (ref <150)

## 2020-11-17 LAB — COMPLETE METABOLIC PANEL WITH GFR
AG Ratio: 1.6 (calc) (ref 1.0–2.5)
ALT: 29 U/L (ref 6–29)
AST: 25 U/L (ref 10–35)
Albumin: 4.2 g/dL (ref 3.6–5.1)
Alkaline phosphatase (APISO): 107 U/L (ref 37–153)
BUN: 15 mg/dL (ref 7–25)
CO2: 29 mmol/L (ref 20–32)
Calcium: 10 mg/dL (ref 8.6–10.4)
Chloride: 102 mmol/L (ref 98–110)
Creat: 0.86 mg/dL (ref 0.50–0.99)
GFR, Est African American: 80 mL/min/{1.73_m2} (ref >=60)
GFR, Est Non African American: 69 mL/min/{1.73_m2} (ref >=60)
Globulin: 2.6 g/dL (calc) (ref 1.9–3.7)
Glucose, Bld: 80 mg/dL (ref 65–99)
Potassium: 4.4 mmol/L (ref 3.5–5.3)
Sodium: 141 mmol/L (ref 135–146)
Total Bilirubin: 0.4 mg/dL (ref 0.2–1.2)
Total Protein: 6.8 g/dL (ref 6.1–8.1)

## 2020-11-17 LAB — TSH: TSH: 1.08 mIU/L (ref 0.40–4.50)

## 2020-11-17 LAB — CBC WITH DIFFERENTIAL/PLATELET
Absolute Monocytes: 600 cells/uL (ref 200–950)
Basophils Absolute: 32 cells/uL (ref 0–200)
Basophils Relative: 0.4 %
Eosinophils Absolute: 261 cells/uL (ref 15–500)
Eosinophils Relative: 3.3 %
HCT: 41.9 % (ref 35.0–45.0)
Hemoglobin: 13.7 g/dL (ref 11.7–15.5)
Lymphs Abs: 2978 cells/uL (ref 850–3900)
MCH: 31.3 pg (ref 27.0–33.0)
MCHC: 32.7 g/dL (ref 32.0–36.0)
MCV: 95.7 fL (ref 80.0–100.0)
MPV: 10.5 fL (ref 7.5–12.5)
Monocytes Relative: 7.6 %
Neutro Abs: 4029 cells/uL (ref 1500–7800)
Neutrophils Relative %: 51 %
Platelets: 314 10*3/uL (ref 140–400)
RBC: 4.38 10*6/uL (ref 3.80–5.10)
RDW: 12.4 % (ref 11.0–15.0)
Total Lymphocyte: 37.7 %
WBC: 7.9 10*3/uL (ref 3.8–10.8)

## 2020-11-17 LAB — VITAMIN D 25 HYDROXY (VIT D DEFICIENCY, FRACTURES): Vit D, 25-Hydroxy: 81 ng/mL (ref 30–100)

## 2020-11-17 LAB — HEMOGLOBIN A1C
Hgb A1c MFr Bld: 5.7 % of total Hgb — ABNORMAL HIGH (ref <5.7)
Mean Plasma Glucose: 117 mg/dL
eAG (mmol/L): 6.5 mmol/L

## 2020-11-17 LAB — MAGNESIUM: Magnesium: 2.4 mg/dL (ref 1.5–2.5)

## 2020-11-17 LAB — INSULIN, RANDOM: Insulin: 2.9 u[IU]/mL

## 2020-11-17 NOTE — Progress Notes (Signed)
============================================================ -   Test results slightly outside the reference range are not unusual. If there is anything important, I will review this with you,  otherwise it is considered normal test values.  If you have further questions,  please do not hesitate to contact me at the office or via My Chart.  ============================================================ ============================================================  -  Total Chol = 206  is OK, since you have such a "super" high   Good HDL level of 98 and the bad LDL Chol = 91  is at goal less than 100 !   - Very low risk for a cardiac or stroke event or Vascular Alzheimer's ! ============================================================ ============================================================  - A1c = 5.7% or 12 week average sugar is borderline at 117 mg%   - average sugar of 5.6% would be 114 mg%  ============================================================ ============================================================  -  Vitamin D= 81 - Excellent  ! ============================================================ ============================================================  -  All Else - CBC - Kidneys - Electrolytes - Liver - Magnesium & Thyroid    - all  Normal / OK ============================================================ ============================================================  -  Keep up the [place] Work  !  ============================================================ ============================================================

## 2021-01-18 ENCOUNTER — Other Ambulatory Visit: Payer: Self-pay | Admitting: Internal Medicine

## 2021-01-18 ENCOUNTER — Ambulatory Visit (INDEPENDENT_AMBULATORY_CARE_PROVIDER_SITE_OTHER): Payer: PPO

## 2021-01-18 ENCOUNTER — Other Ambulatory Visit: Payer: Self-pay

## 2021-01-18 VITALS — BP 128/78 | HR 78 | Temp 97.6°F | Resp 16 | Ht 61.0 in | Wt 129.2 lb

## 2021-01-18 DIAGNOSIS — B351 Tinea unguium: Secondary | ICD-10-CM

## 2021-01-18 DIAGNOSIS — Z79899 Other long term (current) drug therapy: Secondary | ICD-10-CM

## 2021-01-18 LAB — COMPLETE METABOLIC PANEL WITH GFR
AG Ratio: 1.9 (calc) (ref 1.0–2.5)
ALT: 26 U/L (ref 6–29)
AST: 24 U/L (ref 10–35)
Albumin: 4.2 g/dL (ref 3.6–5.1)
Alkaline phosphatase (APISO): 107 U/L (ref 37–153)
BUN: 13 mg/dL (ref 7–25)
CO2: 29 mmol/L (ref 20–32)
Calcium: 9.5 mg/dL (ref 8.6–10.4)
Chloride: 105 mmol/L (ref 98–110)
Creat: 0.82 mg/dL (ref 0.50–1.05)
Globulin: 2.2 g/dL (calc) (ref 1.9–3.7)
Glucose, Bld: 89 mg/dL (ref 65–99)
Potassium: 4.5 mmol/L (ref 3.5–5.3)
Sodium: 140 mmol/L (ref 135–146)
Total Bilirubin: 0.4 mg/dL (ref 0.2–1.2)
Total Protein: 6.4 g/dL (ref 6.1–8.1)
eGFR: 77 mL/min/{1.73_m2} (ref >=60)

## 2021-01-18 NOTE — Progress Notes (Signed)
The patient came in for her labs per Dr. Melford Aase. She reports that she is continuing to take her Lamisil as prescribed. She does report that she feels she is getting better taking the medication and her only complaint is that the toes on her left foot are slightly swollen with some achy feeling. She was asked to update this office with any worsening or concerning symptoms.

## 2021-01-19 NOTE — Progress Notes (Signed)
============================================================ -   Test results slightly outside the reference range are not unusual. If there is anything important, I will review this with you,  otherwise it is considered normal test values.  If you have further questions,  please do not hesitate to contact me at the office or via My Chart.  ============================================================ ============================================================  -  Kidneys - Electrolytes - Liver - - - all  Normal / OK  -  OK fo continue the Lamisil  ===========================================================

## 2021-01-19 NOTE — Progress Notes (Signed)
============================================================ ============================================================  -    Yep - OK to continue   ============================================================ ============================================================

## 2021-03-22 DIAGNOSIS — C44619 Basal cell carcinoma of skin of left upper limb, including shoulder: Secondary | ICD-10-CM | POA: Diagnosis not present

## 2021-03-22 DIAGNOSIS — L538 Other specified erythematous conditions: Secondary | ICD-10-CM | POA: Diagnosis not present

## 2021-03-22 DIAGNOSIS — D225 Melanocytic nevi of trunk: Secondary | ICD-10-CM | POA: Diagnosis not present

## 2021-03-22 DIAGNOSIS — L298 Other pruritus: Secondary | ICD-10-CM | POA: Diagnosis not present

## 2021-03-22 DIAGNOSIS — L814 Other melanin hyperpigmentation: Secondary | ICD-10-CM | POA: Diagnosis not present

## 2021-03-22 DIAGNOSIS — Z08 Encounter for follow-up examination after completed treatment for malignant neoplasm: Secondary | ICD-10-CM | POA: Diagnosis not present

## 2021-03-22 DIAGNOSIS — D485 Neoplasm of uncertain behavior of skin: Secondary | ICD-10-CM | POA: Diagnosis not present

## 2021-03-22 DIAGNOSIS — L821 Other seborrheic keratosis: Secondary | ICD-10-CM | POA: Diagnosis not present

## 2021-03-22 DIAGNOSIS — Z85828 Personal history of other malignant neoplasm of skin: Secondary | ICD-10-CM | POA: Diagnosis not present

## 2021-03-22 DIAGNOSIS — L570 Actinic keratosis: Secondary | ICD-10-CM | POA: Diagnosis not present

## 2021-03-30 ENCOUNTER — Ambulatory Visit: Payer: PPO | Admitting: Adult Health

## 2021-03-30 ENCOUNTER — Other Ambulatory Visit: Payer: Self-pay

## 2021-03-30 ENCOUNTER — Encounter: Payer: Self-pay | Admitting: Adult Health

## 2021-03-30 DIAGNOSIS — U071 COVID-19: Secondary | ICD-10-CM | POA: Diagnosis not present

## 2021-03-30 MED ORDER — PROMETHAZINE-DM 6.25-15 MG/5ML PO SYRP: 5.0000 mL | ORAL_SOLUTION | Freq: Four times a day (QID) | ORAL | 1 refills | Status: DC | PRN

## 2021-03-30 MED ORDER — DEXAMETHASONE 1 MG PO TABS: ORAL_TABLET | ORAL | 0 refills | Status: DC

## 2021-03-30 NOTE — Progress Notes (Signed)
THIS ENCOUNTER IS A VIRTUAL VISIT DUE TO COVID-19 - PATIENT WAS NOT SEEN IN THE OFFICE.  PATIENT HAS CONSENTED TO VIRTUAL VISIT / TELEMEDICINE VISIT   Virtual Visit via telephone Note  I connected with  Christain [place] on 03/30/2021 by telephone.  I verified that I am speaking with the correct person using two identifiers.    I discussed the limitations of evaluation and management by telemedicine and the availability of in person appointments. The patient expressed understanding and agreed to proceed.  History of Present Illness:  There were no vitals taken for this visit. 70 y.o. patient contacted office reporting URI sx x 3 days. she tested positive by home rapid test yesterday and contacted office. OV was conducted by telephone to minimize exposure. This patient has been vaccinated for covid 19, last boosted 03/22/2021.   Sx began 3 days ago with progressive cough, began mild and has progressive, mostly hacking, mildly/intermittently productive. Worse with lying down. She reports nose congestion, rhinitis, HA, body aches. She has had sense of fever/chills, hasn't checked temp but has been taking tylenol which does help manage sx fairly well.   Treatments tried so far: tylenol and pushing fluids.   Exposures: none known.    Medications  Current Outpatient Medications (Endocrine & Metabolic):    estradiol (ESTRACE) 1 MG tablet, TAKE 1 TABLET DAILY  Current Outpatient Medications (Cardiovascular):    amLODipine (NORVASC) 2.5 MG tablet, Take  1 tablet  Daily  for BP  / Patient knows to take by mouth   atorvastatin (LIPITOR) 40 MG tablet, Take 40 mg by mouth in the evening twice a week for cholesterol.  Current Outpatient Medications (Respiratory):    azelastine (ASTELIN) 0.1 % nasal spray, Use 1 to 2 sprays each nostril 1 to 2 x /day as Needed   Cetirizine HCl (ZYRTEC ALLERGY PO), Take by mouth.  Current Outpatient Medications (Analgesics):    BABY ASPIRIN PO, Take 81 mg by mouth  daily.   Current Outpatient Medications (Other):    ALPRAZolam (XANAX) 0.5 MG tablet, Take      1/2 - 1 tablet       2 - 3 x /day        ONLY       if needed for Anxiety Attack or Sleep &  limit to 5 days /week to avoid Addiction & Dementia   BIOTIN PO, Take 10,000 mg by mouth daily.   Calcium Carbonate-Vitamin D (CALCIUM + D PO), Take 600 mg by mouth daily.    Cholecalciferol (VITAMIN D PO), Take 4,000 Int'l Units by mouth 2 (two) times daily.    CINNAMON PO, Take 1,000 mg by mouth daily.   Multiple Vitamins-Minerals (MULTIVITAMIN PO), Take by mouth. With calcium   terbinafine (LAMISIL) 250 MG tablet, Take  1 tablet  Daily  for Toenail Fungus   tretinoin (RETIN-A) 0.1 % cream, APPLICATION THIN LAYER TO FACE AT BEDTIME WASH OFF EACH AM   triamcinolone cream (KENALOG) 0.1 %, APPLY TO AFFECTED AREA TWICE A DAY AS NEEDED   vitamin C (ASCORBIC ACID) 500 MG tablet, Take 500 mg by mouth daily.   Zinc 50 MG CAPS, Take by mouth daily.  Allergies:  Allergies  Allergen Reactions   Codeine     Problem list She has Hyperlipidemia, mixed; Essential hypertension; Depression, major, in remission ([place]); Allergy; Osteopenia; Raynaud disease; DDD (degenerative disc disease), lumbar; Stress incontinence; Abnormal glucose; Medication management; Personal history of squamous cell carcinoma of skin; and Anxiety on their  problem list.   Social History:   reports that she has never smoked. She has never used smokeless tobacco. She reports current alcohol use. She reports that she does not use drugs.  Observations/Objective:  General : Well sounding patient in no apparent distress HEENT: mildly congested sounding, no cough for duration of visit Lungs: speaks in complete sentences, no audible wheezing, no apparent distress Neurological: alert, oriented x 3 Psychiatric: pleasant, judgement appropriate   Assessment and Plan:  Covid 19 Covid 19 positive per rapid screening test at home Risk factors  include: age Symptoms are: mild Discussed treatment options including antivirals, supportive care, steroid taper. She declines antivirals at this time. She can call back within the next 2 days if progressive and plan to start [email or web address] Continue vit D, C, zinc Take tylenol PRN temp 101+ Push hydration Regular ambulation or calf exercises exercises for clot prevention and 81 mg ASA unless contraindicated Sx supportive therapy suggested Follow up via mychart or telephone if needed Advised patient obtain O2 monitor; present to ED if persistently <90% or with severe dyspnea, CP, fever uncontrolled by tylenol, confusion, sudden decline Should remain in isolation until at least 5 days from onset of sx, 24-48 hours fever free without tylenol, sx such as cough are improved.  -     dexamethasone (DECADRON) 1 MG tablet; Take 3 tabs for 3 days, 2 tabs for 3 days 1 tab for 5 days. Take with food. -     promethazine-dextromethorphan (PROMETHAZINE-DM) 6.25-15 MG/5ML syrup; Take 5 mLs by mouth 4 (four) times daily as needed for cough.   Follow Up Instructions:  I discussed the assessment and treatment plan with the patient. The patient was provided an opportunity to ask questions and all were answered. The patient agreed with the plan and demonstrated an understanding of the instructions.   The patient was advised to call back or seek an in-person evaluation if the symptoms worsen or if the condition fails to improve as anticipated.  I provided 20 minutes of non-face-to-face time during this encounter.   Izora Ribas, NP

## 2021-04-20 DIAGNOSIS — L570 Actinic keratosis: Secondary | ICD-10-CM | POA: Diagnosis not present

## 2021-04-20 DIAGNOSIS — L905 Scar conditions and fibrosis of skin: Secondary | ICD-10-CM | POA: Diagnosis not present

## 2021-04-20 DIAGNOSIS — C44619 Basal cell carcinoma of skin of left upper limb, including shoulder: Secondary | ICD-10-CM | POA: Diagnosis not present

## 2021-05-11 ENCOUNTER — Other Ambulatory Visit: Payer: Self-pay | Admitting: Internal Medicine

## 2021-05-11 ENCOUNTER — Encounter: Payer: Self-pay | Admitting: Internal Medicine

## 2021-05-11 ENCOUNTER — Ambulatory Visit (INDEPENDENT_AMBULATORY_CARE_PROVIDER_SITE_OTHER): Payer: PPO | Admitting: Internal Medicine

## 2021-05-11 ENCOUNTER — Other Ambulatory Visit: Payer: Self-pay

## 2021-05-11 VITALS — BP 122/78 | HR 69 | Temp 97.8°F | Resp 16 | Ht 61.0 in | Wt 125.2 lb

## 2021-05-11 DIAGNOSIS — Z1231 Encounter for screening mammogram for malignant neoplasm of breast: Secondary | ICD-10-CM

## 2021-05-11 DIAGNOSIS — M8580 Other specified disorders of bone density and structure, unspecified site: Secondary | ICD-10-CM

## 2021-05-11 DIAGNOSIS — Z8249 Family history of ischemic heart disease and other diseases of the circulatory system: Secondary | ICD-10-CM | POA: Diagnosis not present

## 2021-05-11 DIAGNOSIS — Z79899 Other long term (current) drug therapy: Secondary | ICD-10-CM | POA: Diagnosis not present

## 2021-05-11 DIAGNOSIS — Z0001 Encounter for general adult medical examination with abnormal findings: Secondary | ICD-10-CM

## 2021-05-11 DIAGNOSIS — R7309 Other abnormal glucose: Secondary | ICD-10-CM | POA: Diagnosis not present

## 2021-05-11 DIAGNOSIS — E559 Vitamin D deficiency, unspecified: Secondary | ICD-10-CM | POA: Diagnosis not present

## 2021-05-11 DIAGNOSIS — E782 Mixed hyperlipidemia: Secondary | ICD-10-CM | POA: Diagnosis not present

## 2021-05-11 DIAGNOSIS — I10 Essential (primary) hypertension: Secondary | ICD-10-CM

## 2021-05-11 DIAGNOSIS — Z1211 Encounter for screening for malignant neoplasm of colon: Secondary | ICD-10-CM

## 2021-05-11 DIAGNOSIS — Z136 Encounter for screening for cardiovascular disorders: Secondary | ICD-10-CM

## 2021-05-11 NOTE — Progress Notes (Signed)
Annual Screening/Preventative Visit & Comprehensive Evaluation &  Examination  Future Appointments  Date Time Provider Department  05/11/2021  2:00 PM Unk Pinto, MD [facility]  08/15/2021      Wellness  9:30 AM Liane Comber, NP [facility]        This very nice 70 y.o. MWF presents for a Screening /Preventative Visit & comprehensive evaluation and management of multiple medical co-morbidities.  Patient has been followed for HTN, HLD, Prediabetes  and Vitamin D Deficiency.        Patient has been followed for labile HTN predates since  2001 with treatment started in 2009.  Patient's BP has been controlled at home and patient denies any cardiac symptoms as chest pain, palpitations, shortness of breath, dizziness or ankle swelling. Today's BP is at goal  - 122/78.        Patient's hyperlipidemia is controlled with diet and Lipitor. Patient denies myalgias or other medication SE's. Last lipids were at goal :  Lab Results  Component Value Date   CHOL 206 (H) 11/16/2020   HDL 98 11/16/2020   LDLCALC 91 11/16/2020   TRIG 81 11/16/2020   CHOLHDL 2.1 11/16/2020         Patient has hx/o prediabetes (A1c 5.7% /2015 & 5.9% /2016) and patient denies reactive hypoglycemic symptoms, visual blurring, diabetic polys or paresthesias. Last A1c was borderline :  Lab Results  Component Value Date   HGBA1C 5.7 (H) 11/16/2020         Finally, patient has history of Vitamin D Deficiency ("27" /2009) and last Vitamin D was at goal :  Lab Results  Component Value Date   VD25OH 81 11/16/2020     Current Outpatient Medications on File Prior to Visit  Medication Sig   ALPRAZolam  0.5 MG tablet Take  1/2 - 1 tablet  2 - 3 x /day if needed for Anxiety Attack    amLODipine 2.5 MG tablet Take  1 tablet  Daily  for BP     atorvastatin  40 MG tablet Take n the evening twice a week for cholesterol.   azelastine 0.1 % nasal spray Use 1 to 2 sprays each nostril 1 to 2 x /day as Needed   BABY  ASPIRIN  Take daily.   BIOTIN 10,000 mg  Take daily.   CALCIUM 600 mg + D  Take daily.    Cetirizine 10 mg Take as needed allergies   VITAMIN D  4,000 Units  Take  2  times daily.    CINNAMON 1,000 mg   Take daily.   estradiol  1 MG tablet TAKE 1 TABLET DAILY   Multiple Vitamins-Minerals  Take daily   terbinafine 250 MG tablet Take  1 tablet  Daily     RETIN-A 0.1 % cream APPLICATION TO FACE AT BEDTIME    triamcinolone cream 0.1 % APPLY TWICE A DAY AS NEEDED   vitamin C  500 MG tablet Take daily.   Zinc 50 MG CAPS Take daily.    Allergies  Allergen Reactions   Codeine     Past Medical History:  Diagnosis Date   Allergy    DDD (degenerative disc disease), lumbar    Depression    Hyperlipidemia    Hypertension    Osteopenia    Personal history of squamous cell carcinoma of skin 06/03/2019   Raynaud disease 2010    Health Maintenance  Topic Date Due   TETANUS/TDAP  09/28/2021   MAMMOGRAM  05/12/2022  COLONOSCOPY 09/22/2024   Pneumonia Vaccine 2+ Years old  Completed   INFLUENZA VACCINE  Completed   DEXA SCAN  Completed   COVID-19 Vaccine  Completed   Hepatitis C Screening  Completed   Zoster Vaccines- Shingrix  Completed   HPV VACCINES  Aged Out     Immunization History  Administered Date(s) Administered   Fluad Quad(high Dose) 03/14/2021   Influenza  03/04/2019   Influenza 03/14/2018, 02/27/2020   PFIZER SARS-COV-2 Vacc 07/03/2019, 07/25/2019, 03/29/2020, 11/10/2020   Pfizer Covid-19 Vacc Bivalent Booster  03/22/2021   Pneumococcal -13 12/07/2016   Pneumococcal -23 03/04/2019   Pneumococcal-23 02/13/2003   Tdap 09/29/2011   Varicella Zoster Immune Globulin 11/22/2015   Zoster Recombinat (Shingrix) 03/14/2018, 05/20/2018    Last Colon -  09/23/2019- Dr Romilda Garret - recc 5 yr f/u due May 2026   Last MGM -  05/25/2020  Last BMD -  06/01/2020 - Osteopenia   Past Surgical History:  Procedure Laterality Date   ABDOMINAL HYSTERECTOMY     CATARACT  EXTRACTION Right 07/05/2020   Dr. Tommy Rainwater   CATARACT EXTRACTION Left 07/26/2020   Dr. Tommy Rainwater   RHINOPLASTY       Family History  Problem Relation Age of Onset   Diabetes Mother    Hypertension Mother    Arthritis Mother    Stroke Mother        Dec 2016   Arthritis Father        PMR   Cancer Father        colon   Hypertension Father    Macular degeneration Father    Stroke Father        due to TA     Social History   Tobacco Use   Smoking status: Never   Smokeless tobacco: Never  Substance Use Topics   Alcohol use: Yes    Comment: Rare   Drug use: No      ROS Constitutional: Denies fever, chills, weight loss/gain, headaches, insomnia,  night sweats, and change in appetite. Does c/o fatigue. Eyes: Denies redness, blurred vision, diplopia, discharge, itchy, watery eyes.  ENT: Denies discharge, congestion, post nasal drip, epistaxis, sore throat, earache, hearing loss, dental pain, Tinnitus, Vertigo, Sinus pain, snoring.  Cardio: Denies chest pain, palpitations, irregular heartbeat, syncope, dyspnea, diaphoresis, orthopnea, PND, claudication, edema Respiratory: denies cough, dyspnea, DOE, pleurisy, hoarseness, laryngitis, wheezing.  Gastrointestinal: Denies dysphagia, heartburn, reflux, water brash, pain, cramps, nausea, vomiting, bloating, diarrhea, constipation, hematemesis, melena, hematochezia, jaundice, hemorrhoids Genitourinary: Denies dysuria, frequency, urgency, nocturia, hesitancy, discharge, hematuria, flank pain Breast: Breast lumps, nipple discharge, bleeding.  Musculoskeletal: Denies arthralgia, myalgia, stiffness, Jt. Swelling, pain, limp, and strain/sprain. Denies falls. Skin: Denies puritis, rash, hives, warts, acne, eczema, changing in skin lesion Neuro: No weakness, tremor, incoordination, spasms, paresthesia, pain Psychiatric: Denies confusion, memory loss, sensory loss. Denies Depression. Endocrine: Denies change in weight, skin, hair change,  nocturia, and paresthesia, diabetic polys, visual blurring, hyper / hypo glycemic episodes.  Heme/Lymph: No excessive bleeding, bruising, enlarged lymph nodes.  Physical Exam  BP 122/78   Pulse 69   Temp 97.8 F (36.6 C)   Resp 16   Ht 5\' 1"  (1.549 m)   Wt 125 lb 3.2 oz (56.8 kg)   SpO2 99%   BMI 23.66 kg/m   General Appearance: Well nourished, well groomed and in no apparent distress.  Eyes: PERRLA, EOMs, conjunctiva no swelling or erythema, normal fundi and vessels. Sinuses: No frontal/maxillary tenderness ENT/Mouth: EACs patent / TMs  nl.  Nares clear without erythema, swelling, mucoid exudates. Oral hygiene is good. No erythema, swelling, or exudate. Tongue normal, non-obstructing. Tonsils not swollen or erythematous. Hearing normal.  Neck: Supple, thyroid not palpable. No bruits, nodes or JVD. Respiratory: Respiratory effort normal.  BS equal and clear bilateral without rales, rhonci, wheezing or stridor. Cardio: Heart sounds are normal with regular rate and rhythm and no murmurs, rubs or gallops. Peripheral pulses are normal and equal bilaterally without edema. No aortic or femoral bruits. Chest: symmetric with normal excursions and percussion. Breasts: Symmetric, without lumps, nipple discharge, retractions, or fibrocystic changes.  Abdomen: Flat, soft with bowel sounds active. Nontender, no guarding, rebound, hernias, masses, or organomegaly.  Lymphatics: Non tender without lymphadenopathy.  Musculoskeletal: Full ROM all peripheral extremities, joint stability, 5/5 strength, and normal gait. Skin: Warm and dry without rashes, lesions, cyanosis, clubbing or  ecchymosis.  Neuro: Cranial nerves intact, reflexes equal bilaterally. Normal muscle tone, no cerebellar symptoms. Sensation intact.  Pysch: Alert and oriented X 3, normal affect, Insight and Judgment appropriate.    Assessment and Plan  1. Annual Preventative Screening Examination   2. Essential hypertension  - EKG  12-Lead - Urinalysis, Routine w reflex microscopic - Microalbumin / creatinine urine ratio - CBC with Differential/Platelet - COMPLETE METABOLIC PANEL WITH GFR - Magnesium - TSH  3. Hyperlipidemia, mixed  - EKG 12-Lead - Lipid panel  4. Abnormal glucose  - EKG 12-Lead - Hemoglobin A1c - Insulin, random  5. Vitamin D deficiency  - TSH  6. Osteopenia, unspecified location  - EKG 12-Lead  7. Screening for ischemic heart disease  - EKG 12-Lead - CBC with Differential/Platelet - COMPLETE METABOLIC PANEL WITH GFR - Magnesium - Lipid panel - TSH - Hemoglobin A1c - Insulin, random  8. FH: hypertension  - EKG 12-Lead  9. Medication management  - Urinalysis, Routine w reflex microscopic - Microalbumin / creatinine urine ratio  10. Screening for colorectal cancer  - Cologuard            Patient was counseled in prudent diet to achieve/maintain BMI less than 25 for weight control, BP monitoring, regular exercise and medications. Discussed med's effects and SE's. Screening labs and tests as requested with regular follow-up as recommended. Over 40 minutes of exam, counseling, chart review and high complex critical decision making was performed.   Kirtland Bouchard, MD

## 2021-05-11 NOTE — Patient Instructions (Signed)

## 2021-05-12 NOTE — Progress Notes (Signed)
============================================================ -   Test results slightly outside the reference range are not unusual. If there is anything important, I will review this with you,  otherwise it is considered normal test values.  If you have further questions,  please do not hesitate to contact me at the office or via My Chart.  ============================================================ ============================================================  -  Total Chol = 228 is ok or actually [place], since your Good                                             Protective HDL Cholesterol is so super high at 118 ! ( You're genetically blessed  !)  ============================================================ ============================================================  -  A1c - Normal - No Diabetes  - Great  ! ============================================================ ============================================================  -  All Else - CBC - Kidneys - Electrolytes - Liver - Magnesium & Thyroid    - all  Normal / OK ============================================================ ============================================================

## 2021-05-14 LAB — COMPLETE METABOLIC PANEL WITH GFR
AG Ratio: 1.9 (calc) (ref 1.0–2.5)
ALT: 22 U/L (ref 6–29)
AST: 24 U/L (ref 10–35)
Albumin: 4.3 g/dL (ref 3.6–5.1)
Alkaline phosphatase (APISO): 102 U/L (ref 37–153)
BUN: 13 mg/dL (ref 7–25)
CO2: 26 mmol/L (ref 20–32)
Calcium: 9.5 mg/dL (ref 8.6–10.4)
Chloride: 102 mmol/L (ref 98–110)
Creat: 0.96 mg/dL (ref 0.50–1.05)
Globulin: 2.3 g/dL (calc) (ref 1.9–3.7)
Glucose, Bld: 78 mg/dL (ref 65–99)
Potassium: 4.7 mmol/L (ref 3.5–5.3)
Sodium: 138 mmol/L (ref 135–146)
Total Bilirubin: 0.6 mg/dL (ref 0.2–1.2)
Total Protein: 6.6 g/dL (ref 6.1–8.1)
eGFR: 64 mL/min/{1.73_m2} (ref >=60)

## 2021-05-14 LAB — CBC WITH DIFFERENTIAL/PLATELET
Absolute Monocytes: 556 cells/uL (ref 200–950)
Basophils Absolute: 33 cells/uL (ref 0–200)
Basophils Relative: 0.4 %
Eosinophils Absolute: 108 cells/uL (ref 15–500)
Eosinophils Relative: 1.3 %
HCT: 41.9 % (ref 35.0–45.0)
Hemoglobin: 13.5 g/dL (ref 11.7–15.5)
Lymphs Abs: 2606 cells/uL (ref 850–3900)
MCH: 31.1 pg (ref 27.0–33.0)
MCHC: 32.2 g/dL (ref 32.0–36.0)
MCV: 96.5 fL (ref 80.0–100.0)
MPV: 11.1 fL (ref 7.5–12.5)
Monocytes Relative: 6.7 %
Neutro Abs: 4997 cells/uL (ref 1500–7800)
Neutrophils Relative %: 60.2 %
Platelets: 279 10*3/uL (ref 140–400)
RBC: 4.34 10*6/uL (ref 3.80–5.10)
RDW: 12.3 % (ref 11.0–15.0)
Total Lymphocyte: 31.4 %
WBC: 8.3 10*3/uL (ref 3.8–10.8)

## 2021-05-14 LAB — TSH: TSH: 2.15 mIU/L (ref 0.40–4.50)

## 2021-05-14 LAB — URINALYSIS, ROUTINE W REFLEX MICROSCOPIC
Bilirubin Urine: NEGATIVE
Glucose, UA: NEGATIVE
Hgb urine dipstick: NEGATIVE
Ketones, ur: NEGATIVE
Leukocytes,Ua: NEGATIVE
Nitrite: NEGATIVE
Protein, ur: NEGATIVE
Specific Gravity, Urine: 1.002 (ref 1.001–1.035)
pH: 6 (ref 5.0–8.0)

## 2021-05-14 LAB — MICROALBUMIN / CREATININE URINE RATIO
Creatinine, Urine: 10 mg/dL — ABNORMAL LOW (ref 20–275)
Microalb, Ur: <0.2 mg/dL

## 2021-05-14 LAB — MAGNESIUM: Magnesium: 2.2 mg/dL (ref 1.5–2.5)

## 2021-05-14 LAB — LIPID PANEL
Cholesterol: 228 mg/dL — ABNORMAL HIGH (ref <200)
HDL: 118 mg/dL (ref >=50)
LDL Cholesterol (Calc): 93 mg/dL (calc)
Non-HDL Cholesterol (Calc): 110 mg/dL (calc) (ref <130)
Total CHOL/HDL Ratio: 1.9 (calc) (ref <5.0)
Triglycerides: 84 mg/dL (ref <150)

## 2021-05-14 LAB — INSULIN, RANDOM: Insulin: 3.9 u[IU]/mL

## 2021-05-14 LAB — HEMOGLOBIN A1C
Hgb A1c MFr Bld: 5.5 % of total Hgb (ref <5.7)
Mean Plasma Glucose: 111 mg/dL
eAG (mmol/L): 6.2 mmol/L

## 2021-06-06 ENCOUNTER — Ambulatory Visit: Payer: Medicare Other | Admitting: Adult Health Nurse Practitioner

## 2021-06-08 DIAGNOSIS — Z1212 Encounter for screening for malignant neoplasm of rectum: Secondary | ICD-10-CM | POA: Diagnosis not present

## 2021-06-08 DIAGNOSIS — Z1211 Encounter for screening for malignant neoplasm of colon: Secondary | ICD-10-CM | POA: Diagnosis not present

## 2021-06-13 ENCOUNTER — Encounter: Payer: Self-pay | Admitting: Internal Medicine

## 2021-06-13 ENCOUNTER — Other Ambulatory Visit: Payer: Self-pay | Admitting: Internal Medicine

## 2021-06-13 ENCOUNTER — Ambulatory Visit
Admission: RE | Admit: 2021-06-13 | Discharge: 2021-06-13 | Disposition: A | Discharge status: Home / Self Care | Payer: PPO | Source: Ambulatory Visit | Attending: Internal Medicine | Admitting: Internal Medicine

## 2021-06-13 DIAGNOSIS — B351 Tinea unguium: Secondary | ICD-10-CM

## 2021-06-13 DIAGNOSIS — Z1231 Encounter for screening mammogram for malignant neoplasm of breast: Secondary | ICD-10-CM

## 2021-06-13 DIAGNOSIS — R195 Other fecal abnormalities: Secondary | ICD-10-CM

## 2021-06-13 LAB — COLOGUARD: COLOGUARD: POSITIVE — AB

## 2021-07-06 DIAGNOSIS — K648 Other hemorrhoids: Secondary | ICD-10-CM | POA: Diagnosis not present

## 2021-07-06 DIAGNOSIS — R195 Other fecal abnormalities: Secondary | ICD-10-CM | POA: Diagnosis not present

## 2021-08-04 ENCOUNTER — Other Ambulatory Visit: Payer: Self-pay

## 2021-08-04 ENCOUNTER — Ambulatory Visit (INDEPENDENT_AMBULATORY_CARE_PROVIDER_SITE_OTHER): Payer: PPO | Admitting: Nurse Practitioner

## 2021-08-04 ENCOUNTER — Encounter: Payer: Self-pay | Admitting: Nurse Practitioner

## 2021-08-04 VITALS — BP 102/70 | HR 84 | Temp 97.7°F | Wt 121.4 lb

## 2021-08-04 DIAGNOSIS — J302 Other seasonal allergic rhinitis: Secondary | ICD-10-CM | POA: Diagnosis not present

## 2021-08-04 DIAGNOSIS — I10 Essential (primary) hypertension: Secondary | ICD-10-CM | POA: Diagnosis not present

## 2021-08-04 DIAGNOSIS — J0100 Acute maxillary sinusitis, unspecified: Secondary | ICD-10-CM

## 2021-08-04 DIAGNOSIS — R6889 Other general symptoms and signs: Secondary | ICD-10-CM | POA: Diagnosis not present

## 2021-08-04 LAB — POCT INFLUENZA A/B
Influenza A, POC: NEGATIVE
Influenza B, POC: NEGATIVE

## 2021-08-04 MED ORDER — DEXAMETHASONE 1 MG PO TABS: ORAL_TABLET | ORAL | 0 refills | Status: DC

## 2021-08-04 MED ORDER — BENZONATATE 100 MG PO CAPS: 100.0000 mg | ORAL_CAPSULE | Freq: Four times a day (QID) | ORAL | 1 refills | Status: DC | PRN

## 2021-08-04 MED ORDER — AZITHROMYCIN 250 MG PO TABS: ORAL_TABLET | ORAL | 1 refills | Status: DC

## 2021-08-04 NOTE — Progress Notes (Signed)
Assessment and Plan: ?Angles was seen today for acute visit. ? ?Diagnoses and all orders for this visit: ? ?Flu-like symptoms ?-     POCT Influenza A/B ?Negative ? ?Acute maxillary sinusitis, recurrence not specified ?Push fluids , mucinex as needed, rest ?If develops fever or symptoms are not improved in next 5 days notify the office ?-     dexamethasone (DECADRON) 1 MG tablet; Take 3 tabs for 3 days, 2 tabs for 3 days 1 tab for 5 days. Take with food. ?-     azithromycin (ZITHROMAX) 250 MG tablet; Take 2 tablets (500 mg) on  Day 1,  followed by 1 tablet (250 mg) once daily on Days 2 through 5. ?-     benzonatate (TESSALON PERLES) 100 MG capsule; Take 1 capsule (100 mg total) by mouth every 6 (six) hours as needed for cough. ? ?Essential hypertension ?- continue medications, DASH diet, exercise and monitor at home. Call if greater than 130/80.   ? ?Seasonal allergic rhinitis, unspecified trigger ?Continue Zyrtec and Astelin nasal spray ?  ? ? ? ?Further disposition pending results of labs. Discussed med's effects and SE's.   ?Over 20 minutes of exam, counseling, chart review, and critical decision making was performed.  ? ?Future Appointments  ?Date Time Provider [place]  ?08/25/2021  9:30 AM Liane Comber, NP [facility] None  ?11/25/2021 10:30 AM Unk Pinto, MD [facility] None  ?05/18/2022  2:00 PM Unk Pinto, MD [facility] None  ? ? ?------------------------------------------------------------------------------------------------------------------ ? ? ?HPI ?BP 102/70   Pulse 84   Temp 97.7 ?F (36.5 ?C)   Wt 121 lb 6.4 oz (55.1 kg)   SpO2 94%   BMI 22.94 kg/m?  ? ?71 y.o.female presents for respiratory symptoms x 4 days.  Negative covid and flu test. She is having nasal congestion with yellow mucus.  Productive cough of yellow mucus. She has had chills and body aches, headaches. Denies nausea, vomiting, diarrhea and fevers. She has been using Tylenol , Zyrtec and Sudafed.  ?She does have  a history of allergic rhinitis and is currently on Zyrtec and Astelin nasal spray. ? ?Blood pressure is well controlled with amlodipine 2.5 mg daily.  Denies chest pain, shortness of breath, dizziness and blurred vision. ?BP Readings from Last 3 Encounters:  ?08/04/21 102/70  ?05/11/21 122/78  ?01/18/21 128/78  ?  ? ?Past Medical History:  ?Diagnosis Date  ? Allergy   ? DDD (degenerative disc disease), lumbar   ? Depression   ? Hyperlipidemia   ? Hypertension   ? Osteopenia   ? Personal history of squamous cell carcinoma of skin 06/03/2019  ? Raynaud disease 2010  ?  ? ?Allergies  ?Allergen Reactions  ? Codeine   ? ? ?Current Outpatient Medications on File Prior to Visit  ?Medication Sig  ? ALPRAZolam (XANAX) 0.5 MG tablet Take      1/2 - 1 tablet       2 - 3 x /day        ONLY       if needed for Anxiety Attack or Sleep &  limit to 5 days /week to avoid Addiction & Dementia  ? amLODipine (NORVASC) 2.5 MG tablet Take  1 tablet  Daily  for BP  / Patient knows to take by mouth  ? atorvastatin (LIPITOR) 40 MG tablet Take 40 mg by mouth in the evening twice a week for cholesterol.  ? azelastine (ASTELIN) 0.1 % nasal spray Use 1 to 2 sprays each nostril 1  to 2 x /day as Needed  ? BABY ASPIRIN PO Take 81 mg by mouth daily.  ? Calcium Carbonate-Vitamin D (CALCIUM + D PO) Take 600 mg by mouth daily.   ? Cetirizine HCl (ZYRTEC ALLERGY PO) Take by mouth.  ? Cholecalciferol (VITAMIN D PO) Take 4,000 Int'l Units by mouth 2 (two) times daily.   ? CINNAMON PO Take 1,000 mg by mouth daily.  ? estradiol (ESTRACE) 1 MG tablet TAKE 1 TABLET DAILY  ? Multiple Vitamins-Minerals (MULTIVITAMIN PO) Take by mouth. With calcium  ? triamcinolone cream (KENALOG) 0.1 % APPLY TO AFFECTED AREA TWICE A DAY AS NEEDED  ? vitamin C (ASCORBIC ACID) 500 MG tablet Take 500 mg by mouth daily.  ? Zinc 50 MG CAPS Take by mouth daily.  ? BIOTIN PO Take 10,000 mg by mouth daily. (Patient not taking: Reported on 08/04/2021)  ? ?No current facility-administered  medications on file prior to visit.  ? ? ?ROS: all negative except above.  ? ?Physical Exam: ? ?BP 102/70   Pulse 84   Temp 97.7 ?F (36.5 ?C)   Wt 121 lb 6.4 oz (55.1 kg)   SpO2 94%   BMI 22.94 kg/m?  ? ?General Appearance: Well nourished, in no apparent distress. ?Eyes: PERRLA, EOMs, conjunctiva no swelling or erythema ?Sinuses: Positive maxillary tenderness ?ENT/Mouth: Ext aud canals clear, TMs dull without erythema. No erythema, swelling, or exudate on post pharynx.  Tonsils not swollen or erythematous. Hearing normal.  ?Neck: Supple, thyroid normal.  ?Respiratory: Respiratory effort normal, BS equal bilaterally without rales, rhonchi, wheezing or stridor.  ?Cardio: RRR with no MRGs. Brisk peripheral pulses without edema.  ?Abdomen: Soft, + BS.  Non tender, no guarding, rebound, hernias, masses. ?Lymphatics: Positive cervical adenopathy  ?Musculoskeletal: Full ROM, 5/5 strength, normal gait.  ?Skin: Warm, dry without rashes, lesions, ecchymosis.  ?Neuro: Cranial nerves intact. Normal muscle tone, no cerebellar symptoms. Sensation intact.  ?Psych: Awake and oriented X 3, normal affect, Insight and Judgment appropriate.  ?  ? ?Magda Bernheim, NP ?2:49 PM ?[facility] ? ?

## 2021-08-15 ENCOUNTER — Ambulatory Visit: Payer: PPO | Admitting: Adult Health

## 2021-08-25 ENCOUNTER — Other Ambulatory Visit: Payer: Self-pay

## 2021-08-25 ENCOUNTER — Ambulatory Visit (INDEPENDENT_AMBULATORY_CARE_PROVIDER_SITE_OTHER): Payer: PPO | Admitting: Adult Health

## 2021-08-25 ENCOUNTER — Encounter: Payer: Self-pay | Admitting: Adult Health

## 2021-08-25 VITALS — BP 136/80 | HR 70 | Temp 97.7°F | Wt 125.0 lb

## 2021-08-25 DIAGNOSIS — M8580 Other specified disorders of bone density and structure, unspecified site: Secondary | ICD-10-CM

## 2021-08-25 DIAGNOSIS — J0110 Acute frontal sinusitis, unspecified: Secondary | ICD-10-CM

## 2021-08-25 DIAGNOSIS — F419 Anxiety disorder, unspecified: Secondary | ICD-10-CM

## 2021-08-25 DIAGNOSIS — Z79899 Other long term (current) drug therapy: Secondary | ICD-10-CM | POA: Diagnosis not present

## 2021-08-25 DIAGNOSIS — N393 Stress incontinence (female) (male): Secondary | ICD-10-CM

## 2021-08-25 DIAGNOSIS — Z85828 Personal history of other malignant neoplasm of skin: Secondary | ICD-10-CM | POA: Diagnosis not present

## 2021-08-25 DIAGNOSIS — Z0001 Encounter for general adult medical examination with abnormal findings: Secondary | ICD-10-CM

## 2021-08-25 DIAGNOSIS — R6889 Other general symptoms and signs: Secondary | ICD-10-CM | POA: Diagnosis not present

## 2021-08-25 DIAGNOSIS — R195 Other fecal abnormalities: Secondary | ICD-10-CM

## 2021-08-25 DIAGNOSIS — F325 Major depressive disorder, single episode, in full remission: Secondary | ICD-10-CM | POA: Diagnosis not present

## 2021-08-25 DIAGNOSIS — R7309 Other abnormal glucose: Secondary | ICD-10-CM

## 2021-08-25 DIAGNOSIS — E782 Mixed hyperlipidemia: Secondary | ICD-10-CM

## 2021-08-25 DIAGNOSIS — I7300 Raynaud's syndrome without gangrene: Secondary | ICD-10-CM | POA: Diagnosis not present

## 2021-08-25 DIAGNOSIS — I10 Essential (primary) hypertension: Secondary | ICD-10-CM

## 2021-08-25 DIAGNOSIS — Z0000 Encounter for general adult medical examination without abnormal findings: Secondary | ICD-10-CM

## 2021-08-25 MED ORDER — DEXAMETHASONE 1 MG PO TABS: ORAL_TABLET | ORAL | 0 refills | Status: DC

## 2021-08-25 MED ORDER — DOXYCYCLINE HYCLATE 100 MG PO CAPS: ORAL_CAPSULE | ORAL | 0 refills | Status: DC

## 2021-08-25 NOTE — Progress Notes (Signed)
?MEDICARE ANNUAL WELLNESS VISIT ? ?Assessment:  ? ?Annual Medicare Wellness Visit ?Due annually  ?Health maintenance reviewed ? ?Essential hypertension ?Continue current medication ?Monitor blood pressure at home; call if consistently over 130/80 ?Continue DASH diet.   ?Reminder to go to the ER if any CP, SOB, nausea, dizziness, severe HA, changes vision/speech, left arm numbness and tingling and jaw pain. ? ?Raynaud's disease without gangrene ?Continue norvasc, stay warm ?Avoid triggers ? ?Osteopenia, unspecified location ?Osteopenia- get dexa after 05/2022, continue Vit D and Ca, weight bearing exercises ?Suggested K2 supplement  ? ?DDD (degenerative disc disease), lumbar ?Doing well at this time ?monitor ? ?Hyperlipidemia, unspecified hyperlipidemia type ?At goal with current medications;  ?Continue Atorvastatin 40 mg twice weekly; myalgias with higher frequency ?LDL goal <100; monitor  ?-check lipids q32m decrease fatty foods, increase activity.  ? ?Dep[place], [place] ?- remains in remission off of medications; continue to monitor closely ?- stress management techniques discussed, increase water, good sleep hygiene discussed, increase exercise, and increase veggies.  ? ?Allergic state, subsequent encounter ?- Allegra OTC, increase H20, allergy hygiene explained. ? ?Stress incontinence ?Monitor  ? ?Other abnormal glucose ?Recent A1Cs at goal ?Discussed diet/exercise, weight management  ?Defer A1C; monitor serum glucose and weight trends ? ?Vitamin D deficiency ?Continue supplement ? ?Personal history of squamous cell carcinoma ?R chest removed 01/2020; continue derm follow up as recommended ? ?Anxiety ?Well managed by current regimen; continue medications; continue to limit benzo ?Stress management techniques discussed, increase water, good sleep hygiene discussed, increase exercise, and increase veggies.  ? ?BMI 22 ?Discussed dietary and exercise modifications ? ?Medication  Management ?Continued ? ?[facility] ?[facility]2021; [facility] ? ?Acute frontal sinusitis ?Recent zpak, hx of GI upset with augmentin, will try doxycycline with sudafed and saline irrigations, flonase but if not imprvoing in 3 days contact office and consider fluoroquinolone.  ? ? ?Orders Placed This Encounter  ?Procedures  ? DG Bone Density  ? ?Defer labs today after review of last and patient prefernece ? ?Over 30 minutes of face to face interview, exam, counseling, chart review and critical decision making was performed. ? ?Future Appointments  ?Date Time Provider [place] ?11/25/2021 10:30 AM MUnk Pinto MD [facility] None  ?05/18/2022  2:00 PM MUnk Pinto MD [facility] None  ?08/29/2022  9:00 AM CLiane Comber NP [facility] None  ? ?Plan:  ? ?During the course of the visit the patient was educated and counseled about appropriate screening and preventive services including:  ? ?Pneumococcal vaccine  ?Prevnar 13 ?Influenza vaccine ?Td vaccine ?Screening electrocardiogram ?Bone densitometry screening ?Colorectal cancer screening ?Diabetes screening ?Glaucoma screening ?Nutrition counseling  ?Advanced directives: requested ? ? ?Subjective:  ?KMICHEL ESKELSONis a 71y.o. female who presents for Medicare Annual Wellness Visit and follow up. She has Hyperlipidemia, mixed; Essential hypertension; Depression[place]; Allergy; Osteopenia; Raynaud disease; DDD (degenerative disc disease), lumbar; Stress incontinence; Abnormal glucose; Medication management; Personal history of squamous cell carcinoma of skin; Anxiety; and Positive colorectal cancer screening using Cologuard test on their problem list. ? ?She retired in June 2020, dCopywriter, advertising deferring travel plans. ? ?She reports URI that started early March 2023; she was seen early in course and prescribe zpak, dexamethasone, cough meds. Initially with rhinitis, persistent cough. She  reports did feel improve after completing abx for 2-3 days, but reports sx restarted, has had very thick mucoid discharge with sinus pressure, sinus headache, chills. She is taking zyrtec, restarted  sudafed yesterday with some benefit.  ? ?She is on estradiol 1 mg (has reduced to every other day), s/p hysterectomy in 1996, on bASA. She is receptive to reducing further after discussion.  ? ?Follows with derm/ Dr. Ledell Peoples office annually; recently had squamous cell removed from R chest in 2021 and doing well.  ? ?She had + cologuard screening 06/2021 and was referred to Dr. Paulita Fujita; hx of technically difficult colonoscopies, reports has upcoming scheduled 09/27/2021 with Dr. Cristina Gong.  ? ?BMI is Body mass index is 23.62 kg/m?., she has been working on diet and exercise, runs 40-50 min three days a week.  ?Wt Readings from Last 3 Encounters:  ?08/25/21 125 lb (56.7 kg)  ?08/04/21 121 lb 6.4 oz (55.1 kg)  ?05/11/21 125 lb 3.2 oz (56.8 kg)  ? ?Her blood pressure has been controlled at home, today their BP is BP: 136/80 ? She does workout. She denies chest pain, shortness of breath, dizziness. ? ? She is on cholesterol medication (on atorvastatin 40 mg twice weekly). Her LDL cholesterol is at goal of LDL <100. The cholesterol last visit was:   ?Lab Results  ?Component Value Date  ? CHOL 228 (H) 05/11/2021  ? HDL 118 05/11/2021  ? [place] 93 05/11/2021  ? TRIG 84 05/11/2021  ? CHOLHDL 1.9 05/11/2021  ? ? She has been working on diet and exercise for glucose management, and denies paresthesia of the feet, polydipsia, polyuria and visual disturbances. Last A1C in the office was:  ?Lab Results  ?Component Value Date  ? HGBA1C 5.5 05/11/2021  ? ?Patient is on Vitamin D supplement.   ?Lab Results  ?Component Value Date  ? VD25OH 81 11/16/2020  ?   ?Lab Results  ?Component Value Date  ? EGFR 64 05/11/2021  ? EGFR 77 01/18/2021  ? ? ? ? ?Medication Review: ?Current Outpatient Medications on File Prior to Visit  ?Medication Sig  Dispense Refill  ? ALPRAZolam (XANAX) 0.5 MG tablet Take      1/2 - 1 tablet       2 - 3 x /day        ONLY       if needed for Anxiety Attack or Sleep &  limit to 5 days /week to avoid Addiction & Dementia 90 tablet 0  ? amLODipine (NORVASC) 2.5 MG tablet Take  1 tablet  Daily  for BP  / Patient knows to take by mouth 90 tablet 3  ? atorvastatin (LIPITOR) 40 MG tablet Take 40 mg by mouth in the evening twice a week for cholesterol. 30 tablet 3  ? azelastine (ASTELIN) 0.1 % nasal spray Use 1 to 2 sprays each nostril 1 to 2 x /day as Needed 90 mL 3  ? BABY ASPIRIN PO Take 81 mg by mouth daily.    ? benzonatate (TESSALON PERLES) 100 MG capsule Take 1 capsule (100 mg total) by mouth every 6 (six) hours as needed for cough. 30 capsule 1  ? Calcium Carbonate-Vitamin D (CALCIUM + D PO) Take 600 mg by mouth daily.     ? Cetirizine HCl (ZYRTEC ALLERGY PO) Take by mouth.    ? Cholecalciferol (VITAMIN D PO) Take 4,000 Int'l Units by mouth 2 (two) times daily.     ? CINNAMON PO Take 1,000 mg by mouth daily.    ? estradiol (ESTRACE) 1 MG tablet TAKE 1 TABLET DAILY 90 tablet 3  ? Multiple Vitamins-Minerals (MULTIVITAMIN PO) Take by mouth. With calcium    ? triamcinolone  cream (KENALOG) 0.1 % APPLY TO AFFECTED AREA TWICE A DAY AS NEEDED  2  ? vitamin C (ASCORBIC ACID) 500 MG tablet Take 500 mg by mouth daily.    ? Zinc 50 MG CAPS Take by mouth daily.    ? ?No current facility-administered medications on file prior to visit.  ? ? ?Allergies  ?Allergen Reactions  ? Codeine   ? ? ?Current Problems (verified) ?Patient Active Problem List  ? Diagnosis Date Noted  ? Positive colorectal cancer screening using Cologuard test 08/25/2021  ? Anxiety 08/02/2020  ? Personal history of squamous cell carcinoma of skin 06/03/2019  ? Medication management 01/04/2017  ? Abnormal glucose 11/22/2015  ? Stress incontinence 11/13/2014  ? Hyperlipidemia, mixed   ? Essential hypertension   ? Depression, major, in remission ([place])   ? Allergy   ?  Osteopenia   ? Raynaud disease   ? DDD (degenerative disc disease), lumbar   ? ? ?Screening Tests ?Immunization History  ?Administered Date(s) Administered  ? Fluad Quad(high Dose 65+) 03/14/2021  ? Influenza Inj Mdck Quad Pf 09/29/

## 2021-09-27 DIAGNOSIS — K635 Polyp of colon: Secondary | ICD-10-CM | POA: Diagnosis not present

## 2021-09-27 DIAGNOSIS — Z8371 Family history of colonic polyps: Secondary | ICD-10-CM | POA: Diagnosis not present

## 2021-09-27 DIAGNOSIS — R195 Other fecal abnormalities: Secondary | ICD-10-CM | POA: Diagnosis not present

## 2021-09-29 DIAGNOSIS — K635 Polyp of colon: Secondary | ICD-10-CM | POA: Diagnosis not present

## 2021-11-15 DIAGNOSIS — L820 Inflamed seborrheic keratosis: Secondary | ICD-10-CM | POA: Diagnosis not present

## 2021-11-15 DIAGNOSIS — L814 Other melanin hyperpigmentation: Secondary | ICD-10-CM | POA: Diagnosis not present

## 2021-11-15 DIAGNOSIS — L910 Hypertrophic scar: Secondary | ICD-10-CM | POA: Diagnosis not present

## 2021-11-15 DIAGNOSIS — Z08 Encounter for follow-up examination after completed treatment for malignant neoplasm: Secondary | ICD-10-CM | POA: Diagnosis not present

## 2021-11-15 DIAGNOSIS — Z85828 Personal history of other malignant neoplasm of skin: Secondary | ICD-10-CM | POA: Diagnosis not present

## 2021-11-15 DIAGNOSIS — L538 Other specified erythematous conditions: Secondary | ICD-10-CM | POA: Diagnosis not present

## 2021-11-15 DIAGNOSIS — L570 Actinic keratosis: Secondary | ICD-10-CM | POA: Diagnosis not present

## 2021-11-25 ENCOUNTER — Ambulatory Visit: Payer: PPO | Admitting: Internal Medicine

## 2021-12-07 ENCOUNTER — Encounter: Payer: Self-pay | Admitting: Internal Medicine

## 2021-12-07 NOTE — Patient Instructions (Signed)

## 2021-12-07 NOTE — Progress Notes (Unsigned)
Future Appointments  Date Time Provider Department  12/08/2021             6 mo OV   9:30 AM Unk Pinto, MD [facility]  05/18/2022         CPE  2:00 PM Unk Pinto, MD [facility]  08/29/2022          Wellness  9:00 AM Alycia Rossetti, NP [facility]    History of Present Illness:       This very nice 71 y.o. MWF presents for 6  month follow up with HTN, HLD, Pre-Diabetes and Vitamin D Deficiency.        Patient is followed for HTN since 2001 initiating treatment in 2009.  BP has been controlled and today's BP is  110/68 . Patient denies complaints of any cardiac type chest pain, palpitations, dyspnea Vertell Limber /PND, dizziness, claudication, or dependent edema.         Hyperlipidemia is controlled with diet & Atorvastatin . Patient denies myalgias or other med SE's. Last Lipids were at goal with a LDL 93 & an extremely high HDL level of 118 !  Lab Results  Component Value Date   CHOL 228 (H) 05/11/2021   HDL 118 05/11/2021   LDLCALC 93 05/11/2021   TRIG 84 05/11/2021   CHOLHDL 1.9 05/11/2021     Also, the patient has history of PreDiabetes (A1c 5.7%  /2015 & 5.9% /2016) and has had no symptoms of reactive hypoglycemia, diabetic polys, paresthesias or visual blurring.  Last A1c was normal & at goal :  Lab Results  Component Value Date   HGBA1C 5.5 05/11/2021                                                         Further, the patient also has history of Vitamin D Deficiency ("27" /2009) and supplements vitamin D without any suspected side-effects. Last vitamin D was at goal :  Lab Results  Component Value Date   VD25OH 81 11/16/2020       Current Outpatient Medications:    ALPRAZolam (XANAX) 0.5 MG tablet, Take      1/2 - 1 tablet       2 - 3 x /day        ONLY       if needed for Anxiety Attack or Sleep &  limit to 5 days /week to avoid Addiction & Dementia, Disp: 90 tablet, Rfl: 0   amLODipine (NORVASC) 2.5 MG tablet, Take  1 tablet  Daily  for BP  /  Patient knows to take by mouth, Disp: 90 tablet, Rfl: 3   atorvastatin (LIPITOR) 40 MG tablet, Take 40 mg by mouth in the evening twice a week for cholesterol., Disp: 30 tablet, Rfl: 3   azelastine (ASTELIN) 0.1 % nasal spray, Use 1 to 2 sprays each nostril 1 to 2 x /day as Needed, Disp: 90 mL, Rfl: 3   BABY ASPIRIN PO, Take 81 mg by mouth daily., Disp: , Rfl:    Calcium Carbonate-Vitamin D (CALCIUM + D PO), Take 600 mg by mouth daily. , Disp: , Rfl:    Cetirizine HCl (ZYRTEC ALLERGY PO), Take by mouth., Disp: , Rfl:    Cholecalciferol (VITAMIN D PO), Take 4,000 Int'l Units by mouth 2 (two) times daily. ,  Disp: , Rfl:    CINNAMON PO, Take 1,000 mg by mouth daily., Disp: , Rfl:    estradiol (ESTRACE) 1 MG tablet, TAKE 1 TABLET DAILY, Disp: 90 tablet, Rfl: 3   Multiple Vitamins-Minerals (MULTIVITAMIN PO), Take by mouth. With calcium, Disp: , Rfl:    triamcinolone cream (KENALOG) 0.1 %, APPLY TO AFFECTED AREA TWICE A DAY AS NEEDED, Disp: , Rfl: 2   vitamin C (ASCORBIC ACID) 500 MG tablet, Take 500 mg by mouth daily., Disp: , Rfl:    Zinc 50 MG CAPS, Take by mouth daily., Disp: , Rfl:     Allergies  Allergen Reactions   Codeine     PMHx:   Past Medical History:  Diagnosis Date   Allergy    DDD (degenerative disc disease), lumbar    Depression    Hyperlipidemia    Hypertension    Osteopenia    Personal history of squamous cell carcinoma of skin 06/03/2019   Raynaud disease 2010     Immunization History  Administered Date(s) Administered   Influenza Inj Mdck Quad  03/04/2019   Influenza- 03/14/2018, 02/27/2020   PFIZER  SARS-COV-2 Vaccination 07/03/2019, 07/25/2019, 03/29/2020, 11/10/2020   Pneumococcal -13 12/07/2016   Pneumococcal -23 03/04/2019   Pneumococcal- 23 02/13/2003   Tdap 09/29/2011   Varicella Zoster Immune Globulin 11/22/2015   Zoster Recombinat (Shingrix) 03/14/2018, 05/20/2018     Past Surgical History:  Procedure Laterality Date   ABDOMINAL HYSTERECTOMY      CATARACT EXTRACTION Right 07/05/2020   Dr. Tommy Rainwater   CATARACT EXTRACTION Left 07/26/2020   Dr. Tommy Rainwater   RHINOPLASTY      FHx:    Reviewed / unchanged  SHx:    Reviewed / unchanged   Systems Review:  Constitutional: Denies fever, chills, wt changes, headaches, insomnia, fatigue, night sweats, change in appetite. Eyes: Denies redness, blurred vision, diplopia, discharge, itchy, watery eyes.  ENT: Denies discharge, congestion, post nasal drip, epistaxis, sore throat, earache, hearing loss, dental pain, tinnitus, vertigo, sinus pain, snoring.  CV: Denies chest pain, palpitations, irregular heartbeat, syncope, dyspnea, diaphoresis, orthopnea, PND, claudication or edema. Respiratory: denies cough, dyspnea, DOE, pleurisy, hoarseness, laryngitis, wheezing.  Gastrointestinal: Denies dysphagia, odynophagia, heartburn, reflux, water brash, abdominal pain or cramps, nausea, vomiting, bloating, diarrhea, constipation, hematemesis, melena, hematochezia  or hemorrhoids. Genitourinary: Denies dysuria, frequency, urgency, nocturia, hesitancy, discharge, hematuria or flank pain. Musculoskeletal: Denies arthralgias, myalgias, stiffness, jt. swelling, pain, limping or strain/sprain.  Skin: Denies pruritus, rash, hives, warts, acne, eczema or change in skin lesion(s). Neuro: No weakness, tremor, incoordination, spasms, paresthesia or pain. Psychiatric: Denies confusion, memory loss or sensory loss. Endo: Denies change in weight, skin or hair change.  Heme/Lymph: No excessive bleeding, bruising or enlarged lymph nodes.  Physical Exam  BP 110/68   Pulse 67   Temp 97.9 F (36.6 C)   Resp 16   Ht '5\' 1"'$  (1.549 m)   Wt 127 lb 6.4 oz (57.8 kg)   SpO2 99%   BMI 24.07 kg/m   Appears  well nourished, well groomed  and in no distress.  Eyes: PERRLA, EOMs, conjunctiva no swelling or erythema. Sinuses: No frontal/maxillary tenderness ENT/Mouth: EAC's clear, TM's nl w/o erythema, bulging. Nares clear w/o  erythema, swelling, exudates. Oropharynx clear without erythema or exudates. Oral hygiene is good. Tongue normal, non obstructing. Hearing intact.  Neck: Supple. Thyroid not palpable. Car 2+/2+ without bruits, nodes or JVD. Chest: Respirations nl with BS clear & equal w/o rales, rhonchi, wheezing or stridor.  Cor:  Heart sounds normal w/ regular rate and rhythm without sig. murmurs, gallops, clicks or rubs. Peripheral pulses normal and equal  without edema.  Abdomen: Soft & bowel sounds normal. Non-tender w/o guarding, rebound, hernias, masses or organomegaly.  Lymphatics: Unremarkable.  Musculoskeletal: Full ROM all peripheral extremities, joint stability, 5/5 strength and normal gait.  Skin: Warm, dry without exposed rashes, lesions or ecchymosis apparent.  Neuro: Cranial nerves intact, reflexes equal bilaterally. Sensory-motor testing grossly intact. Tendon reflexes grossly intact.  Pysch: Alert & oriented x 3.  Insight and judgement nl & appropriate. No ideations.  Assessment and Plan:  1. Essential hypertension  - Continue medication, monitor blood pressure at home.  - Continue DASH diet.  Reminder to go to the ER if any CP,  SOB, nausea, dizziness, severe HA, changes vision/speech. - CBC with Differential/Platelet - COMPLETE METABOLIC PANEL WITH GFR - Magnesium - TSH  2. Hyperlipidemia, mixed  - Continue diet/meds, exercise,& lifestyle modifications.  - Continue monitor periodic cholesterol/liver & renal functions  - Lipid panel - TSH  3. Abnormal glucose  - Continue diet, exercise  - Lifestyle modifications.  - Monitor appropriate labs - Hemoglobin A1c - Insulin, random  4. Vitamin D deficiency  - Continue supplementation  - VITAMIN D 25 Hydroxy  5. Medication management  - CBC with Differential/Platelet - COMPLETE METABOLIC PANEL WITH GFR - Magnesium - Lipid panel - TSH - Hemoglobin A1c - Insulin, random - VITAMIN D 25 Hydroxy           Discussed   regular exercise, BP monitoring, weight control to achieve/maintain BMI less than 25 and discussed med and SE's. Recommended labs to assess and monitor clinical status with further disposition pending results of labs.  I discussed the assessment and treatment plan with the patient. The patient was provided an opportunity to ask questions and all were answered. The patient agreed with the plan and demonstrated an understanding of the instructions.  I provided over 30 minutes of exam, counseling, chart review and  complex critical decision making.        The patient was advised to call back or seek an in-person evaluation if the symptoms worsen or if the condition fails to improve as anticipated.   Kirtland Bouchard, MD

## 2021-12-08 ENCOUNTER — Encounter: Payer: Self-pay | Admitting: Internal Medicine

## 2021-12-08 ENCOUNTER — Ambulatory Visit (INDEPENDENT_AMBULATORY_CARE_PROVIDER_SITE_OTHER): Payer: PPO | Admitting: Internal Medicine

## 2021-12-08 VITALS — BP 110/68 | HR 67 | Temp 97.9°F | Resp 16 | Ht 61.0 in | Wt 127.4 lb

## 2021-12-08 DIAGNOSIS — I10 Essential (primary) hypertension: Secondary | ICD-10-CM

## 2021-12-08 DIAGNOSIS — E559 Vitamin D deficiency, unspecified: Secondary | ICD-10-CM | POA: Diagnosis not present

## 2021-12-08 DIAGNOSIS — E782 Mixed hyperlipidemia: Secondary | ICD-10-CM | POA: Diagnosis not present

## 2021-12-08 DIAGNOSIS — Z79899 Other long term (current) drug therapy: Secondary | ICD-10-CM

## 2021-12-08 DIAGNOSIS — R7309 Other abnormal glucose: Secondary | ICD-10-CM | POA: Diagnosis not present

## 2021-12-08 MED ORDER — FERROUS SULFATE 325 (65 FE) MG PO TABS: ORAL_TABLET | ORAL | 0 refills | Status: AC

## 2021-12-09 ENCOUNTER — Other Ambulatory Visit: Payer: Self-pay | Admitting: Internal Medicine

## 2021-12-09 DIAGNOSIS — M8580 Other specified disorders of bone density and structure, unspecified site: Secondary | ICD-10-CM

## 2021-12-09 LAB — COMPLETE METABOLIC PANEL WITH GFR
AG Ratio: 1.8 (calc) (ref 1.0–2.5)
ALT: 24 U/L (ref 6–29)
AST: 27 U/L (ref 10–35)
Albumin: 4.4 g/dL (ref 3.6–5.1)
Alkaline phosphatase (APISO): 146 U/L (ref 37–153)
BUN: 17 mg/dL (ref 7–25)
CO2: 30 mmol/L (ref 20–32)
Calcium: 10 mg/dL (ref 8.6–10.4)
Chloride: 107 mmol/L (ref 98–110)
Creat: 0.93 mg/dL (ref 0.60–1.00)
Globulin: 2.4 g/dL (calc) (ref 1.9–3.7)
Glucose, Bld: 94 mg/dL (ref 65–99)
Potassium: 5.4 mmol/L — ABNORMAL HIGH (ref 3.5–5.3)
Sodium: 143 mmol/L (ref 135–146)
Total Bilirubin: 0.5 mg/dL (ref 0.2–1.2)
Total Protein: 6.8 g/dL (ref 6.1–8.1)
eGFR: 66 mL/min/{1.73_m2} (ref >=60)

## 2021-12-09 LAB — CBC WITH DIFFERENTIAL/PLATELET
Absolute Monocytes: 525 cells/uL (ref 200–950)
Basophils Absolute: 38 cells/uL (ref 0–200)
Basophils Relative: 0.6 %
Eosinophils Absolute: 192 cells/uL (ref 15–500)
Eosinophils Relative: 3 %
HCT: 42.3 % (ref 35.0–45.0)
Hemoglobin: 13.9 g/dL (ref 11.7–15.5)
Lymphs Abs: 2765 cells/uL (ref 850–3900)
MCH: 31.7 pg (ref 27.0–33.0)
MCHC: 32.9 g/dL (ref 32.0–36.0)
MCV: 96.4 fL (ref 80.0–100.0)
MPV: 10.8 fL (ref 7.5–12.5)
Monocytes Relative: 8.2 %
Neutro Abs: 2880 cells/uL (ref 1500–7800)
Neutrophils Relative %: 45 %
Platelets: 254 10*3/uL (ref 140–400)
RBC: 4.39 10*6/uL (ref 3.80–5.10)
RDW: 11.9 % (ref 11.0–15.0)
Total Lymphocyte: 43.2 %
WBC: 6.4 10*3/uL (ref 3.8–10.8)

## 2021-12-09 LAB — TSH: TSH: 2 mIU/L (ref 0.40–4.50)

## 2021-12-09 LAB — MAGNESIUM: Magnesium: 2.4 mg/dL (ref 1.5–2.5)

## 2021-12-09 LAB — LIPID PANEL
Cholesterol: 222 mg/dL — ABNORMAL HIGH (ref <200)
HDL: 108 mg/dL (ref >=50)
LDL Cholesterol (Calc): 98 mg/dL (calc)
Non-HDL Cholesterol (Calc): 114 mg/dL (calc) (ref <130)
Total CHOL/HDL Ratio: 2.1 (calc) (ref <5.0)
Triglycerides: 74 mg/dL (ref <150)

## 2021-12-09 LAB — HEMOGLOBIN A1C
Hgb A1c MFr Bld: 5.4 % of total Hgb (ref <5.7)
Mean Plasma Glucose: 108 mg/dL
eAG (mmol/L): 6 mmol/L

## 2021-12-09 LAB — VITAMIN D 25 HYDROXY (VIT D DEFICIENCY, FRACTURES): Vit D, 25-Hydroxy: 56 ng/mL (ref 30–100)

## 2021-12-09 LAB — INSULIN, RANDOM: Insulin: 10.7 u[IU]/mL

## 2021-12-09 NOTE — Progress Notes (Signed)
<><><><><><><><><><><><><><><><><><><><><><><><><><><><><><><><><> <><><><><><><><><><><><><><><><><><><><><><><><><><><><><><><><><> -   Test results slightly outside the reference range are not unusual. If there is anything important, I will review this with you,  otherwise it is considered normal test values.  If you have further questions,  please do not hesitate to contact me at the office or via My Chart.  <><><><><><><><><><><><><><><><><><><><><><><><><><><><><><><><><> <><><><><><><><><><><><><><><><><><><><><><><><><><><><><><><><><>  -  Total Chol = 222 is OK, since have such a "Super  High"                                                   - good HDL Cholesterol level of 108  ( goal is over 50  !)   - A there bad LDL Chol = 98 meets criteria less than 100.  <><><><><><><><><><><><><><><><><><><><><><><><><><><><><><><><><>  -  A1c  - Normal  No Diabetes  - Great !  <><><><><><><><><><><><><><><><><><><><><><><><><><><><><><><><><>  -  Vitamin D = 56 - Great !  <><><><><><><><><><><><><><><><><><><><><><><><><><><><><><><><><>  -  All Else - CBC - Kidneys - Electrolytes - Liver - Magnesium & Thyroid    - all  Normal / OK <><><><><><><><><><><><><><><><><><><><><><><><><><><><><><><><><> <><><><><><><><><><><><><><><><><><><><><><><><><><><><><><><><><>  -  Keep up the Great Work  !  <><><><><><><><><><><><><><><><><><><><><><><><><><><><><><><><><> <><><><><><><><><><><><><><><><><><><><><><><><><><><><><><><><><>

## 2021-12-27 DIAGNOSIS — L910 Hypertrophic scar: Secondary | ICD-10-CM | POA: Diagnosis not present

## 2021-12-27 DIAGNOSIS — L309 Dermatitis, unspecified: Secondary | ICD-10-CM | POA: Diagnosis not present

## 2021-12-27 DIAGNOSIS — L570 Actinic keratosis: Secondary | ICD-10-CM | POA: Diagnosis not present

## 2022-01-06 ENCOUNTER — Other Ambulatory Visit: Payer: Self-pay | Admitting: Internal Medicine

## 2022-01-06 MED ORDER — AMLODIPINE BESYLATE 2.5 MG PO TABS: ORAL_TABLET | ORAL | 3 refills | Status: DC

## 2022-01-09 ENCOUNTER — Ambulatory Visit (INDEPENDENT_AMBULATORY_CARE_PROVIDER_SITE_OTHER): Payer: PPO | Admitting: Nurse Practitioner

## 2022-01-09 ENCOUNTER — Encounter: Payer: Self-pay | Admitting: Nurse Practitioner

## 2022-01-09 VITALS — BP 110/74 | HR 62 | Temp 97.2°F | Ht 61.0 in | Wt 125.8 lb

## 2022-01-09 DIAGNOSIS — R300 Dysuria: Secondary | ICD-10-CM | POA: Diagnosis not present

## 2022-01-09 DIAGNOSIS — I10 Essential (primary) hypertension: Secondary | ICD-10-CM | POA: Diagnosis not present

## 2022-01-09 MED ORDER — PHENAZOPYRIDINE HCL 200 MG PO TABS: 200.0000 mg | ORAL_TABLET | Freq: Three times a day (TID) | ORAL | 0 refills | Status: DC | PRN

## 2022-01-09 MED ORDER — NITROFURANTOIN MONOHYD MACRO 100 MG PO CAPS: 100.0000 mg | ORAL_CAPSULE | Freq: Two times a day (BID) | ORAL | 0 refills | Status: AC

## 2022-01-09 NOTE — Progress Notes (Signed)
Assessment and Plan:  There are no diagnoses linked to this encounter.  Alicia Gates was seen today for urinary tract infection.  Diagnoses and all orders for this visit:  Dysuria Push fluids- increase water intake If develop fever, nuasea/vomiting/ abdominal pain go to the ER -     Urinalysis, Routine w reflex microscopic -     Urine Culture -     nitrofurantoin, macrocrystal-monohydrate, (MACROBID) 100 MG capsule; Take 1 capsule (100 mg total) by mouth 2 (two) times daily for 7 days. -     phenazopyridine (PYRIDIUM) 200 MG tablet; Take 1 tablet (200 mg total) by mouth 3 (three) times daily as needed for pain.  Essential hypertension - continue medications, DASH diet, exercise and monitor at home. Call if greater than 130/80.      Further disposition pending results of labs. Discussed med's effects and SE's.   Over 30 minutes of exam, counseling, chart review, and critical decision making was performed.   Future Appointments  Date Time Provider [place]  05/18/2022  2:00 PM Unk Pinto, MD [facility] None  08/29/2022  9:00 AM Alycia Rossetti, NP [facility] None    ------------------------------------------------------------------------------------------------------------------   HPI BP 110/74   Temp (!) 97.2 F (36.2 C)   Ht '5\' 1"'$  (1.549 m)   Wt 125 lb 12.8 oz (57.1 kg)   BMI 23.77 kg/m   71 y.o.female presents for urinary frequency, pain on urination, hematuria, has been having some right and left back pain. Denies fevers and abdominal pain, nausea and vomiting. Took Bactrim DS x 3 days. Symptoms have not improved  BP is currently well controlled with amlodipine 2.5 mg .  Denies chest pain, shortness of breath, dizziness and headaches BP Readings from Last 3 Encounters:  01/09/22 110/74  12/08/21 110/68  08/25/21 136/80      Past Medical History:  Diagnosis Date   Allergy    DDD (degenerative disc disease), lumbar    Depression    Hyperlipidemia     Hypertension    Osteopenia    Personal history of squamous cell carcinoma of skin 06/03/2019   Raynaud disease 2010     Allergies  Allergen Reactions   Codeine     Current Outpatient Medications on File Prior to Visit  Medication Sig   ALPRAZolam (XANAX) 0.5 MG tablet Take      1/2 - 1 tablet       2 - 3 x /day        ONLY       if needed for Anxiety Attack or Sleep &  limit to 5 days /week to avoid Addiction & Dementia   amLODipine (NORVASC) 2.5 MG tablet Take  1 tablet  Daily  for BP                                                    /                            TAKE                BY                        MOUTH   atorvastatin (LIPITOR) 40 MG tablet Take 40 mg by mouth  in the evening twice a week for cholesterol.   azelastine (ASTELIN) 0.1 % nasal spray Use 1 to 2 sprays each nostril 1 to 2 x /day as Needed   BABY ASPIRIN PO Take 81 mg by mouth daily.   Calcium Carbonate-Vitamin D (CALCIUM + D PO) Take 600 mg by mouth daily.    Cetirizine HCl (ZYRTEC ALLERGY PO) Take by mouth.   Cholecalciferol (VITAMIN D PO) Take 4,000 Int'l Units by mouth 2 (two) times daily.    CINNAMON PO Take 1,000 mg by mouth daily.   estradiol (ESTRACE) 1 MG tablet TAKE ONE TABLET BY MOUTH DAILY   ferrous sulfate 325 (65 FE) MG tablet Takes 1 tablet Daily   Multiple Vitamins-Minerals (MULTIVITAMIN PO) Take by mouth. With calcium   triamcinolone cream (KENALOG) 0.1 % APPLY TO AFFECTED AREA TWICE A DAY AS NEEDED   vitamin C (ASCORBIC ACID) 500 MG tablet Take 500 mg by mouth daily.   Zinc 50 MG CAPS Take by mouth daily.   No current facility-administered medications on file prior to visit.    ROS: all negative except above.   Physical Exam:  BP 110/74   Temp (!) 97.2 F (36.2 C)   Ht '5\' 1"'$  (1.549 m)   Wt 125 lb 12.8 oz (57.1 kg)   BMI 23.77 kg/m   General Appearance: Well nourished, in no apparent distress. Eyes: PERRLA, EOMs, conjunctiva no swelling or erythema Sinuses: No Frontal/maxillary  tenderness ENT/Mouth: Ext aud canals clear, TMs without erythema, bulging. No erythema, swelling, or exudate on post pharynx.  Tonsils not swollen or erythematous. Hearing normal.  Neck: Supple, thyroid normal.  Respiratory: Respiratory effort normal, BS equal bilaterally without rales, rhonchi, wheezing or stridor.  Cardio: RRR with no MRGs. Brisk peripheral pulses without edema.  Abdomen: Soft, + BS.  Non tender, no guarding, rebound, hernias, masses. Lymphatics: Non tender without lymphadenopathy.  Musculoskeletal: Full ROM, 5/5 strength, normal gait. Negative CVA tenderness Skin: Warm, dry without rashes, lesions, ecchymosis.  Neuro: Cranial nerves intact. Normal muscle tone, no cerebellar symptoms. Sensation intact.  Psych: Awake and oriented X 3, normal affect, Insight and Judgment appropriate.     Alycia Rossetti, NP 3:34 PM [facility]

## 2022-01-10 LAB — URINE CULTURE
MICRO NUMBER:: 13745261
Result:: NO GROWTH
SPECIMEN QUALITY:: ADEQUATE

## 2022-01-10 LAB — URINALYSIS, ROUTINE W REFLEX MICROSCOPIC
Bacteria, UA: NONE SEEN /HPF
Bilirubin Urine: NEGATIVE
Glucose, UA: NEGATIVE
Hyaline Cast: NONE SEEN /LPF
Ketones, ur: NEGATIVE
Nitrite: NEGATIVE
Protein, ur: NEGATIVE
RBC / HPF: NONE SEEN /HPF (ref 0–2)
Specific Gravity, Urine: 1.004 (ref 1.001–1.035)
Squamous Epithelial / HPF: NONE SEEN /HPF (ref <=5)
WBC, UA: >=60 /HPF — AB (ref 0–5)
pH: 5.5 (ref 5.0–8.0)

## 2022-01-10 LAB — MICROSCOPIC MESSAGE

## 2022-02-13 DIAGNOSIS — F424 Excoriation (skin-picking) disorder: Secondary | ICD-10-CM | POA: Diagnosis not present

## 2022-02-13 DIAGNOSIS — L2089 Other atopic dermatitis: Secondary | ICD-10-CM | POA: Diagnosis not present

## 2022-02-13 DIAGNOSIS — L910 Hypertrophic scar: Secondary | ICD-10-CM | POA: Diagnosis not present

## 2022-03-22 DIAGNOSIS — Z08 Encounter for follow-up examination after completed treatment for malignant neoplasm: Secondary | ICD-10-CM | POA: Diagnosis not present

## 2022-03-22 DIAGNOSIS — L910 Hypertrophic scar: Secondary | ICD-10-CM | POA: Diagnosis not present

## 2022-03-22 DIAGNOSIS — D225 Melanocytic nevi of trunk: Secondary | ICD-10-CM | POA: Diagnosis not present

## 2022-03-22 DIAGNOSIS — Z85828 Personal history of other malignant neoplasm of skin: Secondary | ICD-10-CM | POA: Diagnosis not present

## 2022-03-22 DIAGNOSIS — L570 Actinic keratosis: Secondary | ICD-10-CM | POA: Diagnosis not present

## 2022-03-22 DIAGNOSIS — L814 Other melanin hyperpigmentation: Secondary | ICD-10-CM | POA: Diagnosis not present

## 2022-03-22 DIAGNOSIS — L821 Other seborrheic keratosis: Secondary | ICD-10-CM | POA: Diagnosis not present

## 2022-05-18 ENCOUNTER — Encounter: Payer: Self-pay | Admitting: Internal Medicine

## 2022-05-18 ENCOUNTER — Ambulatory Visit (INDEPENDENT_AMBULATORY_CARE_PROVIDER_SITE_OTHER): Payer: PPO | Admitting: Internal Medicine

## 2022-05-18 VITALS — BP 122/70 | HR 70 | Temp 97.9°F | Resp 16 | Ht 61.0 in | Wt 119.4 lb

## 2022-05-18 DIAGNOSIS — R7309 Other abnormal glucose: Secondary | ICD-10-CM | POA: Diagnosis not present

## 2022-05-18 DIAGNOSIS — Z1211 Encounter for screening for malignant neoplasm of colon: Secondary | ICD-10-CM

## 2022-05-18 DIAGNOSIS — E559 Vitamin D deficiency, unspecified: Secondary | ICD-10-CM

## 2022-05-18 DIAGNOSIS — Z0000 Encounter for general adult medical examination without abnormal findings: Secondary | ICD-10-CM | POA: Diagnosis not present

## 2022-05-18 DIAGNOSIS — I10 Essential (primary) hypertension: Secondary | ICD-10-CM | POA: Diagnosis not present

## 2022-05-18 DIAGNOSIS — Z136 Encounter for screening for cardiovascular disorders: Secondary | ICD-10-CM | POA: Diagnosis not present

## 2022-05-18 DIAGNOSIS — M8580 Other specified disorders of bone density and structure, unspecified site: Secondary | ICD-10-CM

## 2022-05-18 DIAGNOSIS — Z8249 Family history of ischemic heart disease and other diseases of the circulatory system: Secondary | ICD-10-CM

## 2022-05-18 DIAGNOSIS — H01009 Unspecified blepharitis unspecified eye, unspecified eyelid: Secondary | ICD-10-CM

## 2022-05-18 DIAGNOSIS — E782 Mixed hyperlipidemia: Secondary | ICD-10-CM | POA: Diagnosis not present

## 2022-05-18 DIAGNOSIS — Z79899 Other long term (current) drug therapy: Secondary | ICD-10-CM

## 2022-05-18 DIAGNOSIS — Z0001 Encounter for general adult medical examination with abnormal findings: Secondary | ICD-10-CM

## 2022-05-18 NOTE — Progress Notes (Unsigned)
Annual Screening/Preventative Visit & Comprehensive Evaluation &  Examination  Future Appointments  Date Time Provider Department  05/18/2022                           cpe  2:00 PM Unk Pinto, MD [facility]  08/29/2022                            wellness  9:00 AM Alycia Rossetti, NP [facility]          This very nice 71 y.o. MWF presents for a Screening /Preventative Visit & comprehensive evaluation and management of multiple medical co-morbidities.  Patient has been followed for HTN, HLD, Prediabetes  and Vitamin D Deficiency.        Patient has been followed for labile HTN predates since  2001 with treatment started in 2009.  Patient's BP has been controlled at home and patient denies any cardiac symptoms as chest pain, palpitations, shortness of breath, dizziness or ankle swelling. Today's BP is at goal  - 122/70.        Patient's hyperlipidemia is controlled with diet and Lipitor. Patient denies myalgias or other medication SE's. Last lipids were at goal :  Lab Results  Component Value Date   CHOL 206 (H) 11/16/2020   HDL 98 11/16/2020   LDLCALC 91 11/16/2020   TRIG 81 11/16/2020   CHOLHDL 2.1 11/16/2020         Patient has hx/o prediabetes (A1c 5.7% /2015 & 5.9% /2016) and patient denies reactive hypoglycemic symptoms, visual blurring, diabetic polys or paresthesias. Last A1c was borderline :  Lab Results  Component Value Date   HGBA1C 5.7 (H) 11/16/2020         Finally, patient has history of Vitamin D Deficiency ("27" /2009) and last Vitamin D was at goal :  Lab Results  Component Value Date   VD25OH 81 11/16/2020     Current Outpatient Medications:    ALPRAZolam (XANAX) 0.5 MG tablet, Take      1/2 - 1 tablet       2 - 3 x /day        ONLY       if needed    amLODipine (NORVASC) 2.5 MG tablet, Take  1 tablet  Daily    atorvastatin (LIPITOR) 40 MG tablet, Take in the evening twice a week for cholesterol   azelastine (ASTELIN) 0.1 % nasal spray,  Use 1 to 2 sprays each nostril 1 to 2 x /day as Needed   BABY ASPIRIN   81 mg  , Take daily   CALCIUM + D, Take 600 mg b daily   Cetirizine , Take daily   VITAMIN D  4,000 Units , Take 2 times daily   CINNAMON  1,000 mg, Take daily    estradiol (ESTRACE) 1 MG tablet, TAKE ONE TABLET  DAILY   ferrous sulfate 325 (65 FE) MG tablet, Takes 1 tablet Daily   Multiple Vitamins-Minerals w/calcium,    triamcinolone cream (KENALOG) 0.1 %, APPLY TO AFFECTED AREA TWICE A DAY AS NEEDED   vitamin C (ASCORBIC ACID) 500 MG tablet, Take  daily   Zinc 50 MG CAPS, Takedaily   Allergies  Allergen Reactions   Codeine     Past Medical History:  Diagnosis Date   Allergy    DDD (degenerative disc disease), lumbar    Depression    Hyperlipidemia  Hypertension    Osteopenia    Personal history of squamous cell carcinoma of skin 06/03/2019   Raynaud disease 2010    Health Maintenance  Topic Date Due   TETANUS/TDAP  09/28/2021   MAMMOGRAM  05/12/2022   COLONOSCOPY 09/22/2024   Pneumonia Vaccine 15+ Years old  Completed   INFLUENZA VACCINE  Completed   DEXA SCAN  Completed   COVID-19 Vaccine  Completed   Hepatitis C Screening  Completed   Zoster Vaccines- Shingrix  Completed   HPV VACCINES  Aged Out     Immunization History  Administered Date(s) Administered   Fluad Quad(high Dose) 03/14/2021   Influenza  03/04/2019   Influenza 03/14/2018, 02/27/2020   PFIZER SARS-COV-2 Vacc 07/03/2019, 07/25/2019, 03/29/2020, 11/10/2020   Pfizer Covid-19 Vacc Bivalent Booster  03/22/2021   Pneumococcal -13 12/07/2016   Pneumococcal -23 03/04/2019   Pneumococcal-23 02/13/2003   Tdap 09/29/2011   Varicella Zoster Immune Globulin 11/22/2015   Zoster Recombinat (Shingrix) 03/14/2018, 05/20/2018    Last Colon -  09/23/2019- Dr Romilda Garret - recc 5 yr f/u due May 2026   Last MGM -  05/25/2020  Last BMD -  06/01/2020 - Osteopenia   Past Surgical History:  Procedure Laterality Date   ABDOMINAL  HYSTERECTOMY     CATARACT EXTRACTION Right 07/05/2020   Dr. Tommy Rainwater   CATARACT EXTRACTION Left 07/26/2020   Dr. Tommy Rainwater   RHINOPLASTY       Family History  Problem Relation Age of Onset   Diabetes Mother    Hypertension Mother    Arthritis Mother    Stroke Mother        Dec 2016   Arthritis Father        PMR   Cancer Father        colon   Hypertension Father    Macular degeneration Father    Stroke Father        due to TA     Social History   Tobacco Use   Smoking status: Never   Smokeless tobacco: Never  Substance Use Topics   Alcohol use: Yes    Comment: Rare   Drug use: No      ROS Constitutional: Denies fever, chills, weight loss/gain, headaches, insomnia,  night sweats, and change in appetite. Does c/o fatigue. Eyes: Denies redness, blurred vision, diplopia, discharge, itchy, watery eyes.  ENT: Denies discharge, congestion, post nasal drip, epistaxis, sore throat, earache, hearing loss, dental pain, Tinnitus, Vertigo, Sinus pain, snoring.  Cardio: Denies chest pain, palpitations, irregular heartbeat, syncope, dyspnea, diaphoresis, orthopnea, PND, claudication, edema Respiratory: denies cough, dyspnea, DOE, pleurisy, hoarseness, laryngitis, wheezing.  Gastrointestinal: Denies dysphagia, heartburn, reflux, water brash, pain, cramps, nausea, vomiting, bloating, diarrhea, constipation, hematemesis, melena, hematochezia, jaundice, hemorrhoids Genitourinary: Denies dysuria, frequency, urgency, nocturia, hesitancy, discharge, hematuria, flank pain Breast: Breast lumps, nipple discharge, bleeding.  Musculoskeletal: Denies arthralgia, myalgia, stiffness, Jt. Swelling, pain, limp, and strain/sprain. Denies falls. Skin: Denies puritis, rash, hives, warts, acne, eczema, changing in skin lesion Neuro: No weakness, tremor, incoordination, spasms, paresthesia, pain Psychiatric: Denies confusion, memory loss, sensory loss. Denies Depression. Endocrine: Denies change in weight,  skin, hair change, nocturia, and paresthesia, diabetic polys, visual blurring, hyper / hypo glycemic episodes.  Heme/Lymph: No excessive bleeding, bruising, enlarged lymph nodes.  Physical Exam  BP 122/70   Pulse 70   Temp 97.9 F (36.6 C)   Resp 16   Ht '5\' 1"'$  (1.549 m)   Wt 119 lb 6.4 oz (54.2 kg)   SpO2  96%   BMI 22.56 kg/m   General Appearance: Well nourished, well groomed and in no apparent distress.  Eyes: PERRLA, EOMs, conjunctiva no swelling or erythema, normal fundi and vessels. Sinuses: No frontal/maxillary tenderness ENT/Mouth: EACs patent / TMs  nl. Nares clear without erythema, swelling, mucoid exudates. Oral hygiene is good. No erythema, swelling, or exudate. Tongue normal, non-obstructing. Tonsils not swollen or erythematous. Hearing normal.  Neck: Supple, thyroid not palpable. No bruits, nodes or JVD. Respiratory: Respiratory effort normal.  BS equal and clear bilateral without rales, rhonci, wheezing or stridor. Cardio: Heart sounds are normal with regular rate and rhythm and no murmurs, rubs or gallops. Peripheral pulses are normal and equal bilaterally without edema. No aortic or femoral bruits. Chest: symmetric with normal excursions and percussion. Breasts: Symmetric, without lumps, nipple discharge, retractions, or fibrocystic changes.  Abdomen: Flat, soft with bowel sounds active. Nontender, no guarding, rebound, hernias, masses, or organomegaly.  Lymphatics: Non tender without lymphadenopathy.  Musculoskeletal: Full ROM all peripheral extremities, joint stability, 5/5 strength, and normal gait. Skin: Warm and dry without rashes, lesions, cyanosis, clubbing or  ecchymosis.  Neuro: Cranial nerves intact, reflexes equal bilaterally. Normal muscle tone, no cerebellar symptoms. Sensation intact.  Pysch: Alert and oriented X 3, normal affect, Insight and Judgment appropriate.    Assessment and Plan  1. Annual Preventative Screening Examination   2. Essential  hypertension  - EKG 12-Lead - Urinalysis, Routine w reflex microscopic - Microalbumin / creatinine urine ratio - CBC with Differential/Platelet - COMPLETE METABOLIC PANEL WITH GFR - Magnesium - TSH  3. Hyperlipidemia, mixed  - EKG 12-Lead - Lipid panel  4. Abnormal glucose  - EKG 12-Lead - Hemoglobin A1c - Insulin, random  5. Vitamin D deficiency  - TSH  6. Osteopenia, unspecified location  - EKG 12-Lead  7. Screening for ischemic heart disease  - EKG 12-Lead - CBC with Differential/Platelet - COMPLETE METABOLIC PANEL WITH GFR - Magnesium - Lipid panel - TSH - Hemoglobin A1c - Insulin, random  8. FH: hypertension  - EKG 12-Lead  9. Medication management  - Urinalysis, Routine w reflex microscopic - Microalbumin / creatinine urine ratio  10. Screening for colorectal cancer  - Cologuard            Patient was counseled in prudent diet to achieve/maintain BMI less than 25 for weight control, BP monitoring, regular exercise and medications. Discussed med's effects and SE's. Screening labs and tests as requested with regular follow-up as recommended. Over 40 minutes of exam, counseling, chart review and high complex critical decision making was performed.   Kirtland Bouchard, MD

## 2022-05-18 NOTE — Patient Instructions (Signed)

## 2022-05-19 ENCOUNTER — Encounter: Payer: Self-pay | Admitting: Internal Medicine

## 2022-05-19 LAB — CBC WITH DIFFERENTIAL/PLATELET
Absolute Monocytes: 664 cells/uL (ref 200–950)
Basophils Absolute: 49 cells/uL (ref 0–200)
Basophils Relative: 0.6 %
Eosinophils Absolute: 162 cells/uL (ref 15–500)
Eosinophils Relative: 2 %
HCT: 40.9 % (ref 35.0–45.0)
Hemoglobin: 14 g/dL (ref 11.7–15.5)
Lymphs Abs: 3119 cells/uL (ref 850–3900)
MCH: 32.6 pg (ref 27.0–33.0)
MCHC: 34.2 g/dL (ref 32.0–36.0)
MCV: 95.1 fL (ref 80.0–100.0)
MPV: 11.1 fL (ref 7.5–12.5)
Monocytes Relative: 8.2 %
Neutro Abs: 4107 cells/uL (ref 1500–7800)
Neutrophils Relative %: 50.7 %
Platelets: 269 10*3/uL (ref 140–400)
RBC: 4.3 10*6/uL (ref 3.80–5.10)
RDW: 12 % (ref 11.0–15.0)
Total Lymphocyte: 38.5 %
WBC: 8.1 10*3/uL (ref 3.8–10.8)

## 2022-05-19 LAB — HEMOGLOBIN A1C
Hgb A1c MFr Bld: 5.8 % of total Hgb — ABNORMAL HIGH (ref <5.7)
Mean Plasma Glucose: 120 mg/dL
eAG (mmol/L): 6.6 mmol/L

## 2022-05-19 LAB — LIPID PANEL
Cholesterol: 215 mg/dL — ABNORMAL HIGH (ref <200)
HDL: 115 mg/dL (ref >=50)
LDL Cholesterol (Calc): 85 mg/dL (calc)
Non-HDL Cholesterol (Calc): 100 mg/dL (calc) (ref <130)
Total CHOL/HDL Ratio: 1.9 (calc) (ref <5.0)
Triglycerides: 61 mg/dL (ref <150)

## 2022-05-19 LAB — MAGNESIUM: Magnesium: 2.4 mg/dL (ref 1.5–2.5)

## 2022-05-19 LAB — URINALYSIS, ROUTINE W REFLEX MICROSCOPIC
Bilirubin Urine: NEGATIVE
Glucose, UA: NEGATIVE
Hgb urine dipstick: NEGATIVE
Ketones, ur: NEGATIVE
Leukocytes,Ua: NEGATIVE
Nitrite: NEGATIVE
Protein, ur: NEGATIVE
Specific Gravity, Urine: 1.008 (ref 1.001–1.035)
pH: 6 (ref 5.0–8.0)

## 2022-05-19 LAB — VITAMIN D 25 HYDROXY (VIT D DEFICIENCY, FRACTURES): Vit D, 25-Hydroxy: 75 ng/mL (ref 30–100)

## 2022-05-19 LAB — INSULIN, RANDOM: Insulin: 3.2 u[IU]/mL

## 2022-05-19 LAB — COMPLETE METABOLIC PANEL WITH GFR
AG Ratio: 2.3 (calc) (ref 1.0–2.5)
ALT: 28 U/L (ref 6–29)
AST: 29 U/L (ref 10–35)
Albumin: 4.8 g/dL (ref 3.6–5.1)
Alkaline phosphatase (APISO): 114 U/L (ref 37–153)
BUN: 15 mg/dL (ref 7–25)
CO2: 31 mmol/L (ref 20–32)
Calcium: 10.1 mg/dL (ref 8.6–10.4)
Chloride: 104 mmol/L (ref 98–110)
Creat: 0.83 mg/dL (ref 0.60–1.00)
Globulin: 2.1 g/dL (calc) (ref 1.9–3.7)
Glucose, Bld: 86 mg/dL (ref 65–99)
Potassium: 4.1 mmol/L (ref 3.5–5.3)
Sodium: 143 mmol/L (ref 135–146)
Total Bilirubin: 0.7 mg/dL (ref 0.2–1.2)
Total Protein: 6.9 g/dL (ref 6.1–8.1)
eGFR: 76 mL/min/{1.73_m2} (ref >=60)

## 2022-05-19 LAB — TSH: TSH: 1.37 mIU/L (ref 0.40–4.50)

## 2022-05-19 LAB — MICROALBUMIN / CREATININE URINE RATIO
Creatinine, Urine: 35 mg/dL (ref 20–275)
Microalb Creat Ratio: 6 mcg/mg creat (ref <30)
Microalb, Ur: 0.2 mg/dL

## 2022-05-19 MED ORDER — NEOMYCIN-POLYMYXIN-DEXAMETH 3.5-10000-0.1 OP SUSP: OPHTHALMIC | 1 refills | Status: DC

## 2022-05-19 NOTE — Progress Notes (Signed)
<><><><><><><><><><><><><><><><><><><><><><><><><><><><><><><><><> <><><><><><><><><><><><><><><><><><><><><><><><><><><><><><><><><> -   Test results slightly outside the reference range are not unusual. If there is anything important, I will review this with you,  otherwise it is considered normal test values.  If you have further questions,  please do not hesitate to contact me at the office or via My Chart.  <><><><><><><><><><><><><><><><><><><><><><><><><><><><><><><><><> <><><><><><><><><><><><><><><><><><><><><><><><><><><><><><><><><>  -  Total Chol is elevated,                but is OK since have such a high level of Good Protective HDL Cholesterol  <><><><><><><><><><><><><><><><><><><><><><><><><><><><><><><><><> <><><><><><><><><><><><><><><><><><><><><><><><><><><><><><><><><>  - A1c - OK - No Diabetes - Great ! <><><><><><><><><><><><><><><><><><><><><><><><><><><><><><><><><> <><><><><><><><><><><><><><><><><><><><><><><><><><><><><><><><><>  - Vitamin D  = 75  - Excellent - Please continue dose same  <><><><><><><><><><><><><><><><><><><><><><><><><><><><><><><><><> <><><><><><><><><><><><><><><><><><><><><><><><><><><><><><><><><>  -  All Else - CBC - Kidneys - Electrolytes - Liver - Magnesium & Thyroid    - all  Normal / OK <><><><><><><><><><><><><><><><><><><><><><><><><><><><><><><><><> <><><><><><><><><><><><><><><><><><><><><><><><><><><><><><><><><>

## 2022-05-20 ENCOUNTER — Encounter: Payer: Self-pay | Admitting: Internal Medicine

## 2022-05-24 ENCOUNTER — Encounter: Payer: PPO | Admitting: Internal Medicine

## 2022-05-25 ENCOUNTER — Encounter: Payer: PPO | Admitting: Internal Medicine

## 2022-06-01 ENCOUNTER — Encounter: Payer: PPO | Admitting: Internal Medicine

## 2022-06-07 DIAGNOSIS — H00019 Hordeolum externum unspecified eye, unspecified eyelid: Secondary | ICD-10-CM | POA: Diagnosis not present

## 2022-06-08 ENCOUNTER — Ambulatory Visit: Payer: PPO | Admitting: Internal Medicine

## 2022-07-04 ENCOUNTER — Other Ambulatory Visit: Payer: Self-pay | Admitting: Internal Medicine

## 2022-07-04 DIAGNOSIS — Z1231 Encounter for screening mammogram for malignant neoplasm of breast: Secondary | ICD-10-CM

## 2022-07-04 DIAGNOSIS — M8580 Other specified disorders of bone density and structure, unspecified site: Secondary | ICD-10-CM

## 2022-08-04 ENCOUNTER — Encounter: Payer: Self-pay | Admitting: Nurse Practitioner

## 2022-08-04 ENCOUNTER — Ambulatory Visit (INDEPENDENT_AMBULATORY_CARE_PROVIDER_SITE_OTHER): Payer: PPO | Admitting: Nurse Practitioner

## 2022-08-04 VITALS — BP 130/82 | HR 73 | Temp 97.3°F | Ht 61.0 in | Wt 120.4 lb

## 2022-08-04 DIAGNOSIS — M545 Low back pain, unspecified: Secondary | ICD-10-CM

## 2022-08-04 DIAGNOSIS — R300 Dysuria: Secondary | ICD-10-CM

## 2022-08-04 DIAGNOSIS — R310 Gross hematuria: Secondary | ICD-10-CM | POA: Diagnosis not present

## 2022-08-04 DIAGNOSIS — R350 Frequency of micturition: Secondary | ICD-10-CM | POA: Diagnosis not present

## 2022-08-04 MED ORDER — NITROFURANTOIN MONOHYD MACRO 100 MG PO CAPS: 100.0000 mg | ORAL_CAPSULE | Freq: Two times a day (BID) | ORAL | 0 refills | Status: AC

## 2022-08-04 NOTE — Patient Instructions (Signed)
Urinary Tract Infection, Adult A urinary tract infection (UTI) is an infection of any part of the urinary tract. The urinary tract includes: The kidneys. The ureters. The bladder. The urethra. These organs make, store, and get rid of pee (urine) in the body. What are the causes? This infection is caused by germs (bacteria) in your genital area. These germs grow and cause swelling (inflammation) of your urinary tract. What increases the risk? The following factors may make you more likely to develop this condition: Using a small, thin tube (catheter) to drain pee. Not being able to control when you pee or poop (incontinence). Being female. If you are female, these things can increase the risk: Using these methods to prevent pregnancy: A medicine that kills sperm (spermicide). A device that blocks sperm (diaphragm). Having low levels of a female hormone (estrogen). Being pregnant. You are more likely to develop this condition if: You have genes that add to your risk. You are sexually active. You take antibiotic medicines. You have trouble peeing because of: A prostate that is bigger than normal, if you are female. A blockage in the part of your body that drains pee from the bladder. A kidney stone. A nerve condition that affects your bladder. Not getting enough to drink. Not peeing often enough. You have other conditions, such as: Diabetes. A weak disease-fighting system (immune system). Sickle cell disease. Gout. Injury of the spine. What are the signs or symptoms? Symptoms of this condition include: Needing to pee right away. Peeing small amounts often. Pain or burning when peeing. Blood in the pee. Pee that smells bad or not like normal. Trouble peeing. Pee that is cloudy. Fluid coming from the vagina, if you are female. Pain in the belly or lower back. Other symptoms include: Vomiting. Not feeling hungry. Feeling mixed up (confused). This may be the first symptom in  older adults. Being tired and grouchy (irritable). A fever. Watery poop (diarrhea). How is this treated? Taking antibiotic medicine. Taking other medicines. Drinking enough water. In some cases, you may need to see a specialist. Follow these instructions at home:  Medicines Take over-the-counter and prescription medicines only as told by your doctor. If you were prescribed an antibiotic medicine, take it as told by your doctor. Do not stop taking it even if you start to feel better. General instructions Make sure you: Pee until your bladder is empty. Do not hold pee for a long time. Empty your bladder after sex. Wipe from front to back after peeing or pooping if you are a female. Use each tissue one time when you wipe. Drink enough fluid to keep your pee pale yellow. Keep all follow-up visits. Contact a doctor if: You do not get better after 1-2 days. Your symptoms go away and then come back. Get help right away if: You have very bad back pain. You have very bad pain in your lower belly. You have a fever. You have chills. You feeling like you will vomit or you vomit. Summary A urinary tract infection (UTI) is an infection of any part of the urinary tract. This condition is caused by germs in your genital area. There are many risk factors for a UTI. Treatment includes antibiotic medicines. Drink enough fluid to keep your pee pale yellow. This information is not intended to replace advice given to you by your health care provider. Make sure you discuss any questions you have with your health care provider. Document Revised: 01/02/2020 Document Reviewed: 01/02/2020 Elsevier Patient Education    2023 [employer] Inc[employer]  

## 2022-08-04 NOTE — Progress Notes (Signed)
Assessment and Plan:  SHARLOTTE FLAM was seen today for an episodic visit.  Diagnoses and all order for this visit:  Dysuria Stay well hydrated to keep urinary system well flushed. May take OTC AZO for symptom control as needed  - nitrofurantoin, macrocrystal-monohydrate, (MACROBID) 100 MG capsule; Take 1 capsule (100 mg total) by mouth 2 (two) times daily for 7 days.  Dispense: 14 capsule; Refill: 0 - Urinalysis, Routine w reflex microscopic - Urine Culture  Urinary frequency  - Urinalysis, Routine w reflex microscopic - Urine Culture  Gross hematuria Continue to monitor for any increase in gross hematuria. Report to ER if symptoms become uncontrolled and are accompanied by N/V, fever, chills, increase in pain.   - Urinalysis, Routine w reflex microscopic - Urine Culture  Acute bilateral low back pain without sciatica OTC Tylenol or IBU as directed as needed. Warm compress to area as needed  Continue to monitor for any increase in fever, chills, N/V.  Notify office for further evaluation and treatment, questions or concerns if s/s fail to improve. The risks and benefits of my recommendations, as well as other treatment options were discussed with the patient today. Questions were answered.  The Plan of Care is based on a patient-centered health care approach known as shared decision making - the decisions, tests and treatments allow for patient preferences and values to be balanced with clinical evidence.    Further disposition pending results of labs. Discussed med's effects and SE's.    Over 15 minutes of exam, counseling, chart review, and critical decision making was performed.   Future Appointments  Date Time Provider [place]  08/29/2022  9:00 AM Alycia Rossetti, NP [facility] None  08/29/2022 11:30 AM [facility] MM 2 [facility]  11/29/2022  9:30 AM Unk Pinto, MD [facility] None  12/06/2022 11:30 AM [facility] DX DEXA 1 [facility]   05/21/2023  2:00 PM Unk Pinto, MD [facility] None    ------------------------------------------------------------------------------------------------------------------   HPI BP 130/82   Pulse 73   Temp (!) 97.3 F (36.3 C)   Ht '5\' 1"'$  (1.549 m)   Wt 120 lb 6.4 oz (54.6 kg)   SpO2 99%   BMI 22.75 kg/m    Patient complains of dysuria, frequency, and hematuria. Reports a mild pink color when wiping.  She has had symptoms for 1 day. Patient also complains of back pain. Patient denies fever, headache, stomach ache, and vaginal discharge. Patient does have a history of recurrent UTI. Patient does not have a history of pyelonephritis. She has not taken any medications for symptom control.   Past Medical History:  Diagnosis Date   Allergy    DDD (degenerative disc disease), lumbar    Depression    Hyperlipidemia    Hypertension    Osteopenia    Personal history of squamous cell carcinoma of skin 06/03/2019   Raynaud disease 2010     Allergies  Allergen Reactions   Codeine     Current Outpatient Medications on File Prior to Visit  Medication Sig   ALPRAZolam (XANAX) 0.5 MG tablet Take      1/2 - 1 tablet       2 - 3 x /day        ONLY       if needed for Anxiety Attack or Sleep &  limit to 5 days /week to avoid Addiction & Dementia   amLODipine (NORVASC) 2.5 MG tablet Take  1 tablet  Daily  for BP                                                    /  TAKE                BY                        MOUTH   atorvastatin (LIPITOR) 40 MG tablet Take 40 mg by mouth in the evening twice a week for cholesterol.   azelastine (ASTELIN) 0.1 % nasal spray Use 1 to 2 sprays each nostril 1 to 2 x /day as Needed   BABY ASPIRIN PO Take 81 mg by mouth daily.   Calcium Carbonate-Vitamin D (CALCIUM + D PO) Take 600 mg by mouth daily.    Cetirizine HCl (ZYRTEC ALLERGY PO) Take by mouth.   Cholecalciferol (VITAMIN D PO) Take 4,000 Int'l Units by mouth 2 (two) times  daily.    CINNAMON PO Take 1,000 mg by mouth daily.   ferrous sulfate 325 (65 FE) MG tablet Takes 1 tablet Daily   Multiple Vitamins-Minerals (MULTIVITAMIN PO) Take by mouth. With calcium   triamcinolone cream (KENALOG) 0.1 % APPLY TO AFFECTED AREA TWICE A DAY AS NEEDED   vitamin C (ASCORBIC ACID) 500 MG tablet Take 500 mg by mouth daily.   Zinc 50 MG CAPS Take by mouth daily.   estradiol (ESTRACE) 1 MG tablet TAKE ONE TABLET BY MOUTH DAILY   neomycin-polymyxin b-dexamethasone (MAXITROL) 3.5-10000-0.1 SUSP Instill 1 to 2 drops every 2 hours to affected eye on 1st day, then  2sd day continue 4 x /day   No current facility-administered medications on file prior to visit.    ROS: all negative except what is noted in the HPI.   Physical Exam:  BP 130/82   Pulse 73   Temp (!) 97.3 F (36.3 C)   Ht '5\' 1"'$  (1.549 m)   Wt 120 lb 6.4 oz (54.6 kg)   SpO2 99%   BMI 22.75 kg/m   General Appearance: NAD.  Awake, conversant and cooperative. Eyes: PERRLA, EOMs intact.  Sclera white.  Conjunctiva without erythema. Sinuses: No frontal/maxillary tenderness.  No nasal discharge. Nares patent.  ENT/Mouth: Ext aud canals clear.  Bilateral TMs w/DOL and without erythema or bulging. Hearing intact.  Posterior pharynx without swelling or exudate.  Tonsils without swelling or erythema.  Neck: Supple.  No masses, nodules or thyromegaly. Respiratory: Effort is regular with non-labored breathing. Breath sounds are equal bilaterally without rales, rhonchi, wheezing or stridor.  Cardio: RRR with no MRGs. Brisk peripheral pulses without edema.  Abdomen: Active BS in all four quadrants.  Soft and non-tender without guarding, rebound tenderness, hernias or masses. Lymphatics: Non tender without lymphadenopathy.  Musculoskeletal: Full ROM, 5/5 strength, normal ambulation.  No clubbing or cyanosis. Skin: Appropriate color for ethnicity. Warm without rashes, lesions, ecchymosis, ulcers.  Neuro: CN II-XII grossly  normal. Normal muscle tone without cerebellar symptoms and intact sensation.   Psych: AO X 3,  appropriate mood and affect, insight and judgment.     Darrol Jump, NP 9:57 AM [facility]

## 2022-08-05 LAB — URINE CULTURE
MICRO NUMBER:: 14638789
SPECIMEN QUALITY:: ADEQUATE

## 2022-08-05 LAB — URINALYSIS, ROUTINE W REFLEX MICROSCOPIC
Bacteria, UA: NONE SEEN /HPF
Bilirubin Urine: NEGATIVE
Glucose, UA: NEGATIVE
Hyaline Cast: NONE SEEN /LPF
Ketones, ur: NEGATIVE
Nitrite: NEGATIVE
Protein, ur: NEGATIVE
Specific Gravity, Urine: 1.003 (ref 1.001–1.035)
Squamous Epithelial / HPF: NONE SEEN /HPF (ref <=5)
WBC, UA: >=60 /HPF — AB (ref 0–5)
pH: 7 (ref 5.0–8.0)

## 2022-08-05 LAB — MICROSCOPIC MESSAGE

## 2022-08-23 ENCOUNTER — Ambulatory Visit: Payer: PPO

## 2022-08-28 NOTE — Progress Notes (Unsigned)
MEDICARE ANNUAL WELLNESS VISIT  Assessment:   Annual Medicare Wellness Visit Due annually  Health maintenance reviewed Will hold labs until 11/2022 visit  Essential hypertension Continue current medication Monitor blood pressure at home; call if consistently over 130/80 Continue DASH diet.   Reminder to go to the ER if any CP, SOB, nausea, dizziness, severe HA, changes vision/speech, left arm numbness and tingling and jaw pain.  Raynaud's disease without gangrene Continue norvasc, stay warm Avoid triggers  Osteopenia, unspecified location Osteopenia- get dexa after 05/2022, continue Vit D and Ca, weight bearing exercises Suggested K2 supplement   DDD (degenerative disc disease), lumbar Doing well at this time monitor  Hyperlipidemia, unspecified hyperlipidemia type At goal with current medications;  Continue Atorvastatin 40 mg twice weekly; myalgias with higher frequency LDL goal <100; monitor  -check lipids q63m, decrease fatty foods, increase activity.   Depression, major, in remission ([place]) - remains in remission off of medications; continue to monitor closely - stress management techniques discussed, increase water, good sleep hygiene discussed, increase exercise, and increase veggies.   Allergic state, subsequent encounter - Allegra OTC, increase H20, allergy hygiene explained.  Stress incontinence Monitor   Other abnormal glucose Recent A1Cs at goal Discussed diet/exercise, weight management  Defer A1C; monitor serum glucose and weight trends  Vitamin D deficiency Continue supplement  Personal history of squamous cell carcinoma R chest removed 01/2020; continue derm follow up as recommended  Anxiety Well managed by current regimen; continue medications; continue to limit benzo Stress management techniques discussed, increase water, good sleep hygiene discussed, increase exercise, and increase veggies.   BMI 22 Discussed dietary and exercise  modifications  Medication Management Continued  Bladder irritation Given info on interstitial cystitis and diet as cultures have always returned negative If persists would like to refer to urology  Over 30 minutes of face to face interview, exam, counseling, chart review and critical decision making was performed.  Future Appointments  Date Time Provider [place]  08/29/2022 11:30 AM [facility] MM 2 [facility]  11/29/2022  9:30 AM Unk Pinto, MD [facility] None  12/06/2022 11:30 AM [facility] DX DEXA 1 [facility]  05/21/2023  2:00 PM Unk Pinto, MD [facility] None  08/29/2023  9:00 AM Alycia Rossetti, NP [facility] None   Plan:   During the course of the visit the patient was educated and counseled about appropriate screening and preventive services including:   Pneumococcal vaccine  Prevnar 13 Influenza vaccine Td vaccine Screening electrocardiogram Bone densitometry screening Colorectal cancer screening Diabetes screening Glaucoma screening Nutrition counseling  Advanced directives: requested   Subjective:  Alicia Gates is a 72 y.o. female who presents for Medicare Annual Wellness Visit and follow up. She has Hyperlipidemia, mixed; Essential hypertension; Depression, major, in remission ([place]); Allergy; Osteopenia; Raynaud disease; DDD (degenerative disc disease), lumbar; Stress incontinence; Abnormal glucose; Medication management; Personal history of squamous cell carcinoma of skin; Anxiety; and Positive colorectal cancer screening using Cologuard test on their problem list.  She retired in June 2020, Copywriter, advertising, deferring travel plans.  She does have pain that occurs with pain at end of urination. Had similar symptoms in the past. Currently is asymptomatic.   She is off estradiol since October.   Follows with derm/ Dr. Ledell Peoples office annually; recently had squamous cell removed from R chest in 2021 and doing well. Last check  was 03/2022  She had + cologuard screening 06/2021 and was referred to Dr. Paulita Fujita; hx of technically difficult colonoscopies, last colonoscopy  was 09/27/2021 with Dr. Cristina Gong was normal.   BMI is Body mass index is 22.9 kg/m., she has been working on diet and exercise, runs 40-50 min three days a week.  Wt Readings from Last 3 Encounters:  08/29/22 121 lb 3.2 oz (55 kg)  08/04/22 120 lb 6.4 oz (54.6 kg)  05/18/22 119 lb 6.4 oz (54.2 kg)   Her blood pressure has been controlled at home, today their BP is BP: 110/72  BP Readings from Last 3 Encounters:  08/29/22 110/72  08/04/22 130/82  05/18/22 122/70   She does workout. She denies chest pain, shortness of breath, dizziness.   She is on cholesterol medication (on atorvastatin 40 mg twice weekly). Her LDL cholesterol is at goal of LDL <100. The cholesterol last visit was:   Lab Results  Component Value Date   CHOL 215 (H) 05/18/2022   HDL 115 05/18/2022   LDLCALC 85 05/18/2022   TRIG 61 05/18/2022   CHOLHDL 1.9 05/18/2022    She has been working on diet and exercise for glucose management, and denies paresthesia of the feet, polydipsia, polyuria and visual disturbances. Last A1C in the office was:  Lab Results  Component Value Date   HGBA1C 5.8 (H) 05/18/2022   Patient is on Vitamin D supplement.   Lab Results  Component Value Date   VD25OH 75 05/18/2022     Lab Results  Component Value Date   EGFR 76 05/18/2022       Medication Review: Current Outpatient Medications on File Prior to Visit  Medication Sig Dispense Refill   amLODipine (NORVASC) 2.5 MG tablet Take  1 tablet  Daily  for BP                                                    /                            TAKE                BY                        MOUTH 90 tablet 3   atorvastatin (LIPITOR) 40 MG tablet Take 40 mg by mouth in the evening twice a week for cholesterol. 30 tablet 3   azelastine (ASTELIN) 0.1 % nasal spray Use 1 to 2 sprays each nostril 1 to 2 x  /day as Needed 90 mL 3   BABY ASPIRIN PO Take 81 mg by mouth daily.     Calcium Carbonate-Vitamin D (CALCIUM + D PO) Take 600 mg by mouth daily.      Cetirizine HCl (ZYRTEC ALLERGY PO) Take by mouth.     Cholecalciferol (VITAMIN D PO) Take 4,000 Int'l Units by mouth 2 (two) times daily.      CINNAMON PO Take 1,000 mg by mouth daily.     ferrous sulfate 325 (65 FE) MG tablet Takes 1 tablet Daily [phone] tablet 0   hydrocortisone (ANUSOL-HC) 25 MG suppository 1 suppository Rectal Once a day at bedtime for 10 days     Multiple Vitamins-Minerals (MULTIVITAMIN PO) Take by mouth. With calcium     triamcinolone cream (KENALOG) 0.1 % APPLY TO AFFECTED AREA TWICE A DAY AS NEEDED  2  vitamin C (ASCORBIC ACID) 500 MG tablet Take 500 mg by mouth daily.     Zinc 50 MG CAPS Take by mouth daily. Every other day     ALPRAZolam (XANAX) 0.5 MG tablet Take      1/2 - 1 tablet       2 - 3 x /day        ONLY       if needed for Anxiety Attack or Sleep &  limit to 5 days /week to avoid Addiction & Dementia (Patient not taking: Reported on 08/29/2022) 90 tablet 0   No current facility-administered medications on file prior to visit.    Allergies  Allergen Reactions   Codeine     Current Problems (verified) Patient Active Problem List   Diagnosis Date Noted   Positive colorectal cancer screening using Cologuard test 08/25/2021   Anxiety 08/02/2020   Personal history of squamous cell carcinoma of skin 06/03/2019   Medication management 01/04/2017   Abnormal glucose 11/22/2015   Stress incontinence 11/13/2014   Hyperlipidemia, mixed    Essential hypertension    Depression, major, in remission ([place])    Allergy    Osteopenia    Raynaud disease    DDD (degenerative disc disease), lumbar     Screening Tests Immunization History  Administered Date(s) Administered   Fluad Quad(high Dose 65+) 03/14/2021   Influenza Inj Mdck Quad Pf 03/04/2019   Influenza, High Dose Seasonal PF 03/23/2022    Influenza-Unspecified 03/14/2018, 02/27/2020   PFIZER(Purple Top)SARS-COV-2 Vaccination 07/03/2019, 07/25/2019, 03/29/2020, 11/10/2020   Pfizer Covid-19 Vaccine Bivalent Booster 45yrs & up 03/22/2021   Pneumococcal Conjugate-13 12/07/2016   Pneumococcal Polysaccharide-23 03/04/2019   Pneumococcal-Unspecified 02/13/2003   Tdap 09/29/2011   Varicella Zoster Immune Globulin 11/22/2015   Zoster Recombinat (Shingrix) 03/14/2018, 05/20/2018   Pap: 11/2015 normal, does not need another MGM: getting annually at the breast center- appointment today DEXA: 05/12/2020 fem T -1.3 Osteopenia - unchanged Colonoscopy: 09/2019,  Dr. Sundra Aland, Q5years, but has upcoming sheduled 09/2021 EGD: N/A  Names of Other Physician/Practitioners you currently use: 1. [facility] here for primary care 2. Dr. Idolina Primer, eye doctor,appointment today 3. Dr. Noah Charon, dentist 02/2022 4. Lyndle Herrlich, last visit 03/2022, had squamous cell from chest removed 02/2019  Patient Care Team: Unk Pinto, MD as PCP - General (Internal Medicine) Suella Broad, MD as Consulting Physician (Physical Medicine and Rehabilitation) Rockey Situ, Kathlene November, MD as Consulting Physician (Cardiology) Ronald Lobo, MD as Consulting Physician (Gastroenterology) Druscilla Brownie, MD as Consulting Physician (Dermatology)  SURGICAL HISTORY She  has a past surgical history that includes Abdominal hysterectomy; Rhinoplasty; Cataract extraction (Right, 07/05/2020); and Cataract extraction (Left, 07/26/2020). FAMILY HISTORY Her family history includes Arthritis in her father and mother; Cancer in her father; Diabetes in her mother; Hypertension in her father and mother; Macular degeneration in her father; Stroke in her father and mother. SOCIAL HISTORY She  reports that she has never smoked. She has never used smokeless tobacco. She reports current alcohol use. She reports that she does not use  drugs.   MEDICARE WELLNESS OBJECTIVES: Physical activity: Current Exercise Habits: Home exercise routine, Type of exercise: walking (yard work), Time (Minutes): 30, Frequency (Times/Week): 7, Weekly Exercise (Minutes/Week): 210, Intensity: Mild Cardiac risk factors: Cardiac Risk Factors include: advanced age (>47men, >67 women);dyslipidemia;hypertension Depression/mood screen:      08/29/2022    9:32 AM  Depression screen PHQ 2/9  Decreased Interest 0  Down, Depressed, Hopeless 0  PHQ - 2  Score 0    ADLs:     08/29/2022    9:32 AM 12/07/2021    9:47 PM  In your present state of health, do you have any difficulty performing the following activities:  Hearing? 0 0  Vision? 0 0  Difficulty concentrating or making decisions? 0 0  Walking or climbing stairs? 0 0  Dressing or bathing? 0 0  Doing errands, shopping? 0 0     Cognitive Testing  Alert? Yes  Normal Appearance?Yes  Oriented to person? Yes  Place? Yes   Time? Yes  Recall of three objects?  Yes  Can perform simple calculations? Yes  Displays appropriate judgment?Yes  Can read the correct time from a watch face?Yes  EOL planning: Does Patient Have a Medical Advance Directive?: Yes Type of Advance Directive: Healthcare Power of Attorney, Living will Does patient want to make changes to medical advance directive?: No - Patient declined Copy of [place] in Chart?: No - copy requested  Review of Systems  Constitutional: Negative.  Negative for malaise/fatigue and weight loss.  HENT:  Negative for congestion, hearing loss, sinus pain, sore throat and tinnitus.   Eyes: Negative.  Negative for blurred vision and double vision.  Respiratory: Negative.  Negative for cough, sputum production, shortness of breath and wheezing.   Cardiovascular: Negative.  Negative for chest pain, palpitations, orthopnea, claudication, leg swelling and PND.  Gastrointestinal: Negative.  Negative for abdominal pain, blood in  stool, constipation, diarrhea, heartburn, melena, nausea and vomiting.  Genitourinary: Negative.  Negative for dysuria and urgency.  Musculoskeletal:  Negative for back pain, falls, joint pain, myalgias and neck pain.  Skin: Negative.  Negative for rash.  Neurological:  Negative for dizziness, tingling, sensory change, weakness and headaches.  Endo/Heme/Allergies:  Negative for polydipsia.  Psychiatric/Behavioral: Negative.  Negative for depression, memory loss, substance abuse and suicidal ideas. The patient is not nervous/anxious and does not have insomnia.   All other systems reviewed and are negative.    Objective:     Today's Vitals   08/29/22 0900  BP: 110/72  Pulse: 63  Temp: (!) 97.3 F (36.3 C)  SpO2: 98%  Weight: 121 lb 3.2 oz (55 kg)  Height: 5\' 1"  (1.549 m)   Body mass index is 22.9 kg/m.  General appearance: alert, no distress, WD/WN, female HEENT: normocephalic, sclerae anicteric, TMs pearly, nares patent, no discharge or erythema, Oral hygiene is good. No erythema, swelling, or exudate. Tongue normal, non-obstructing. Hearing normal.  Neck: supple, no lymphadenopathy, no thyromegaly, no masses Heart: RRR, normal S1, S2, no murmurs Lungs: CTA bilaterally, no wheezes, rhonchi, or rales Abdomen: +bs, soft, non tender, non distended, no masses, no hepatomegaly, no splenomegaly Musculoskeletal: nontender, no swelling, no obvious deformity Extremities: no edema, no cyanosis, no clubbing Pulses: 2+ symmetric, upper and lower extremities, normal cap refill Neurological: alert, oriented x 3, CN2-12 intact, strength normal upper extremities and lower extremities, sensation normal throughout, DTRs 2+ throughout, no cerebellar signs, gait normal Psychiatric: normal affect, behavior normal, pleasant   Medicare Attestation I have personally reviewed: The patient's medical and social history Their use of alcohol, tobacco or illicit drugs Their current medications and  supplements The patient's functional ability including ADLs,fall risks, home safety risks, cognitive, and hearing and visual impairment Diet and physical activities Evidence for depression or mood disorders  The patient's weight, height, BMI, and visual acuity have been recorded in the chart.  I have made referrals, counseling, and provided education to the patient  based on review of the above and I have provided the patient with a written personalized care plan for preventive services.     Alycia Rossetti, NP   08/29/2022

## 2022-08-29 ENCOUNTER — Ambulatory Visit
Admission: RE | Admit: 2022-08-29 | Discharge: 2022-08-29 | Disposition: A | Discharge status: Home / Self Care | Payer: PPO | Source: Ambulatory Visit | Attending: Internal Medicine | Admitting: Internal Medicine

## 2022-08-29 ENCOUNTER — Ambulatory Visit (INDEPENDENT_AMBULATORY_CARE_PROVIDER_SITE_OTHER): Payer: PPO | Admitting: Nurse Practitioner

## 2022-08-29 ENCOUNTER — Encounter: Payer: Self-pay | Admitting: Nurse Practitioner

## 2022-08-29 VITALS — BP 110/72 | HR 63 | Temp 97.3°F | Ht 61.0 in | Wt 121.2 lb

## 2022-08-29 DIAGNOSIS — N3289 Other specified disorders of bladder: Secondary | ICD-10-CM

## 2022-08-29 DIAGNOSIS — R7309 Other abnormal glucose: Secondary | ICD-10-CM

## 2022-08-29 DIAGNOSIS — M8580 Other specified disorders of bone density and structure, unspecified site: Secondary | ICD-10-CM | POA: Diagnosis not present

## 2022-08-29 DIAGNOSIS — N393 Stress incontinence (female) (male): Secondary | ICD-10-CM

## 2022-08-29 DIAGNOSIS — E782 Mixed hyperlipidemia: Secondary | ICD-10-CM | POA: Diagnosis not present

## 2022-08-29 DIAGNOSIS — Z79899 Other long term (current) drug therapy: Secondary | ICD-10-CM

## 2022-08-29 DIAGNOSIS — F325 Major depressive disorder, single episode, in full remission: Secondary | ICD-10-CM

## 2022-08-29 DIAGNOSIS — Z0001 Encounter for general adult medical examination with abnormal findings: Secondary | ICD-10-CM

## 2022-08-29 DIAGNOSIS — J302 Other seasonal allergic rhinitis: Secondary | ICD-10-CM

## 2022-08-29 DIAGNOSIS — I10 Essential (primary) hypertension: Secondary | ICD-10-CM

## 2022-08-29 DIAGNOSIS — E559 Vitamin D deficiency, unspecified: Secondary | ICD-10-CM

## 2022-08-29 DIAGNOSIS — F419 Anxiety disorder, unspecified: Secondary | ICD-10-CM | POA: Diagnosis not present

## 2022-08-29 DIAGNOSIS — R6889 Other general symptoms and signs: Secondary | ICD-10-CM

## 2022-08-29 DIAGNOSIS — Z1231 Encounter for screening mammogram for malignant neoplasm of breast: Secondary | ICD-10-CM

## 2022-08-29 DIAGNOSIS — M5136 Other intervertebral disc degeneration, lumbar region: Secondary | ICD-10-CM

## 2022-08-29 DIAGNOSIS — Z85828 Personal history of other malignant neoplasm of skin: Secondary | ICD-10-CM

## 2022-08-29 DIAGNOSIS — Z6822 Body mass index (BMI) 22.0-22.9, adult: Secondary | ICD-10-CM

## 2022-08-29 DIAGNOSIS — I7300 Raynaud's syndrome without gangrene: Secondary | ICD-10-CM

## 2022-08-29 DIAGNOSIS — Z0000 Encounter for general adult medical examination without abnormal findings: Secondary | ICD-10-CM

## 2022-08-29 NOTE — Patient Instructions (Signed)
Eating Plan for Interstitial Cystitis Interstitial cystitis (IC) is a long-term (chronic) condition that causes pain and pressure in the bladder, the lower abdomen, and the pelvic area. Other symptoms of IC include urinary urgency and frequency. Symptoms tend to come and go. Many people with IC find that certain foods trigger their symptoms. Different foods may be problematic for different people. Some foods are more likely to cause symptoms than others. Learning which foods bother you and which do not can help you come up with an eating plan to manage IC. What are tips for following this plan? You may find it helpful to work with a dietitian. A dietician can help you develop an eating plan by doing an elimination diet. This diet involves: Creating a list of foods that you think trigger your IC symptoms combined with the foods that most commonly trigger symptoms for many people with IC. It may take several months to find out which foods bother you. Eliminating those foods from your diet for about one month, then reintroducing the foods one at a time to see which ones trigger your symptoms. Reading food labels Once you know which foods trigger your IC symptoms, you can avoid them. However, it is also a good idea to read food labels because some foods that trigger your symptoms may be included as ingredients in other foods. These ingredients may include: Soy. Worcestershire sauce. Vinegar. Alcohol. Artificial sweeteners. Monosodium glutamate. Other potential triggers include: Chili peppers. Tomato products. Citrus fruits, flavors, or juices. Shopping Shopping can be a challenge if many foods trigger your IC. When you go grocery shopping, bring a list of the foods you cannot eat. You can get an app for your phone that lets you know which foods are the safest and which you may want to avoid. You can find the app at the Interstitial Cystitis Network website: www.ic-network.com Meal planning Plan  your meals according to the results of your elimination diet. If you have not done an elimination diet, plan meals according to IC food lists recommended by your health care provider or dietitian. These lists tell you which foods are least and most likely to cause symptoms. Avoid certain types of food when you go out to eat, such as pizza and foods typically served at Indian, Mexican, and Thai restaurants. These foods often contain ingredients that can aggravate IC. General information Here are some general guidelines for an IC eating plan: Do not eat large portions. Drink plenty of fluids with your meals. Do not eat foods that are high in sugar, salt, or saturated fat. Choose whole fruits instead of juice. Eat a colorful variety of vegetables. What foods should I eat? For people with IC, the best diet is a balanced one that includes things from all the food groups. Even if you have to avoid certain foods, there are still plenty of healthy choices in each group. The following are some foods that are least bothersome and may be safest to eat: Fruits Bananas. Blueberries and blueberry juice. Melons. Pears. Apples. Dates. Prunes. Raisins. Apricots. Vegetables Asparagus. Avocado. Celery. Beets. Bell peppers. Black olives. Broccoli. Brussels sprouts. Cabbage. Carrots. Cauliflower. Cucumber. Eggplant. Green beans. Potatoes. Radishes. Spinach. Squash. Turnips. Zucchini. Mushrooms. Peas. Grains Oats. Rice. Bran. Oatmeal. Whole wheat bread. Meats and other proteins Beef. Fish and other seafood. Eggs. Nuts. Peanut butter. Pork. Poultry. Lamb. Garbanzo beans. Pinto beans. Dairy Whole or low-fat milk. American, mozzarella, mild cheddar, feta, ricotta, and cream cheeses. The items listed above may not be a   complete list of foods and beverages you can eat. Contact a dietitian for more information. What foods should I avoid? You should avoid any foods that seem to trigger your symptoms. It is also a good  idea to avoid foods that are most likely to cause symptoms in many people with IC. These include the following: Fruits Citrus fruits, including lemons, limes, oranges, and grapefruit. Cranberries. Strawberries. Pineapple. Kiwi. Vegetables Chili peppers. Onions. Sauerkraut. Tomato and tomato products. Pickles. Grains You do not need to avoid any type of grain unless it triggers your symptoms. Meats and other proteins Precooked or cured meats, such as sausages or meat loaves. Soy products. Dairy Chocolate ice cream. Processed cheese. Yogurt. Beverages Alcohol. Chocolate drinks. Coffee. Cranberry juice. Carbonated drinks. Tea (black, green, or herbal). Tomato juice. Sports drinks. The items listed above may not be a complete list of foods and beverages you should avoid. Contact a dietitian for more information. Summary Many people with IC find that certain foods trigger their symptoms. Different foods may be problematic for different people. You may find it helpful to work with a dietitian to do an elimination diet and come up with an eating plan that is right for you. Plan your meals according to the results of your elimination diet. If you have not done an elimination diet, plan your meals using IC food lists. These lists tell you which foods are least and most likely to cause symptoms. The best diet for people with IC is a balanced diet that includes foods from all the food groups. Even if you have to avoid certain foods, there are still plenty of healthy choices in each group. This information is not intended to replace advice given to you by your health care provider. Make sure you discuss any questions you have with your health care provider. Document Revised: 06/26/2021 Document Reviewed: 06/26/2021 Elsevier Patient Education  2023 Else[employer] Inc.  Interstitial Cystitis  Interstitial cystitis is inflammation of the bladder. This condition is also known as painful bladder syndrome. This  may cause pain in the bladder area as well as a frequent and urgent need to urinate. The bladder is an organ that stores urine after the urine is made in the kidneys. The severity of interstitial cystitis can vary from person to person. You may have flare-ups, and then your symptoms may go away for a while. For many people, it becomes a long-term (chronic) problem. What are the causes? The cause of this condition is not known. What increases the risk? The following factors may make you more likely to develop this condition: Being female. Having fibromyalgia. Having irritable bowel syndrome (IBS). Having endometriosis. Having chronic fatigue syndrome. This condition may be aggravated by: Stress. Smoking. Spicy foods. What are the signs or symptoms? Symptoms of interstitial cystitis vary, and they can change over time. Symptoms may include: Discomfort or pain in the bladder area, which is in the lower abdomen. Pain can range from mild to severe. The pain may change in intensity as the bladder fills with urine or as it empties. Pain in the pelvic area, between the hip bones. A constant urge to urinate. Frequent urination. Pain during urination. Pain during sex. Blood in the urine. Feeling tired (fatigue). For women, symptoms often get worse during menstruation. How is this diagnosed? This condition is diagnosed based on your symptoms, your medical history, and a physical exam. Your health care provider may need to rule out other conditions and may order other tests, such as: Urine tests.   Cystoscopy. For this test, a tool similar to a very thin telescope is used to look into your bladder. Biopsy. This involves taking a sample of tissue from the bladder to be examined under a microscope. How is this treated? There is no cure for this condition, but treatment can help you control your symptoms. Work closely with your health care provider to find the most effective treatments for you.  Treatment options may include: Medicines to relieve pain and reduce how often you feel the need to urinate. This treatment may include: A procedure where a small amount of medicine that eases irritation is put inside your bladder through a catheter (bladder instillation). Lifestyle changes, such as changing your diet or taking steps to control stress. Physical therapy. This may include: Exercises to help relax the pelvic floor muscles. Massage to relax tight muscles (myofascial release). Learning ways to control when you urinate (bladder training). Using a device that provides electrical stimulation to your nerves, which can relieve pain (neuromodulation therapy). The device is placed on your back, where it blocks the nerves that cause you to feel pain in your bladder area. A procedure that stretches your bladder by filling it with air or fluid (hydrodistention). Surgery. This is rare. It is only done for extreme cases, if other treatments do not help. Follow these instructions at home: Lifestyle Learn and practice relaxation techniques, such as deep breathing and muscle relaxation. Get care for your body and mental well-being, such as: Cognitive behavioral therapy (CBT). This therapy changes the way you think or act in response to different situations. This may improve how you feel. Seeing a mental health therapist to evaluate and treat depression, if necessary. Work with your health care provider on other ways to manage pain. Acupuncture may be helpful. Avoid drinking alcohol. Do not use any products that contain nicotine or tobacco. These products include cigarettes, chewing tobacco, and vaping devices, such as e-cigarettes. If you need help quitting, ask your health care provider. Eating and drinking Make dietary changes as recommended by your health care provider. You may need to avoid: Spicy foods. Foods that contain a lot of potassium. Limit your intake of drinks that increase your  urge to urinate. These include alcohol and caffeinated drinks like soda, coffee, and tea. Bladder training  Use bladder training techniques as directed. Techniques may include: Urinating at scheduled times. Training yourself to delay urination. Keep a bladder diary. Write down the times you urinate and any symptoms that you have. This can help you find out which foods, liquids, or activities make your symptoms worse. Use your bladder diary to schedule bathroom trips. If you are away from home, plan to be near a bathroom at each of your scheduled times. Make sure that you urinate just before you leave the house and just before you go to bed. General instructions Take over-the-counter and prescription medicines only as told by your health care provider. Try a warm or cool compress over your bladder for comfort. Avoid wearing tight clothing. Do exercises to relax your pelvic floor muscles as told by your physical therapist. Keep all follow-up visits. This is important. Where to find more information To find more information or a support group near you, visit: [facility] [facility]: urologyhealth.org Interstitial Cystitis Association: ichelp.org Contact a health care provider if you have: Symptoms that do not get better with treatment. Pain or discomfort that gets worse. More frequent urges to urinate. A fever. Get help right away if: You have no control over   when you urinate. Summary Interstitial cystitis is inflammation of the bladder. This condition may cause pain in the bladder area as well as a frequent and urgent need to urinate. You may have flare-ups of the condition, and then it may go away for a while. For many people, it becomes a long-term (chronic) problem. There is no cure for interstitial cystitis, but treatment methods are available to control your symptoms. This information is not intended to replace advice given to you by your health care provider. Make sure you  discuss any questions you have with your health care provider. Document Revised: 12/26/2019 Document Reviewed: 12/26/2019 Elsevier Patient Education  2023 Elsevier Inc.  

## 2022-11-01 DIAGNOSIS — L298 Other pruritus: Secondary | ICD-10-CM | POA: Diagnosis not present

## 2022-11-01 DIAGNOSIS — L820 Inflamed seborrheic keratosis: Secondary | ICD-10-CM | POA: Diagnosis not present

## 2022-11-01 DIAGNOSIS — L578 Other skin changes due to chronic exposure to nonionizing radiation: Secondary | ICD-10-CM | POA: Diagnosis not present

## 2022-11-01 DIAGNOSIS — L570 Actinic keratosis: Secondary | ICD-10-CM | POA: Diagnosis not present

## 2022-11-01 DIAGNOSIS — D485 Neoplasm of uncertain behavior of skin: Secondary | ICD-10-CM | POA: Diagnosis not present

## 2022-11-01 DIAGNOSIS — L538 Other specified erythematous conditions: Secondary | ICD-10-CM | POA: Diagnosis not present

## 2022-11-29 ENCOUNTER — Encounter: Payer: Self-pay | Admitting: Internal Medicine

## 2022-11-29 ENCOUNTER — Ambulatory Visit (INDEPENDENT_AMBULATORY_CARE_PROVIDER_SITE_OTHER): Payer: PPO | Admitting: Internal Medicine

## 2022-11-29 VITALS — BP 118/70 | HR 67 | Temp 97.9°F | Resp 16 | Ht 61.0 in | Wt 128.8 lb

## 2022-11-29 DIAGNOSIS — Z79899 Other long term (current) drug therapy: Secondary | ICD-10-CM | POA: Diagnosis not present

## 2022-11-29 DIAGNOSIS — E559 Vitamin D deficiency, unspecified: Secondary | ICD-10-CM

## 2022-11-29 DIAGNOSIS — J069 Acute upper respiratory infection, unspecified: Secondary | ICD-10-CM

## 2022-11-29 DIAGNOSIS — R7309 Other abnormal glucose: Secondary | ICD-10-CM | POA: Diagnosis not present

## 2022-11-29 DIAGNOSIS — I10 Essential (primary) hypertension: Secondary | ICD-10-CM | POA: Diagnosis not present

## 2022-11-29 DIAGNOSIS — E782 Mixed hyperlipidemia: Secondary | ICD-10-CM

## 2022-11-29 DIAGNOSIS — J302 Other seasonal allergic rhinitis: Secondary | ICD-10-CM | POA: Diagnosis not present

## 2022-11-29 LAB — CBC WITH DIFFERENTIAL/PLATELET
Eosinophils Absolute: 189 cells/uL (ref 15–500)
Hemoglobin: 14 g/dL (ref 11.7–15.5)
Monocytes Relative: 8.8 %
RBC: 4.39 10*6/uL (ref 3.80–5.10)
Total Lymphocyte: 28.6 %

## 2022-11-29 MED ORDER — ATORVASTATIN CALCIUM 40 MG PO TABS
ORAL_TABLET | ORAL | 3 refills | Status: DC
Start: 2022-11-29 — End: 2023-10-02

## 2022-11-29 MED ORDER — AMLODIPINE BESYLATE 2.5 MG PO TABS: ORAL_TABLET | ORAL | 3 refills | Status: DC

## 2022-11-29 MED ORDER — MONTELUKAST SODIUM 10 MG PO TABS
ORAL_TABLET | ORAL | 3 refills | Status: DC
Start: 2022-11-29 — End: 2023-10-02

## 2022-11-29 MED ORDER — AZELASTINE HCL 0.1 % NA SOLN: NASAL | 3 refills | Status: DC

## 2022-11-29 NOTE — Progress Notes (Signed)
Future Appointments  Date Time Provider Department  11/29/2022                 6 mo ov  9:30 AM Lucky Cowboy, MD [facility]  05/21/2023                cpe  2:00 PM Lucky Cowboy, MD [facility]  08/29/2023                 wellness  9:00 AM Raynelle Dick, NP [facility]    History of Present Illness:       This very nice 72 y.o. MWF presents for 6  month follow up with HTN, HLD, Pre-Diabetes and Vitamin D Deficiency.        Patient is followed for HTN since 2001 initiating treatment in 2009.  BP has been controlled and today's BP is  118/70 . Patient denies complaints of any cardiac type chest pain, palpitations, dyspnea Pollyann Kennedy /PND, dizziness, claudication or dependent edema.         Hyperlipidemia is controlled with diet & Atorvastatin . Patient denies myalgias or other med SE's. Last Lipids were at goal with a LDL 85 & an extremely high HDL level of 115 !  Lab Results  Component Value Date   CHOL 215 (H) 05/18/2022   HDL 115 05/18/2022   LDLCALC 85 05/18/2022   TRIG 61 05/18/2022   CHOLHDL 1.9 05/18/2022     Also, the patient has history of PreDiabetes (A1c 5.7%  /2015 & 5.9% /2016) and has had no symptoms of reactive hypoglycemia, diabetic polys, paresthesias or visual blurring.  Last A1c was normal & at goal :  Lab Results  Component Value Date   HGBA1C 5.8 (H) 05/18/2022                                                         Further, the patient also has history of Vitamin D Deficiency ("27" /2009) and supplements vitamin D without any suspected side-effects. Last vitamin D was at goal :  Lab Results  Component Value Date   VD25OH 13 05/18/2022      Current Outpatient Medications  Medication Instructions   amLODipine (NORVASC) 2.5 MG tablet Take  1 tablet  Daily   ascorbic acid (VITAMIN C) 500 mg, Daily   atorvastatin (LIPITOR) 40 MG tablet Take  1 tablet  2 x /week   azelastine (ASTELIN) 0.1 % nasal spray Use 1 to 2 sprays each nostril 1 to 2  x /day as Needed   BABY ASPIRIN PO 81 mg, Daily   Calcium Carbonate-Vitamin D  600 mg, Daily   ZYRTEC  Oral   VITAMIN D  4,000 Int'l Units, Oral, 2 times daily   CINNAMON  1,000 mg, Oral, Daily   ferrous sulfate 325 (65 FE) MG tablet Takes 1 tablet Daily   montelukast (SINGULAIR) 10 MG tablet Take  1 tablet   Daily  for Allergies   Multiple Vitamins-Minerals Oral, With calcium    triamcinolone cream (KENALOG) 0.1 % APPLY TO AFFECTED AREA TWICE A DAY AS NEEDED   Zinc 50 MG CAPS Oral, Daily, Every other day     Allergies  Allergen Reactions   Codeine     PMHx:   Past Medical History:  Diagnosis Date   Allergy  DDD (degenerative disc disease), lumbar    Depression    Hyperlipidemia    Hypertension    Osteopenia    Personal history of squamous cell carcinoma of skin 06/03/2019   Raynaud disease 2010     Immunization History  Administered Date(s) Administered   Influenza Inj Mdck Quad  03/04/2019   Influenza- 03/14/2018, 02/27/2020   PFIZER  SARS-COV-2 Vaccination 07/03/2019, 07/25/2019, 03/29/2020, 11/10/2020   Pneumococcal -13 12/07/2016   Pneumococcal -23 03/04/2019   Pneumococcal- 23 02/13/2003   Tdap 09/29/2011   Varicella Zoster Immune Globulin 11/22/2015   Zoster Recombinat (Shingrix) 03/14/2018, 05/20/2018     Past Surgical History:  Procedure Laterality Date   ABDOMINAL HYSTERECTOMY     CATARACT EXTRACTION Right 07/05/2020   Dr. Darel Hong   CATARACT EXTRACTION Left 07/26/2020   Dr. Darel Hong   RHINOPLASTY      FHx:    Reviewed / unchanged  SHx:    Reviewed / unchanged   Systems Review:  Constitutional: Denies fever, chills, wt changes, headaches, insomnia, fatigue, night sweats, change in appetite. Eyes: Denies redness, blurred vision, diplopia, discharge, itchy, watery eyes.  ENT: Denies discharge, congestion, post nasal drip, epistaxis, sore throat, earache, hearing loss, dental pain, tinnitus, vertigo, sinus pain, snoring.  CV: Denies chest pain,  palpitations, irregular heartbeat, syncope, dyspnea, diaphoresis, orthopnea, PND, claudication or edema. Respiratory: denies cough, dyspnea, DOE, pleurisy, hoarseness, laryngitis, wheezing.  Gastrointestinal: Denies dysphagia, odynophagia, heartburn, reflux, water brash, abdominal pain or cramps, nausea, vomiting, bloating, diarrhea, constipation, hematemesis, melena, hematochezia  or hemorrhoids. Genitourinary: Denies dysuria, frequency, urgency, nocturia, hesitancy, discharge, hematuria or flank pain. Musculoskeletal: Denies arthralgias, myalgias, stiffness, jt. swelling, pain, limping or strain/sprain.  Skin: Denies pruritus, rash, hives, warts, acne, eczema or change in skin lesion(s). Neuro: No weakness, tremor, incoordination, spasms, paresthesia or pain. Psychiatric: Denies confusion, memory loss or sensory loss. Endo: Denies change in weight, skin or hair change.  Heme/Lymph: No excessive bleeding, bruising or enlarged lymph nodes.  Physical Exam  BP 118/70   Pulse 67   Temp 97.9 F (36.6 C)   Resp 16   Ht 5\' 1"  (1.549 m)   Wt 128 lb 12.8 oz (58.4 kg)   SpO2 98%   BMI 24.34 kg/m   Appears  well nourished, well groomed  and in no distress.  Eyes: PERRLA, EOMs, conjunctiva no swelling or erythema. Sinuses: No frontal/maxillary tenderness ENT/Mouth: EAC's clear, TM's nl w/o erythema, bulging. Nares clear w/o erythema, swelling, exudates. Oropharynx clear without erythema or exudates. Oral hygiene is good. Tongue normal, non obstructing. Hearing intact.  Neck: Supple. Thyroid not palpable. Car 2+/2+ without bruits, nodes or JVD. Chest: Respirations nl with BS clear & equal w/o rales, rhonchi, wheezing or stridor.  Cor: Heart sounds normal w/ regular rate and rhythm without sig. murmurs, gallops, clicks or rubs. Peripheral pulses normal and equal  without edema.  Abdomen: Soft & bowel sounds normal. Non-tender w/o guarding, rebound, hernias, masses or organomegaly.  Lymphatics:  Unremarkable.  Musculoskeletal: Full ROM all peripheral extremities, joint stability, 5/5 strength and normal gait.  Skin: Warm, dry without exposed rashes, lesions or ecchymosis apparent.  Neuro: Cranial nerves intact, reflexes equal bilaterally. Sensory-motor testing grossly intact. Tendon reflexes grossly intact.  Pysch: Alert & oriented x 3.  Insight and judgement nl & appropriate. No ideations.  Assessment and Plan:   1. Essential hypertension  - Continue medication, monitor blood pressure at home.  - Continue DASH diet.  Reminder to go to the ER if any CP,  SOB, nausea, dizziness, severe HA, changes vision/speech. - CBC with Differential/Platelet - COMPLETE METABOLIC PANEL WITH GFR - Magnesium - TSH  2. Hyperlipidemia, mixed  - Continue diet/meds, exercise,& lifestyle modifications.  - Continue monitor periodic cholesterol/liver & renal functions  - Lipid panel - TSH  3. Abnormal glucose  - Continue diet, exercise  - Lifestyle modifications.  - Monitor appropriate labs - Hemoglobin A1c - Insulin, random  4. Vitamin D deficiency  - Continue supplementation  - VITAMIN D 25 Hydroxy  5. Medication management  - CBC with Differential/Platelet - COMPLETE METABOLIC PANEL WITH GFR - Magnesium - Lipid panel - TSH - Hemoglobin A1c - Insulin, random - VITAMIN D 25 Hydroxy           Discussed  regular exercise, BP monitoring, weight control to achieve/maintain BMI less than 25 and discussed med and SE's. Recommended labs to assess and monitor clinical status with further disposition pending results of labs.  I discussed the assessment and treatment plan with the patient. The patient was provided an opportunity to ask questions and all were answered. The patient agreed with the plan and demonstrated an understanding of the instructions.  I provided over 30 minutes of exam, counseling, chart review and  complex critical decision making.        The patient was advised to  call back or seek an in-person evaluation if the symptoms worsen or if the condition fails to improve as anticipated.   Marinus Maw, MD

## 2022-11-29 NOTE — Patient Instructions (Signed)

## 2022-11-30 LAB — CBC WITH DIFFERENTIAL/PLATELET
Absolute Monocytes: 537 cells/uL (ref 200–950)
Basophils Absolute: 31 cells/uL (ref 0–200)
Basophils Relative: 0.5 %
Eosinophils Relative: 3.1 %
HCT: 42.1 % (ref 35.0–45.0)
Lymphs Abs: 1745 cells/uL (ref 850–3900)
MCH: 31.9 pg (ref 27.0–33.0)
MCHC: 33.3 g/dL (ref 32.0–36.0)
MCV: 95.9 fL (ref 80.0–100.0)
MPV: 10.9 fL (ref 7.5–12.5)
Neutro Abs: 3599 cells/uL (ref 1500–7800)
Neutrophils Relative %: 59 %
Platelets: 270 10*3/uL (ref 140–400)
RDW: 12.3 % (ref 11.0–15.0)
WBC: 6.1 10*3/uL (ref 3.8–10.8)

## 2022-11-30 LAB — COMPLETE METABOLIC PANEL WITH GFR
AG Ratio: 2.1 (calc) (ref 1.0–2.5)
ALT: 23 U/L (ref 6–29)
AST: 23 U/L (ref 10–35)
Albumin: 4.5 g/dL (ref 3.6–5.1)
Alkaline phosphatase (APISO): 164 U/L — ABNORMAL HIGH (ref 37–153)
BUN: 16 mg/dL (ref 7–25)
CO2: 30 mmol/L (ref 20–32)
Calcium: 10.1 mg/dL (ref 8.6–10.4)
Chloride: 105 mmol/L (ref 98–110)
Creat: 0.95 mg/dL (ref 0.60–1.00)
Globulin: 2.1 g/dL (calc) (ref 1.9–3.7)
Glucose, Bld: 89 mg/dL (ref 65–99)
Potassium: 4.9 mmol/L (ref 3.5–5.3)
Sodium: 143 mmol/L (ref 135–146)
Total Bilirubin: 0.5 mg/dL (ref 0.2–1.2)
Total Protein: 6.6 g/dL (ref 6.1–8.1)
eGFR: 64 mL/min/{1.73_m2} (ref >=60)

## 2022-11-30 LAB — INSULIN, RANDOM: Insulin: 9.4 u[IU]/mL

## 2022-11-30 LAB — HEMOGLOBIN A1C
Hgb A1c MFr Bld: 5.8 % of total Hgb — ABNORMAL HIGH (ref <5.7)
Mean Plasma Glucose: 120 mg/dL
eAG (mmol/L): 6.6 mmol/L

## 2022-11-30 LAB — LIPID PANEL
Cholesterol: 201 mg/dL — ABNORMAL HIGH (ref <200)
HDL: 104 mg/dL (ref >=50)
LDL Cholesterol (Calc): 82 mg/dL (calc)
Non-HDL Cholesterol (Calc): 97 mg/dL (calc) (ref <130)
Total CHOL/HDL Ratio: 1.9 (calc) (ref <5.0)
Triglycerides: 74 mg/dL (ref <150)

## 2022-11-30 LAB — TSH: TSH: 1.61 mIU/L (ref 0.40–4.50)

## 2022-11-30 LAB — VITAMIN D 25 HYDROXY (VIT D DEFICIENCY, FRACTURES): Vit D, 25-Hydroxy: 66 ng/mL (ref 30–100)

## 2022-11-30 LAB — MAGNESIUM: Magnesium: 2.4 mg/dL (ref 1.5–2.5)

## 2022-11-30 NOTE — Progress Notes (Signed)
^<^<^<^<^<^<^<^<^<^<^<^<^<^<^<^<^<^<^<^<^<^<^<^<^<^<^<^<^<^<^<^<^<^<^<^<^ ^>^>^>^>^>^>^>^>^>^>^>>^>^>^>^>^>^>^>^>^>^>^>^>^>^>^>^>^>^>^>^>^>^>^>^>^>  -    Chol = 210 is OK since have such a high Good HDL level of 104 and                                                                                                a low LDL of 82   - So  it's a Very low risk for Heart Attack  / Stroke  ^>^>^>^>^>^>^>^>^>^>^>^>^>^>^>^>^>^>^>^>^>^>^>^>^>^>^>^>^>^>^>^>^>^>^>^>^ ^>^>^>^>^>^>^>^>^>^>^>^>^>^>^>^>^>^>^>^>^>^>^>^>^>^>^>^>^>^>^>^>^>^>^>^>^  -  A1c - 12 week aberage Blood Sugar is borderline or slightly elevated,   So  . . . Marland Kitchen   - Avoid Sweets, Candy & White Stuff   - White Rice, White [place], [place] Flour  - Breads &  Pasta  ^<^<^<^<^<^<^<^<^<^<^<^<^<^<^<^<^<^<^<^<^<^<^<^<^<^<^<^<^<^<^<^<^<^<^<^<^ ^>^>^>^>^>^>^>^>^>^>^>^>^>^>^>^>^>^>^>^>^>^>^>^>^>^>^>^>^>^>^>^>^>^>^>^>^  - Vitamin D = 66 - Great - Please keep dose same   ^>^>^>^>^>^>^>^>^>^>^>^>^>^>^>^>^>^>^>^>^>^>^>^>^>^>^>^>^>^>^>^>^>^>^>^>^ ^>^>^>^>^>^>^>^>^>^>^>^>^>^>^>^>^>^>^>^>^>^>^>^>^>^>^>^>^>^>^>^>^>^>^>^>^  -  Only 1 lab - Alkaline Phosphatase is only very minimally or slightly elevated  &                                                                               will repeat at next OV in 6  monhs   ^>^>^>^>^>^>^>^>^>^>^>^>^>^>^>^>^>^>^>^>^>^>^>^>^>^>^>^>^>^>^>^>^>^>^>^>^ ^>^>^>^>^>^>^>^>^>^>^>^>^>^>^>^>^>^>^>^>^>^>^>^>^>^>^>^>^>^>^>^>^>^>^>^>^

## 2022-12-06 ENCOUNTER — Ambulatory Visit
Admission: RE | Admit: 2022-12-06 | Discharge: 2022-12-06 | Disposition: A | Discharge status: Home / Self Care | Payer: PPO | Source: Ambulatory Visit | Attending: Internal Medicine | Admitting: Internal Medicine

## 2022-12-06 DIAGNOSIS — M8580 Other specified disorders of bone density and structure, unspecified site: Secondary | ICD-10-CM

## 2022-12-06 DIAGNOSIS — E349 Endocrine disorder, unspecified: Secondary | ICD-10-CM | POA: Diagnosis not present

## 2022-12-06 DIAGNOSIS — M8588 Other specified disorders of bone density and structure, other site: Secondary | ICD-10-CM | POA: Diagnosis not present

## 2022-12-06 NOTE — Progress Notes (Signed)
^<^<^<^<^<^<^<^<^<^<^<^<^<^<^<^<^<^<^<^<^<^<^<^<^<^<^<^<^<^<^<^<^<^<^<^<^ ^>^>^>^>^>^>^>^>^>^>^>>^>^>^>^>^>^>^>^>^>^>^>^>^>^>^>^>^>^>^>^>^>^>^>^>^>  -   Bone Density shows  OSTEOPENIA  (Not Osteoporosis)   Which is early thinning of the bone  - So recommendations are :  To continue Calcium 500-600 mg  / day, Vitamin D,  Daily Exercise  and   also recommend take Vitamin K2 ( MK-7)  - can get on [employer]   ^<^<^<^<^<^<^<^<^<^<^<^<^<^<^<^<^<^<^<^<^<^<^<^<^<^<^<^<^<^<^<^<^<^<^<^<^ ^>^>^>^>^>^>^>^>^>^>^>^>^>^>^>^>^>^>^>^>^>^>^>^>^>^>^>^>^>^>^>^>^>^>^>^>^

## 2022-12-21 DIAGNOSIS — Z09 Encounter for follow-up examination after completed treatment for conditions other than malignant neoplasm: Secondary | ICD-10-CM | POA: Diagnosis not present

## 2022-12-21 DIAGNOSIS — L244 Irritant contact dermatitis due to drugs in contact with skin: Secondary | ICD-10-CM | POA: Diagnosis not present

## 2022-12-21 DIAGNOSIS — D485 Neoplasm of uncertain behavior of skin: Secondary | ICD-10-CM | POA: Diagnosis not present

## 2022-12-21 DIAGNOSIS — C44519 Basal cell carcinoma of skin of other part of trunk: Secondary | ICD-10-CM | POA: Diagnosis not present

## 2023-01-29 DIAGNOSIS — C44519 Basal cell carcinoma of skin of other part of trunk: Secondary | ICD-10-CM | POA: Diagnosis not present

## 2023-03-29 DIAGNOSIS — Z85828 Personal history of other malignant neoplasm of skin: Secondary | ICD-10-CM | POA: Diagnosis not present

## 2023-03-29 DIAGNOSIS — L814 Other melanin hyperpigmentation: Secondary | ICD-10-CM | POA: Diagnosis not present

## 2023-03-29 DIAGNOSIS — L578 Other skin changes due to chronic exposure to nonionizing radiation: Secondary | ICD-10-CM | POA: Diagnosis not present

## 2023-03-29 DIAGNOSIS — Z08 Encounter for follow-up examination after completed treatment for malignant neoplasm: Secondary | ICD-10-CM | POA: Diagnosis not present

## 2023-03-29 DIAGNOSIS — D225 Melanocytic nevi of trunk: Secondary | ICD-10-CM | POA: Diagnosis not present

## 2023-03-29 DIAGNOSIS — Z09 Encounter for follow-up examination after completed treatment for conditions other than malignant neoplasm: Secondary | ICD-10-CM | POA: Diagnosis not present

## 2023-03-29 DIAGNOSIS — L570 Actinic keratosis: Secondary | ICD-10-CM | POA: Diagnosis not present

## 2023-03-29 DIAGNOSIS — L821 Other seborrheic keratosis: Secondary | ICD-10-CM | POA: Diagnosis not present

## 2023-04-03 ENCOUNTER — Ambulatory Visit: Payer: PPO | Admitting: Nurse Practitioner

## 2023-05-20 ENCOUNTER — Encounter: Payer: Self-pay | Admitting: Internal Medicine

## 2023-05-20 NOTE — Patient Instructions (Signed)

## 2023-05-20 NOTE — Progress Notes (Unsigned)
[place]      ADULT   &   ADOLESCENT      INTERNAL MEDICINE  Lucky Cowboy, M.D.          Rance Muir, ANP        Adela Glimpse, FNP  [facility] [address]  [place], [place]. [address] Telephone [phone] Telefax [phone]   Annual Screening/Preventative Visit & Comprehensive Evaluation &  Examination   Future Appointments  Date Time Provider Department  05/21/2023                    cpe  2:00 PM Lucky Cowboy, MD [facility]  08/29/2023                   wellness  9:00 AM Raynelle Dick, NP [facility]  06/10/2024                    cpe  2:00 PM Lucky Cowboy, MD [facility]        This very nice 72 y.o. MWF presents for a Screening /Preventative Visit & comprehensive evaluation and management of multiple medical co-morbidities.  Patient has been followed for HTN, HLD, Prediabetes  and Vitamin D Deficiency.         Patient has been followed for labile HTN predates since  2001 with treatment started in 2009.  Patient's BP has been controlled at home and patient denies any cardiac symptoms as chest pain, palpitations, shortness of breath, dizziness or ankle swelling. Today's BP is at goal  -  110/70 .         Patient's hyperlipidemia is controlled with diet and Lipitor. Patient denies myalgias or other medication SE's. Last lipids were at goal :  Lab Results  Component Value Date   CHOL 201 (H) 11/29/2022   HDL 104 11/29/2022   LDLCALC 82 11/29/2022   TRIG 74 11/29/2022   CHOLHDL 1.9 11/29/2022         Patient has hx/o prediabetes (A1c 5.7% /2015 & 5.9% /2016) and patient denies reactive hypoglycemic symptoms, visual blurring, diabetic polys or paresthesias. Last A1c was borderline :  Lab Results  Component Value Date   HGBA1C 5.8 (H) 11/29/2022         Finally, patient has history of Vitamin D Deficiency ("27" /2009) and last Vitamin D was at goal :  Lab Results  Component Value Date   VD25OH 66 11/29/2022        Current Outpatient Medications  Medication Instructions   amLODipine  2.5 MG tablet Take  1 tablet  Daily   VITAMIN C  500 mg  Daily   LIPITOR 40 MG tablet Take  1 tablet  2 x /week    ASTELIN 0.1 % nasal spray Use 1-2 sprays  1-2 x /day as Needed   BABY ASPIRIN   81 mg Daily   Calcium - 600 mgVitamin D  Daily   Cetirizine 10 mg  Daily   VITAMIN D 4,000  Units 2 times daily   CINNAMON 1,000 mg Daily   ferrous sulfate 325 (65 FE) MG tablet Takes 1 tablet Daily   montelukast 10 MG tablet Take  1 tablet   Daily  for Allergies   Multiple Vitamins-Minerals With calcium    triamcinolone cream 0.1 % APPLY TWICE A DAY AS NEEDED   Zinc 50 MG CAPS Every other day     Allergies  Allergen Reactions   Codeine     Past  Medical History:  Diagnosis Date   Allergy    DDD (degenerative disc disease), lumbar    Depression    Hyperlipidemia    Hypertension    Osteopenia    Personal history of squamous cell carcinoma of skin 06/03/2019   Raynaud disease 2010    Health Maintenance  Topic Date Due   TETANUS/TDAP  09/28/2021   MAMMOGRAM  05/12/2022   COLONOSCOPY 09/22/2024   Pneumonia Vaccine 79+ Years old  Completed   INFLUENZA VACCINE  Completed   DEXA SCAN  Completed   COVID-19 Vaccine  Completed   Hepatitis C Screening  Completed   Zoster Vaccines- Shingrix  Completed   HPV VACCINES  Aged Out     Immunization History  Administered Date(s) Administered   Fluad Quad(high Dose) 03/14/2021   Influenza  03/04/2019   Influenza 03/14/2018, 02/27/2020   PFIZER SARS-COV-2 Vacc 07/03/2019, 07/25/2019, 03/29/2020, 11/10/2020   Pfizer Covid-19 Vacc Bivalent Booster  03/22/2021   Pneumococcal -13 12/07/2016   Pneumococcal -23 03/04/2019   Pneumococcal-23 02/13/2003   Tdap 09/29/2011   Varicella Zoster Immune Globulin 11/22/2015   Zoster Recombinat (Shingrix) 03/14/2018, 05/20/2018    Last Colon -  09/23/2019- Dr Rosana Hoes - recc 5 yr f/u due May 2026   Cologard - 06/08/2021 -  Positive   Colonoscopy - 09/27/2021 Deboraha Sprang / [place] ( report not sent yet)   Last MGM -  08/30/2022  Last BMD -  12/06/2022 - Osteopenia ( T-1.7 )   Past Surgical History:  Procedure Laterality Date   ABDOMINAL HYSTERECTOMY     CATARACT EXTRACTION Right 07/05/2020   Dr. Darel Hong   CATARACT EXTRACTION Left 07/26/2020   Dr. Darel Hong   RHINOPLASTY       Family History  Problem Relation Age of Onset   Diabetes Mother    Hypertension Mother    Arthritis Mother    Stroke Mother        Dec 2016   Arthritis Father        PMR   Cancer Father        colon   Hypertension Father    Macular degeneration Father    Stroke Father        due to TA     Social History   Tobacco Use   Smoking status: Never   Smokeless tobacco: Never  Substance Use Topics   Alcohol use: Yes    Comment: Rare   Drug use: No      ROS Constitutional: Denies fever, chills, weight loss/gain, headaches, insomnia,  night sweats, and change in appetite. Does c/o fatigue. Eyes: Denies redness, blurred vision, diplopia, discharge, itchy, watery eyes.  ENT: Denies discharge, congestion, post nasal drip, epistaxis, sore throat, earache, hearing loss, dental pain, Tinnitus, Vertigo, Sinus pain, snoring.  Cardio: Denies chest pain, palpitations, irregular heartbeat, syncope, dyspnea, diaphoresis, orthopnea, PND, claudication, edema Respiratory: denies cough, dyspnea, DOE, pleurisy, hoarseness, laryngitis, wheezing.  Gastrointestinal: Denies dysphagia, heartburn, reflux, water brash, pain, cramps, nausea, vomiting, bloating, diarrhea, constipation, hematemesis, melena, hematochezia, jaundice, hemorrhoids Genitourinary: Denies dysuria, frequency, urgency, nocturia, hesitancy, discharge, hematuria, flank pain Breast: Breast lumps, nipple discharge, bleeding.  Musculoskeletal: Denies arthralgia, myalgia, stiffness, Jt. Swelling, pain, limp, and strain/sprain. Denies falls. Skin: Denies puritis, rash, hives, warts,  acne, eczema, changing in skin lesion Neuro: No weakness, tremor, incoordination, spasms, paresthesia, pain Psychiatric: Denies confusion, memory loss, sensory loss. Denies Depression. Endocrine: Denies change in weight, skin, hair change, nocturia, and paresthesia, diabetic polys, visual blurring, hyper / hypo glycemic  episodes.  Heme/Lymph: No excessive bleeding, bruising, enlarged lymph nodes.  Physical Exam  BP 110/70   Pulse 70   Temp 98 F (36.7 C)   Resp 16   Ht 5\' 1"  (1.549 m)   Wt 124 lb 9.6 oz (56.5 kg)   SpO2 96%   BMI 23.54 kg/m   General Appearance: Well nourished, well groomed and in no apparent distress.  Eyes: PERRLA, EOMs, conjunctiva no swelling or erythema, normal fundi and vessels. Rt upper eye id is slightly "puffy" & erythematous.  Sinuses: No frontal/maxillary tenderness ENT/Mouth: EACs patent / TMs  nl. Nares clear without erythema, swelling, mucoid exudates. Oral hygiene is good. No erythema, swelling, or exudate. Tongue normal, non-obstructing. Tonsils not swollen or erythematous. Hearing normal.  Neck: Supple, thyroid not palpable. No bruits, nodes or JVD. Respiratory: Respiratory effort normal.  BS equal and clear bilateral without rales, rhonci, wheezing or stridor. Cardio: Heart sounds are normal with regular rate and rhythm and no murmurs, rubs or gallops. Peripheral pulses are normal and equal bilaterally without edema. No aortic or femoral bruits. Chest: symmetric with normal excursions and percussion. Breasts: Symmetric, without lumps, nipple discharge, retractions, or fibrocystic changes.  Abdomen: Flat, soft with bowel sounds active. Nontender, no guarding, rebound, hernias, masses, or organomegaly.  Lymphatics: Non tender without lymphadenopathy.  Musculoskeletal: Full ROM all peripheral extremities, joint stability, 5/5 strength, and normal gait. Skin: Warm and dry without rashes, lesions, cyanosis, clubbing or  ecchymosis.  Neuro: Cranial nerves  intact, reflexes equal bilaterally. Normal muscle tone, no cerebellar symptoms. Sensation intact.  Pysch: Alert and oriented X 3, normal affect, Insight and Judgment appropriate.    Assessment and Plan  1. Annual Preventative Screening Examination   2. Essential hypertension  - EKG 12-Lead - Urinalysis, Routine w reflex microscopic - Microalbumin / creatinine urine ratio - CBC with Differential/Platelet - COMPLETE METABOLIC PANEL WITH GFR - Magnesium - TSH   3. Hyperlipidemia, mixed  - EKG 12-Lead - Lipid panel - TSH   4. Abnormal glucose  - EKG 12-Lead - Hemoglobin A1c - Insulin, random   5. Vitamin D deficiency  - VITAMIN D 25 Hydroxy   6. Osteopenia, unspecified location  - TSH   7. Screening for colorectal cancer  - POC Hemoccult Bld/Stl   8. Screening for heart disease  - EKG 12-Lead   9. FH: hypertension  - EKG 12-Lead   10. Medication management  - Urinalysis, Routine w reflex microscopic - Microalbumin / creatinine urine ratio - CBC with Differential/Platelet - COMPLETE METABOLIC PANEL WITH GFR - Magnesium - Lipid panel - TSH - Hemoglobin A1c - Insulin, random - VITAMIN D 25 Hydroxy            Patient was counseled in prudent diet to achieve/maintain BMI less than 25 for weight control, BP monitoring, regular exercise and medications.  Rx Maxitrol for Blepharitis.   Discussed med's effects and SE's. Screening labs and tests as requested with regular follow-up as recommended. Over 40 minutes of exam, counseling, chart review and high complex critical decision making was performed.   Marinus Maw, MD

## 2023-05-21 ENCOUNTER — Encounter: Payer: Self-pay | Admitting: Internal Medicine

## 2023-05-21 ENCOUNTER — Ambulatory Visit (INDEPENDENT_AMBULATORY_CARE_PROVIDER_SITE_OTHER): Payer: PPO | Admitting: Internal Medicine

## 2023-05-21 VITALS — BP 110/70 | HR 70 | Temp 98.0°F | Resp 16 | Ht 61.0 in | Wt 124.6 lb

## 2023-05-21 DIAGNOSIS — Z79899 Other long term (current) drug therapy: Secondary | ICD-10-CM | POA: Diagnosis not present

## 2023-05-21 DIAGNOSIS — E782 Mixed hyperlipidemia: Secondary | ICD-10-CM

## 2023-05-21 DIAGNOSIS — Z0000 Encounter for general adult medical examination without abnormal findings: Secondary | ICD-10-CM

## 2023-05-21 DIAGNOSIS — I10 Essential (primary) hypertension: Secondary | ICD-10-CM

## 2023-05-21 DIAGNOSIS — Z8249 Family history of ischemic heart disease and other diseases of the circulatory system: Secondary | ICD-10-CM

## 2023-05-21 DIAGNOSIS — M8580 Other specified disorders of bone density and structure, unspecified site: Secondary | ICD-10-CM | POA: Diagnosis not present

## 2023-05-21 DIAGNOSIS — R7309 Other abnormal glucose: Secondary | ICD-10-CM | POA: Diagnosis not present

## 2023-05-21 DIAGNOSIS — Z1211 Encounter for screening for malignant neoplasm of colon: Secondary | ICD-10-CM

## 2023-05-21 DIAGNOSIS — Z136 Encounter for screening for cardiovascular disorders: Secondary | ICD-10-CM

## 2023-05-21 DIAGNOSIS — Z0001 Encounter for general adult medical examination with abnormal findings: Secondary | ICD-10-CM

## 2023-05-21 DIAGNOSIS — E559 Vitamin D deficiency, unspecified: Secondary | ICD-10-CM | POA: Diagnosis not present

## 2023-05-22 LAB — LIPID PANEL
Cholesterol: 197 mg/dL (ref <200)
HDL: 99 mg/dL (ref >=50)
LDL Cholesterol (Calc): 83 mg/dL
Non-HDL Cholesterol (Calc): 98 mg/dL (ref <130)
Total CHOL/HDL Ratio: 2 (calc) (ref <5.0)
Triglycerides: 73 mg/dL (ref <150)

## 2023-05-22 LAB — COMPLETE METABOLIC PANEL WITH GFR
AG Ratio: 1.8 (calc) (ref 1.0–2.5)
ALT: 24 U/L (ref 6–29)
AST: 24 U/L (ref 10–35)
Albumin: 4.4 g/dL (ref 3.6–5.1)
Alkaline phosphatase (APISO): 135 U/L (ref 37–153)
BUN: 19 mg/dL (ref 7–25)
CO2: 31 mmol/L (ref 20–32)
Calcium: 10.1 mg/dL (ref 8.6–10.4)
Chloride: 104 mmol/L (ref 98–110)
Creat: 0.96 mg/dL (ref 0.60–1.00)
Globulin: 2.4 g/dL (ref 1.9–3.7)
Glucose, Bld: 77 mg/dL (ref 65–99)
Potassium: 4.4 mmol/L (ref 3.5–5.3)
Sodium: 142 mmol/L (ref 135–146)
Total Bilirubin: 0.5 mg/dL (ref 0.2–1.2)
Total Protein: 6.8 g/dL (ref 6.1–8.1)
eGFR: 63 mL/min/{1.73_m2} (ref >=60)

## 2023-05-22 LAB — URINALYSIS, ROUTINE W REFLEX MICROSCOPIC
Bilirubin Urine: NEGATIVE
Glucose, UA: NEGATIVE
Hgb urine dipstick: NEGATIVE
Ketones, ur: NEGATIVE
Leukocytes,Ua: NEGATIVE
Nitrite: NEGATIVE
Protein, ur: NEGATIVE
Specific Gravity, Urine: 1.004 (ref 1.001–1.035)
pH: 5.5 (ref 5.0–8.0)

## 2023-05-22 LAB — CBC WITH DIFFERENTIAL/PLATELET
Absolute Lymphocytes: 2726 {cells}/uL (ref 850–3900)
Absolute Monocytes: 648 {cells}/uL (ref 200–950)
Basophils Absolute: 47 {cells}/uL (ref 0–200)
Basophils Relative: 0.6 %
Eosinophils Absolute: 166 {cells}/uL (ref 15–500)
Eosinophils Relative: 2.1 %
HCT: 41.7 % (ref 35.0–45.0)
Hemoglobin: 13.8 g/dL (ref 11.7–15.5)
MCH: 31.7 pg (ref 27.0–33.0)
MCHC: 33.1 g/dL (ref 32.0–36.0)
MCV: 95.6 fL (ref 80.0–100.0)
MPV: 11 fL (ref 7.5–12.5)
Monocytes Relative: 8.2 %
Neutro Abs: 4313 {cells}/uL (ref 1500–7800)
Neutrophils Relative %: 54.6 %
Platelets: 270 10*3/uL (ref 140–400)
RBC: 4.36 10*6/uL (ref 3.80–5.10)
RDW: 12 % (ref 11.0–15.0)
Total Lymphocyte: 34.5 %
WBC: 7.9 10*3/uL (ref 3.8–10.8)

## 2023-05-22 LAB — HEMOGLOBIN A1C
Hgb A1c MFr Bld: 5.6 %{Hb} (ref <5.7)
Mean Plasma Glucose: 114 mg/dL
eAG (mmol/L): 6.3 mmol/L

## 2023-05-22 LAB — INSULIN, RANDOM: Insulin: 3.1 u[IU]/mL

## 2023-05-22 LAB — TSH: TSH: 1.62 m[IU]/L (ref 0.40–4.50)

## 2023-05-22 LAB — MICROALBUMIN / CREATININE URINE RATIO
Creatinine, Urine: 16 mg/dL — ABNORMAL LOW (ref 20–275)
Microalb, Ur: <0.2 mg/dL

## 2023-05-22 LAB — MAGNESIUM: Magnesium: 2.4 mg/dL (ref 1.5–2.5)

## 2023-05-22 LAB — VITAMIN D 25 HYDROXY (VIT D DEFICIENCY, FRACTURES): Vit D, 25-Hydroxy: 59 ng/mL (ref 30–100)

## 2023-05-22 NOTE — Progress Notes (Signed)
[][][][][][][][][][][][][][][][][][][][][][][][][][][][][][][][][][][][][][][][][]][][][][][][][][][][][][][][][][][][][][][][][[][][][][]  -  Test results slightly outside the reference range are not unusual. If there is anything important, I will review this with you,  otherwise it is considered normal test values.  If you have further questions,  please do not hesitate to contact me at the office or via My Chart.   [] [] [] [] [] [] [] [] [] [] [] [] [] [] [] [] [] [] [] [] [] [] [] [] [] [] [] [] [] [] [] [] [] [] [] [] [] [] [] [] [] ][] [] [] [] [] [] [] [] [] [] [] [] [] [] [] [] [] [] [] [] [] [] [[] [] [] [] []   -   Chol = 197   &  LDL = 83   -   Both  Excellent   - Very low risk for Heart Attack  / Stroke  [] [] [] [] [] [] [] [] [] [] [] [] [] [] [] [] [] [] [] [] [] [] [] [] [] [] [] [] [] [] [] [] [] [] [] [] [] [] [] [] [] ][] [] [] [] [] [] [] [] [] [] [] [] [] [] [] [] [] [] [] [] [] [] [[] [] [] [] []   -   A1c = 5.6% - Great !    Back in Normal Non PreDiabetic Range   [] [] [] [] [] [] [] [] [] [] [] [] [] [] [] [] [] [] [] [] [] [] [] [] [] [] [] [] [] [] [] [] [] [] [] [] [] [] [] [] [] ][] [] [] [] [] [] [] [] [] [] [] [] [] [] [] [] [] [] [] [] [] [] [[] [] [] [] []   -  Vitamin D = 59 - Great  !   Please keep dose Same   [] [] [] [] [] [] [] [] [] [] [] [] [] [] [] [] [] [] [] [] [] [] [] [] [] [] [] [] [] [] [] [] [] [] [] [] [] [] [] [] [] ][] [] [] [] [] [] [] [] [] [] [] [] [] [] [] [] [] [] [] [] [] [] [[] [] [] [] []   -  All Else - CBC - Kidneys - Electrolytes - Liver - Magnesium & Thyroid    - all  Normal / OK  [] [] [] [] [] [] [] [] [] [] [] [] [] [] [] [] [] [] [] [] [] [] [] [] [] [] [] [] [] [] [] [] [] [] [] [] [] [] [] [] [] ][] [] [] [] [] [] [] [] [] [] [] [] [] [] [] [] [] [] [] [] [] [] [[] [] [] [] []   -  Keep up the [place] Work  !   [] [] [] [] [] [] [] [] [] [] [] [] [] [] [] [] [] [] [] [] [] [] [] [] [] [] [] [] [] [] [] [] [] [] [] [] [] [] [] [] [] ][] [] [] [] [] [] [] [] [] [] [] [] [] [] [] [] [] [] [] [] [] [] [[] [] [] [] []

## 2023-08-01 DIAGNOSIS — L570 Actinic keratosis: Secondary | ICD-10-CM | POA: Diagnosis not present

## 2023-08-01 DIAGNOSIS — L578 Other skin changes due to chronic exposure to nonionizing radiation: Secondary | ICD-10-CM | POA: Diagnosis not present

## 2023-08-01 DIAGNOSIS — L814 Other melanin hyperpigmentation: Secondary | ICD-10-CM | POA: Diagnosis not present

## 2023-08-01 DIAGNOSIS — L821 Other seborrheic keratosis: Secondary | ICD-10-CM | POA: Diagnosis not present

## 2023-08-01 DIAGNOSIS — Z09 Encounter for follow-up examination after completed treatment for conditions other than malignant neoplasm: Secondary | ICD-10-CM | POA: Diagnosis not present

## 2023-08-29 ENCOUNTER — Ambulatory Visit: Payer: PPO | Admitting: Nurse Practitioner

## 2023-10-02 ENCOUNTER — Encounter: Payer: Self-pay | Admitting: Family Medicine

## 2023-10-02 ENCOUNTER — Ambulatory Visit (INDEPENDENT_AMBULATORY_CARE_PROVIDER_SITE_OTHER): Payer: PPO | Admitting: Family Medicine

## 2023-10-02 VITALS — BP 104/80 | HR 70 | Temp 98.2°F | Ht 60.0 in | Wt 127.3 lb

## 2023-10-02 DIAGNOSIS — J302 Other seasonal allergic rhinitis: Secondary | ICD-10-CM

## 2023-10-02 DIAGNOSIS — E782 Mixed hyperlipidemia: Secondary | ICD-10-CM | POA: Diagnosis not present

## 2023-10-02 DIAGNOSIS — I10 Essential (primary) hypertension: Secondary | ICD-10-CM

## 2023-10-02 DIAGNOSIS — Z1231 Encounter for screening mammogram for malignant neoplasm of breast: Secondary | ICD-10-CM | POA: Diagnosis not present

## 2023-10-02 MED ORDER — ATORVASTATIN CALCIUM 40 MG PO TABS: ORAL_TABLET | ORAL | 1 refills | Status: DC

## 2023-10-02 MED ORDER — MONTELUKAST SODIUM 10 MG PO TABS: ORAL_TABLET | ORAL | 1 refills | Status: AC

## 2023-10-02 MED ORDER — AMLODIPINE BESYLATE 2.5 MG PO TABS: ORAL_TABLET | ORAL | 1 refills | Status: DC

## 2023-10-02 MED ORDER — AZELASTINE HCL 0.1 % NA SOLN: NASAL | 3 refills | Status: AC

## 2023-10-02 NOTE — Progress Notes (Signed)
 New Patient Office Visit  Subjective   Patient ID: Alicia Gates, female    DOB: Jan 17, 1951  Age: 73 y.o. MRN: [id number]  CC:  Chief Complaint  Patient presents with   Establish Care    HPI Alicia Gates presents to establish care Was a previous patient of Dr. Cassondra Cliff, he recently passed away. She reports she has a history of HTN, HLD, prediabetes and vitamin D  deficiency.   Pt also has a history of cervical stenosis and sees [facility] for her chronic neck pain. Has appt with them next week. Also has lower back problems, Dr. Rexanne Catalina did lumbar injections several years ago.   Was diagnosed with Raynaud's disease in 2010 and she was placed on amlodipine  2.5 mg daily at the time. She had a full cardiac work up which was normal per her report.    Pt reports she has been getting UTI's more often in the past year or so. States that they thought that she might be getting IC? States that she will have isolated days when she thinks her symptoms are returning, then it will go away. Pt reports that she did undergo pelvic floor therapy about 10 years ago. States she feels like more of a pressure, no real stinging or burning, just pain at the end of urination. We discussed various methods of treatment, including referral vs PT vs medications.   I have reviewed all aspects of the patient's medical history including social, family, and surgical history.  Current Outpatient Medications  Medication Instructions   amLODipine  (NORVASC ) 2.5 MG tablet Take  1 tablet  Daily   ascorbic acid (VITAMIN C) 500 mg, Daily   atorvastatin  (LIPITOR) 40 MG tablet Take  1 tablet  2 x /week for Cholesterol   azelastine  (ASTELIN ) 0.1 % nasal spray Use 1 to 2 sprays each nostril 1 to 2 x /day as Needed   BABY ASPIRIN PO 81 mg, Daily   Calcium  Carbonate-Vitamin D  (CALCIUM  + D PO) 600 mg, Daily   Cetirizine HCl (ZYRTEC ALLERGY PO) Take by mouth.   Cholecalciferol (VITAMIN D  PO) 4,000 Int'l Units, 2 times daily    CINNAMON PO 1,000 mg, Daily   ferrous sulfate  325 (65 FE) MG tablet Takes 1 tablet Daily   montelukast  (SINGULAIR ) 10 MG tablet Take  1 tablet   Daily  for Allergies   Multiple Vitamins-Minerals (MULTIVITAMIN PO) Take by mouth. With calcium    triamcinolone  cream (KENALOG ) 0.1 % APPLY TO AFFECTED AREA TWICE A DAY AS NEEDED   Zinc 50 MG CAPS Daily    Past Medical History:  Diagnosis Date   Allergy    DDD (degenerative disc disease), lumbar    Depression    Hyperlipidemia    Hypertension    Osteopenia    Personal history of squamous cell carcinoma of skin 06/03/2019   Raynaud disease 2010    Past Surgical History:  Procedure Laterality Date   ABDOMINAL HYSTERECTOMY     CATARACT EXTRACTION Right 07/05/2020   Dr. Demetrios Finders   CATARACT EXTRACTION Left 07/26/2020   Dr. Demetrios Finders   RHINOPLASTY      Family History  Problem Relation Age of Onset   Diabetes Mother    Hypertension Mother    Arthritis Mother    Stroke Mother        Dec 2016   Arthritis Father        PMR   Cancer Father        colon   Hypertension Father  Macular degeneration Father    Stroke Father        due to TA    Social History   Socioeconomic History   Marital status: Married    Spouse name: Not on file   Number of children: Not on file   Years of education: Not on file   Highest education level: Bachelor's degree (e.g., BA, AB, BS)  Occupational History   Not on file  Tobacco Use   Smoking status: Never   Smokeless tobacco: Never  Substance and Sexual Activity   Alcohol use: Yes    Comment: Rare   Drug use: No   Sexual activity: Not on file  Other Topics Concern   Not on file  Social History Narrative   Not on file   Social Drivers of Health   Financial Resource Strain: Low Risk  (09/28/2023)   Overall Financial Resource Strain (CARDIA)    Difficulty of Paying Living Expenses: Not hard at all  Food Insecurity: No Food Insecurity (09/28/2023)   Hunger Vital Sign    Worried About Running  Out of Food in the Last Year: Never true    Ran Out of Food in the Last Year: Never true  Transportation Needs: No Transportation Needs (09/28/2023)   PRAPARE - Administrator, Civil Service (Medical): No    Lack of Transportation (Non-Medical): No  Physical Activity: Insufficiently Active (09/28/2023)   Exercise Vital Sign    Days of Exercise per Week: 3 days    Minutes of Exercise per Session: 30 min  Stress: No Stress Concern Present (09/28/2023)   [employer] of Occupational Health - Occupational Stress Questionnaire    Feeling of Stress : Not at all  Social Connections: Socially Integrated (09/28/2023)   Social Connection and Isolation Panel [NHANES]    Frequency of Communication with Friends and Family: More than three times a week    Frequency of Social Gatherings with Friends and Family: More than three times a week    Attends Religious Services: More than 4 times per year    Active Member of [employer] or Organizations: Yes    Attends Engineer, structural: More than 4 times per year    Marital Status: Married  Catering manager Violence: Not on file    Review of Systems  All other systems reviewed and are negative.       Objective   BP 104/80   Pulse 70   Temp 98.2 F (36.8 C) (Oral)   Ht 5' (1.524 m)   Wt 127 lb 4.8 oz (57.7 kg)   SpO2 100%   BMI 24.86 kg/m   Physical Exam Vitals reviewed.  Constitutional:      Appearance: Normal appearance. She is well-groomed and normal weight.  Eyes:     Conjunctiva/sclera: Conjunctivae normal.  Neck:     Thyroid : No thyromegaly.  Cardiovascular:     Rate and Rhythm: Normal rate and regular rhythm.     Heart sounds: S1 normal and S2 normal.  Pulmonary:     Effort: Pulmonary effort is normal.     Breath sounds: Normal breath sounds and air entry.  Musculoskeletal:     Right lower leg: No edema.     Left lower leg: No edema.  Neurological:     Mental Status: She is alert and oriented to person,  place, and time. Mental status is at baseline.     Gait: Gait is intact.  Psychiatric:  Mood and Affect: Mood and affect normal.        Speech: Speech normal.        Behavior: Behavior normal.        Judgment: Judgment normal.        The 10-year ASCVD risk score (Arnett DK, et al., 2019) is: 9.8%   Values used to calculate the score:     Age: 69 years     Sex: Female     Is Non-Hispanic African American: No     Diabetic: No     Tobacco smoker: No     Systolic Blood Pressure: 104 mmHg     Is BP treated: Yes     HDL Cholesterol: 99 mg/dL     Total Cholesterol: 197 mg/dL  Assessment & Plan:  Essential hypertension Assessment & Plan: Chronic, stable, BP controlled with 2.5 mg amlodipine  daily. Will continue as prescribed.   Orders: -     amLODIPine  Besylate; Take  1 tablet  Daily  Dispense: 90 tablet; Refill: 1  Hyperlipidemia, mixed Assessment & Plan: Reviewed las set of labs, controlled on atorvastatin , will continue as prescribed.   Orders: -     Atorvastatin  Calcium ; Take  1 tablet  2 x /week for Cholesterol  Dispense: 26 tablet; Refill: 1  Seasonal allergic rhinitis, unspecified trigger Assessment & Plan: Chronic, well controlled on medications, will continue nasal spray and montelukast .   Orders: -     Azelastine  HCl; Use 1 to 2 sprays each nostril 1 to 2 x /day as Needed  Dispense: 90 mL; Refill: 3 -     Montelukast  Sodium; Take  1 tablet   Daily  for Allergies  Dispense: 90 tablet; Refill: 1  Breast cancer screening by mammogram -     3D Screening Mammogram, Left and Right; Future    Return in about 8 months (around 05/21/2024) for annual physical exam, pt also needs her annual wellness visit  with [facility] over the phone.   Aida House, MD

## 2023-10-05 ENCOUNTER — Ambulatory Visit
Admission: RE | Admit: 2023-10-05 | Discharge: 2023-10-05 | Disposition: A | Discharge status: Home / Self Care | Source: Ambulatory Visit | Attending: Family Medicine | Admitting: Family Medicine

## 2023-10-05 DIAGNOSIS — Z1231 Encounter for screening mammogram for malignant neoplasm of breast: Secondary | ICD-10-CM

## 2023-10-05 NOTE — Assessment & Plan Note (Signed)
 Chronic, stable, BP controlled with 2.5 mg amlodipine  daily. Will continue as prescribed.

## 2023-10-05 NOTE — Assessment & Plan Note (Signed)
 Reviewed las set of labs, controlled on atorvastatin , will continue as prescribed.

## 2023-10-05 NOTE — Assessment & Plan Note (Signed)
 Chronic, well controlled on medications, will continue nasal spray and montelukast .

## 2023-10-10 ENCOUNTER — Other Ambulatory Visit: Payer: Self-pay | Admitting: Family Medicine

## 2023-10-10 DIAGNOSIS — R928 Other abnormal and inconclusive findings on diagnostic imaging of breast: Secondary | ICD-10-CM

## 2023-10-11 ENCOUNTER — Encounter: Payer: Self-pay | Admitting: Family Medicine

## 2023-10-24 ENCOUNTER — Ambulatory Visit

## 2023-10-24 ENCOUNTER — Ambulatory Visit: Payer: Self-pay | Admitting: Family Medicine

## 2023-10-24 ENCOUNTER — Ambulatory Visit
Admission: RE | Admit: 2023-10-24 | Discharge: 2023-10-24 | Disposition: A | Discharge status: Home / Self Care | Source: Ambulatory Visit | Attending: Family Medicine | Admitting: Family Medicine

## 2023-10-24 DIAGNOSIS — R928 Other abnormal and inconclusive findings on diagnostic imaging of breast: Secondary | ICD-10-CM

## 2023-10-24 DIAGNOSIS — R922 Inconclusive mammogram: Secondary | ICD-10-CM | POA: Diagnosis not present

## 2023-10-24 DIAGNOSIS — R92322 Mammographic fibroglandular density, left breast: Secondary | ICD-10-CM | POA: Diagnosis not present

## 2023-11-20 ENCOUNTER — Ambulatory Visit: Payer: PPO | Admitting: Internal Medicine

## 2024-02-07 ENCOUNTER — Other Ambulatory Visit: Payer: Self-pay | Admitting: Family Medicine

## 2024-02-07 DIAGNOSIS — E782 Mixed hyperlipidemia: Secondary | ICD-10-CM

## 2024-02-20 ENCOUNTER — Ambulatory Visit: Payer: PPO | Admitting: Nurse Practitioner

## 2024-02-21 ENCOUNTER — Ambulatory Visit: Admitting: Family Medicine

## 2024-02-21 ENCOUNTER — Encounter: Payer: Self-pay | Admitting: Family Medicine

## 2024-02-21 DIAGNOSIS — Z0000 Encounter for general adult medical examination without abnormal findings: Secondary | ICD-10-CM

## 2024-02-21 DIAGNOSIS — I10 Essential (primary) hypertension: Secondary | ICD-10-CM | POA: Diagnosis not present

## 2024-02-21 MED ORDER — AMLODIPINE BESYLATE 2.5 MG PO TABS: ORAL_TABLET | ORAL | 1 refills | Status: AC

## 2024-02-21 NOTE — Progress Notes (Signed)
 Patient unable to perform vital signs for phone visit.

## 2024-02-21 NOTE — Progress Notes (Signed)
 Subjective:   Alicia Gates is a 73 y.o. female who presents for Medicare Annual (Subsequent) preventive examination.  Visit Complete: Virtual I connected with  Alicia Gates on 02/21/24 by a audio enabled telemedicine application and verified that I am speaking with the correct person using two identifiers.  Patient Location: Home  Provider Location: Office/Clinic  I discussed the limitations of evaluation and management by telemedicine. The patient expressed understanding and agreed to proceed.  Vital Signs: Because this visit was a virtual/telehealth visit, some criteria may be missing or patient reported. Any vitals not documented were not able to be obtained and vitals that have been documented are patient reported.  Patient Medicare AWV questionnaire was completed by the patient on 02/19/2024; I have confirmed that all information answered by patient is correct and no changes since this date.        Objective:    There were no vitals filed for this visit. There is no height or weight on file to calculate BMI.     02/21/2024    3:36 PM 08/29/2022    9:31 AM 08/25/2021    9:30 AM 08/04/2020    9:15 AM 06/03/2020    4:41 PM 06/03/2019   11:36 AM 05/24/2018   12:07 PM  Advanced Directives  Does Patient Have a Medical Advance Directive? Yes Yes Yes Yes Yes Yes Yes   Type of Estate agent of [place];Living will Healthcare Power of [place];Living will Healthcare Power of [place];Living will Healthcare Power of [place];Living will Healthcare Power of [place];Living will Healthcare Power of [place];Living will Healthcare Power of [place];Living will  Does patient want to make changes to medical advance directive? No - Patient declined No - Patient declined No - Patient declined No - Patient declined No - Patient declined No - Patient declined No - Patient declined   Copy of Healthcare Power of Attorney in Chart? No - copy requested No - copy requested No  - copy requested No - copy requested No - copy requested No - copy requested No - copy requested      Data saved with a previous flowsheet row definition    Current Medications (verified) Outpatient Encounter Medications as of 02/21/2024  Medication Sig   atorvastatin  (LIPITOR) 40 MG tablet TAKE ONE TABLET BY MOUTH TWO TIMES PER WEEK FOR CHOLESTEROL   azelastine  (ASTELIN ) 0.1 % nasal spray Use 1 to 2 sprays each nostril 1 to 2 x /day as Needed   BABY ASPIRIN PO Take 81 mg by mouth daily.   Calcium  Carbonate-Vitamin D  (CALCIUM  + D PO) Take 600 mg by mouth daily.    Cetirizine HCl (ZYRTEC ALLERGY PO) Take by mouth.   Cholecalciferol (VITAMIN D  PO) Take 4,000 Int'l Units by mouth 2 (two) times daily.    CINNAMON PO Take 1,000 mg by mouth daily.   ferrous sulfate  325 (65 FE) MG tablet Takes 1 tablet Daily   montelukast  (SINGULAIR ) 10 MG tablet Take  1 tablet   Daily  for Allergies   Multiple Vitamins-Minerals (MULTIVITAMIN PO) Take by mouth. With calcium    triamcinolone  cream (KENALOG ) 0.1 % APPLY TO AFFECTED AREA TWICE A DAY AS NEEDED   vitamin C (ASCORBIC ACID) 500 MG tablet Take 500 mg by mouth daily.   Zinc 50 MG CAPS Take by mouth daily. Every other day   [DISCONTINUED] amLODipine  (NORVASC ) 2.5 MG tablet Take  1 tablet  Daily   amLODipine  (NORVASC ) 2.5 MG tablet Take  1 tablet  Daily  No facility-administered encounter medications on file as of 02/21/2024.    Allergies (verified) Codeine   History: Past Medical History:  Diagnosis Date   Allergy    DDD (degenerative disc disease), lumbar    Depression    Hyperlipidemia    Hypertension    Osteopenia    Personal history of squamous cell carcinoma of skin 06/03/2019   Raynaud disease 2010   Past Surgical History:  Procedure Laterality Date   ABDOMINAL HYSTERECTOMY     CATARACT EXTRACTION Right 07/05/2020   Dr. Milan   CATARACT EXTRACTION Left 07/26/2020   Dr. Milan   RHINOPLASTY     Family History  Problem Relation  Age of Onset   Diabetes Mother    Hypertension Mother    Arthritis Mother    Stroke Mother        Dec 2016   Arthritis Father        PMR   Cancer Father        colon   Hypertension Father    Macular degeneration Father    Stroke Father        due to TA   Social History   Socioeconomic History   Marital status: Married    Spouse name: Not on file   Number of children: Not on file   Years of education: Not on file   Highest education level: Bachelor's degree (e.g., BA, AB, BS)  Occupational History   Not on file  Tobacco Use   Smoking status: Never   Smokeless tobacco: Never  Substance and Sexual Activity   Alcohol use: Yes    Comment: Rare   Drug use: No   Sexual activity: Not on file  Other Topics Concern   Not on file  Social History Narrative   Not on file   Social Drivers of Health   Financial Resource Strain: Low Risk  (02/19/2024)   Overall Financial Resource Strain (CARDIA)    Difficulty of Paying Living Expenses: Not hard at all  Food Insecurity: No Food Insecurity (02/19/2024)   Hunger Vital Sign    Worried About Running Out of Food in the Last Year: Never true    Ran Out of Food in the Last Year: Never true  Transportation Needs: No Transportation Needs (02/19/2024)   PRAPARE - Administrator, Civil Service (Medical): No    Lack of Transportation (Non-Medical): No  Physical Activity: Insufficiently Active (02/19/2024)   Exercise Vital Sign    Days of Exercise per Week: 1 day    Minutes of Exercise per Session: 10 min  Stress: No Stress Concern Present (02/19/2024)   [employer] of Occupational Health - Occupational Stress Questionnaire    Feeling of Stress: Only a little  Social Connections: Socially Integrated (02/19/2024)   Social Connection and Isolation Panel    Frequency of Communication with Friends and Family: More than three times a week    Frequency of Social Gatherings with Friends and Family: More than three times a week     Attends Religious Services: More than 4 times per year    Active Member of [employer] or Organizations: Yes    Attends Engineer, structural: More than 4 times per year    Marital Status: Married    Tobacco Counseling Counseling given: No Pt is not a smoker  Clinical Intake:  Pre-visit preparation completed: No  Pain : No/denies pain     BMI - recorded: 24 Nutritional Status: BMI of 19-24  Normal  Nutritional Risks: None Diabetes: No  How often do you need to have someone help you when you read instructions, pamphlets, or other written materials from your doctor or pharmacy?: 1 - Never  Interpreter Needed?: No      Activities of Daily Living    02/19/2024    3:41 PM 05/20/2023   11:05 PM  In your present state of health, do you have any difficulty performing the following activities:  Hearing? 0 0  Vision? 0 0  Difficulty concentrating or making decisions? 0 0  Walking or climbing stairs? 0 0  Dressing or bathing? 0 0  Doing errands, shopping? 0 0  Preparing Food and eating ? N   Using the Toilet? N   In the past six months, have you accidently leaked urine? Y   Do you have problems with loss of bowel control? N   Managing your Medications? N   Managing your Finances? N   Housekeeping or managing your Housekeeping? N     Patient Care Team: Ozell Heron HERO, MD as PCP - General (Family Medicine) Bonner Ade, MD as Consulting Physician (Physical Medicine and Rehabilitation) Perla Evalene PARAS, MD as Consulting Physician (Cardiology) Donnald Charleston, MD as Consulting Physician (Gastroenterology) Ivin Kocher, MD as Consulting Physician (Dermatology)  Indicate any recent Medical Services you may have received from other than [facility] providers in the past year (date may be approximate).     Assessment:   This is a routine wellness examination for Lella.  Hearing/Vision screen No results found.   Goals Addressed             This Visit's  Progress    Activity and Exercise Increased       Evidence-based guidance:  Review current exercise levels.  Assess patient perspective on exercise or activity level, barriers to increasing activity, motivation and readiness for change.  Recommend or set healthy exercise goal based on individual tolerance.  Encourage small steps toward making change in amount of exercise or activity.  Urge reduction of sedentary activities or screen time.  Promote group activities within the community or with family or support person.  Consider referral to rehabiliation therapist for assessment and exercise/activity plan.   Notes:        Depression Screen    02/21/2024    3:11 PM 10/02/2023    1:02 PM 05/20/2023   11:05 PM 11/29/2022    8:24 PM 08/29/2022    9:32 AM 05/20/2022   10:20 PM 12/07/2021    9:46 PM  PHQ 2/9 Scores  PHQ - 2 Score 0 0 0 0 0 0 0  PHQ- 9 Score 0 0         Fall Risk    02/21/2024    3:10 PM 02/19/2024    3:41 PM 10/02/2023    1:03 PM 05/20/2023   11:05 PM 11/29/2022    8:24 PM  Fall Risk   Falls in the past year? 0 0 0 0 0  Number falls in past yr: 0 0 0    Injury with Fall? 0 0 0    Risk for fall due to : No Fall Risks  No Fall Risks No Fall Risks No Fall Risks  Follow up Falls evaluation completed  Falls evaluation completed Falls prevention discussed;Education provided;Falls evaluation completed Falls evaluation completed;Education provided;Falls prevention discussed    MEDICARE RISK AT HOME: Medicare Risk at Home Any stairs in or around the home?: (Patient-Rptd) Yes If so, are there any without handrails?: (  Patient-Rptd) No Home free of loose throw rugs in walkways, pet beds, electrical cords, etc?: (Patient-Rptd) Yes Adequate lighting in your home to reduce risk of falls?: (Patient-Rptd) Yes Life alert?: (Patient-Rptd) No Use of a cane, walker or w/c?: (Patient-Rptd) No Grab bars in the bathroom?: (Patient-Rptd) No Shower chair or bench in shower?:  (Patient-Rptd) Yes Elevated toilet seat or a handicapped toilet?: (Patient-Rptd) No  TIMED UP AND GO:  Was the test performed?  No    Cognitive Function:    06/03/2020    4:51 PM  MMSE - Mini Mental State Exam  Orientation to time 5  Orientation to Place 5  Registration 3  Attention/ Calculation 5  Recall 3  Language- name 2 objects 2  Language- repeat 1  Language- follow 3 step command 3  Language- read & follow direction 1  Write a sentence 1  Copy design 1  Total score 30        02/21/2024    3:43 PM  6CIT Screen  What Year? 0 points  What month? 0 points  What time? 0 points  Count back from 20 0 points  Months in reverse 0 points  Repeat phrase 0 points  Total Score 0 points    Immunizations Immunization History  Administered Date(s) Administered   Fluad Quad(high Dose 65+) 03/14/2021   INFLUENZA, HIGH DOSE SEASONAL PF 03/23/2022   Influenza Inj Mdck Quad Pf 03/04/2019   Influenza-Unspecified 03/14/2018, 02/27/2020   PFIZER(Purple Top)SARS-COV-2 Vaccination 07/03/2019, 07/25/2019, 03/29/2020, 11/10/2020   Pfizer Covid-19 Vaccine Bivalent Booster 70yrs & up 03/22/2021   Pneumococcal Conjugate-13 12/07/2016   Pneumococcal Polysaccharide-23 03/04/2019   Pneumococcal-Unspecified 02/13/2003   Tdap 09/29/2011   Varicella Zoster Immune Globulin 11/22/2015   Zoster Recombinant(Shingrix) 03/14/2018, 05/20/2018    TDAP status: Up to date  Flu Vaccine status: Declined, Education has been provided regarding the importance of this vaccine but patient still declined. Advised may receive this vaccine at local pharmacy or Health Dept. Aware to provide a copy of the vaccination record if obtained from local pharmacy or Health Dept. Verbalized acceptance and understanding.  Pneumococcal vaccine status: Up to date  Covid-19 vaccine status: Completed vaccines  Qualifies for Shingles Vaccine? Yes   Zostavax completed Yes   Shingrix Completed?: Yes  Screening  Tests Health Maintenance  Topic Date Due   Influenza Vaccine  01/04/2024   COVID-19 Vaccine (6 - 2025-26 season) 02/04/2024   DEXA SCAN  12/05/2024   Medicare Annual Wellness (AWV)  02/20/2025   Mammogram  10/04/2025   Colonoscopy  09/28/2026   Pneumococcal Vaccine: 50+ Years  Completed   Hepatitis C Screening  Completed   Zoster Vaccines- Shingrix  Completed   HPV VACCINES  Aged Out   Meningococcal B Vaccine  Aged Out   DTaP/Tdap/Td  Discontinued    Health Maintenance  Health Maintenance Due  Topic Date Due   Influenza Vaccine  01/04/2024   COVID-19 Vaccine (6 - 2025-26 season) 02/04/2024    Colorectal cancer screening: Type of screening: Colonoscopy. Completed 09/27/2021. Repeat every 5 years  Mammogram status: Completed 10/05/2023. Repeat every year  Bone Density status: Completed 12/06/2022. Results reflect: Bone density results: OSTEOPENIA. Repeat every 2 years.  Lung Cancer Screening: (Low Dose CT Chest recommended if Age 63-80 years, 20 pack-year currently smoking OR have quit w/in 15years.) does not qualify.   Lung Cancer Screening Referral: N/A  Additional Screening:  Hepatitis C Screening: does qualify; Completed 2017  Vision Screening: Recommended annual ophthalmology exams for early detection  of glaucoma and other disorders of the eye. Is the patient up to date with their annual eye exam?  Yes  Who is the provider or what is the name of the office in which the patient attends annual eye exams? [facility] If pt is not established with a provider, would they like to be referred to a provider to establish care? No .   Dental Screening: Recommended annual dental exams for proper oral hygiene  Diabetic Foot Exam: n/a  Community Resource Referral / Chronic Care Management: CRR required this visit?  No   CCM required this visit?  No     Plan:     I have personally reviewed and noted the following in the patient's chart:   Medical and social history Use of  alcohol, tobacco or illicit drugs  Current medications and supplements including opioid prescriptions. Patient is not currently taking opioid prescriptions. Functional ability and status Nutritional status Physical activity Advanced directives List of other physicians Hospitalizations, surgeries, and ER visits in previous 12 months Vitals Screenings to include cognitive, depression, and falls Referrals and appointments  In addition, I have reviewed and discussed with patient certain preventive protocols, quality metrics, and best practice recommendations. A written personalized care plan for preventive services as well as general preventive health recommendations were provided to patient.     Heron CHRISTELLA Sharper, MD   02/21/2024   After Visit Summary: (MyChart) Due to this being a telephonic visit, the after visit summary with patients personalized plan was offered to patient via MyChart   Nurse Notes: N/A

## 2024-04-09 DIAGNOSIS — L821 Other seborrheic keratosis: Secondary | ICD-10-CM | POA: Diagnosis not present

## 2024-04-09 DIAGNOSIS — D485 Neoplasm of uncertain behavior of skin: Secondary | ICD-10-CM | POA: Diagnosis not present

## 2024-04-09 DIAGNOSIS — L570 Actinic keratosis: Secondary | ICD-10-CM | POA: Diagnosis not present

## 2024-04-09 DIAGNOSIS — L814 Other melanin hyperpigmentation: Secondary | ICD-10-CM | POA: Diagnosis not present

## 2024-04-09 DIAGNOSIS — D1801 Hemangioma of skin and subcutaneous tissue: Secondary | ICD-10-CM | POA: Diagnosis not present

## 2024-04-09 DIAGNOSIS — D045 Carcinoma in situ of skin of trunk: Secondary | ICD-10-CM | POA: Diagnosis not present

## 2024-04-09 DIAGNOSIS — L820 Inflamed seborrheic keratosis: Secondary | ICD-10-CM | POA: Diagnosis not present

## 2024-04-09 DIAGNOSIS — L538 Other specified erythematous conditions: Secondary | ICD-10-CM | POA: Diagnosis not present

## 2024-06-10 ENCOUNTER — Encounter: Payer: PPO | Admitting: Internal Medicine

## 2024-06-10 ENCOUNTER — Ambulatory Visit (INDEPENDENT_AMBULATORY_CARE_PROVIDER_SITE_OTHER): Admitting: Family Medicine

## 2024-06-10 ENCOUNTER — Encounter: Payer: Self-pay | Admitting: Family Medicine

## 2024-06-10 VITALS — BP 118/88 | HR 63 | Temp 97.5°F | Ht 60.5 in | Wt 128.9 lb

## 2024-06-10 DIAGNOSIS — E782 Mixed hyperlipidemia: Secondary | ICD-10-CM | POA: Diagnosis not present

## 2024-06-10 DIAGNOSIS — Z131 Encounter for screening for diabetes mellitus: Secondary | ICD-10-CM

## 2024-06-10 DIAGNOSIS — Z0000 Encounter for general adult medical examination without abnormal findings: Secondary | ICD-10-CM | POA: Diagnosis not present

## 2024-06-10 DIAGNOSIS — I10 Essential (primary) hypertension: Secondary | ICD-10-CM | POA: Diagnosis not present

## 2024-06-10 LAB — LIPID PANEL
Cholesterol: 211 mg/dL — ABNORMAL HIGH (ref 28–200)
HDL: 102.5 mg/dL
LDL Cholesterol: 93 mg/dL (ref 10–99)
NonHDL: 108.08
Total CHOL/HDL Ratio: 2
Triglycerides: 77 mg/dL (ref 10.0–149.0)
VLDL: 15.4 mg/dL (ref 0.0–40.0)

## 2024-06-10 LAB — COMPREHENSIVE METABOLIC PANEL WITH GFR
ALT: 36 U/L — ABNORMAL HIGH (ref 3–35)
AST: 32 U/L (ref 5–37)
Albumin: 4.7 g/dL (ref 3.5–5.2)
Alkaline Phosphatase: 140 U/L — ABNORMAL HIGH (ref 39–117)
BUN: 17 mg/dL (ref 6–23)
CO2: 28 meq/L (ref 19–32)
Calcium: 10 mg/dL (ref 8.4–10.5)
Chloride: 104 meq/L (ref 96–112)
Creatinine, Ser: 0.87 mg/dL (ref 0.40–1.20)
GFR: 66.32 mL/min
Glucose, Bld: 96 mg/dL (ref 70–99)
Potassium: 4.5 meq/L (ref 3.5–5.1)
Sodium: 138 meq/L (ref 135–145)
Total Bilirubin: 0.5 mg/dL (ref 0.2–1.2)
Total Protein: 7.3 g/dL (ref 6.0–8.3)

## 2024-06-10 LAB — HEMOGLOBIN A1C: Hgb A1c MFr Bld: 5.8 % (ref 4.6–6.5)

## 2024-06-10 NOTE — Patient Instructions (Signed)
 Magnesium glycinate or some sort of blend or complex -- 100-200 mg at night at bedtime.

## 2024-06-10 NOTE — Progress Notes (Signed)
 "  Complete physical exam  Patient: Alicia Gates   DOB: Aug 16, 1950   74 y.o. Female  MRN: [id number]  Subjective:    Chief Complaint  Patient presents with   Medicare Wellness    Alicia Gates is a 74 y.o. female who presents today for a complete physical exam. She reports consuming a general diet. Fruits and veggies daily, eating protein, eats dairy, eggs, fish, chicken. States she cooks moderately well at home.   Home exercise routine includes walking 1-2 hrs per week. She generally feels well. She reports sleeping fairly well. She does not have additional problems to discuss today.    Most recent fall risk assessment:    06/10/2024   10:55 AM  Fall Risk   Falls in the past year? 0  Number falls in past yr: 0  Injury with Fall? 0  Risk for fall due to : No Fall Risks  Follow up Falls evaluation completed     Most recent depression screenings:    06/10/2024   10:59 AM 06/10/2024   10:58 AM  PHQ 2/9 Scores  PHQ - 2 Score 0 0  PHQ- 9 Score 1     Vision:Within last year and Dental: No current dental problems and Receives regular dental care  Patient Active Problem List   Diagnosis Date Noted   Seasonal allergic rhinitis 10/02/2023   Positive colorectal cancer screening using Cologuard test 08/25/2021   Anxiety 08/02/2020   Personal history of squamous cell carcinoma of skin 06/03/2019   Medication management 01/04/2017   Abnormal glucose 11/22/2015   Stress incontinence 11/13/2014   Hyperlipidemia, mixed    Essential hypertension    Depression, major, in remission (HCC)    Allergy    Osteopenia    Raynaud disease    DDD (degenerative disc disease), lumbar       Patient Care Team: Ozell Heron HERO, MD as PCP - General (Family Medicine) Bonner Ade, MD as Consulting Physician (Physical Medicine and Rehabilitation) Perla, Evalene PARAS, MD as Consulting Physician (Cardiology) Donnald Charleston, MD as Consulting Physician (Gastroenterology) Ivin Kocher,  MD as Consulting Physician (Dermatology)   Show/hide medication list[1]  Review of Systems  HENT:  Negative for hearing loss.   Eyes:  Negative for blurred vision.  Respiratory:  Negative for shortness of breath.   Cardiovascular:  Negative for chest pain.  Gastrointestinal: Negative.   Genitourinary: Negative.   Musculoskeletal:  Negative for back pain.  Neurological:  Negative for headaches.  Psychiatric/Behavioral:  Negative for depression.        Objective:     BP 118/88   Pulse 63   Temp (!) 97.5 F (36.4 C) (Oral)   Ht 5' 0.5 (1.537 m)   Wt 128 lb 14.4 oz (58.5 kg)   SpO2 96%   BMI 24.76 kg/m    Physical Exam Vitals reviewed.  Constitutional:      Appearance: Normal appearance. She is well-groomed and normal weight.  HENT:     Right Ear: Tympanic membrane and ear canal normal.     Left Ear: Tympanic membrane and ear canal normal.     Mouth/Throat:     Mouth: Mucous membranes are moist.     Pharynx: No posterior oropharyngeal erythema.  Eyes:     Conjunctiva/sclera: Conjunctivae normal.  Neck:     Thyroid : No thyromegaly.  Cardiovascular:     Rate and Rhythm: Normal rate and regular rhythm.     Pulses: Normal pulses.     Heart  sounds: S1 normal and S2 normal.  Pulmonary:     Effort: Pulmonary effort is normal.     Breath sounds: Normal breath sounds and air entry.  Abdominal:     General: Abdomen is flat. Bowel sounds are normal.     Palpations: Abdomen is soft.  Musculoskeletal:     Right lower leg: No edema.     Left lower leg: No edema.  Lymphadenopathy:     Cervical: No cervical adenopathy.  Neurological:     Mental Status: She is alert and oriented to person, place, and time. Mental status is at baseline.     Gait: Gait is intact.  Psychiatric:        Mood and Affect: Mood and affect normal.        Speech: Speech normal.        Behavior: Behavior normal.        Judgment: Judgment normal.      No results found for any visits on  06/10/24.     Assessment & Plan:    Routine Health Maintenance and Physical Exam  Immunization History  Administered Date(s) Administered   Fluad Quad(high Dose 65+) 03/14/2021, 02/17/2022   INFLUENZA, HIGH DOSE SEASONAL PF 03/23/2022   Influenza Inj Mdck Quad Pf 03/04/2019   Influenza-Unspecified 03/14/2018, 02/27/2020   PFIZER(Purple Top)SARS-COV-2 Vaccination 07/03/2019, 07/25/2019, 03/29/2020, 11/10/2020   Pfizer Covid-19 Vaccine Bivalent Booster 86yrs & up 03/22/2021   Pneumococcal Conjugate-13 12/07/2016   Pneumococcal Polysaccharide-23 03/04/2019   Pneumococcal-Unspecified 02/13/2003   Tdap 09/29/2011   Varicella Zoster Immune Globulin 11/22/2015   Zoster Recombinant(Shingrix) 03/14/2018, 05/20/2018    Health Maintenance  Topic Date Due   COVID-19 Vaccine (6 - 2025-26 season) 02/04/2024   Influenza Vaccine  09/02/2024 (Originally 01/04/2024)   Bone Density Scan  12/05/2024   Medicare Annual Wellness (AWV)  02/20/2025   Mammogram  10/04/2025   Colonoscopy  09/28/2026   Pneumococcal Vaccine: 50+ Years  Completed   Hepatitis C Screening  Completed   Zoster Vaccines- Shingrix  Completed   Meningococcal B Vaccine  Aged Out   DTaP/Tdap/Td  Discontinued    Discussed health benefits of physical activity, and encouraged her to engage in regular exercise appropriate for her age and condition.  Routine adult health maintenance  Essential hypertension -     Comprehensive metabolic panel with GFR; Future  Hyperlipidemia, mixed -     Lipid panel; Future  Diabetes mellitus screening -     Hemoglobin A1c; Future  General physical exam findings are normal today. I reviewed the patient's preventative testing, immunizations, and lifestyle habits. I made appropriate recommendations and placed orders for the appropriate tests and/or vaccinations. I counseled the patient on the CDC's recommendations for healthy exercise and diet. I counseled the patient on healthy sleep habits and  stress management. Handouts to reinforce the counseling were given at the conclusion of the visit.    Return in about 8 months (around 02/23/2025) for AWV -- with me in person.     Heron CHRISTELLA Sharper, MD      [1]  Outpatient Medications Prior to Visit  Medication Sig   amLODipine  (NORVASC ) 2.5 MG tablet Take  1 tablet  Daily   atorvastatin  (LIPITOR) 40 MG tablet TAKE ONE TABLET BY MOUTH TWO TIMES PER WEEK FOR CHOLESTEROL   azelastine  (ASTELIN ) 0.1 % nasal spray Use 1 to 2 sprays each nostril 1 to 2 x /day as Needed   BABY ASPIRIN PO Take 81 mg by mouth daily.  BIOTIN PO Take by mouth. 10,000mg -three times a week   Calcium  Carbonate-Vitamin D  (CALCIUM  + D PO) Take 600 mg by mouth daily.    Cetirizine HCl (ZYRTEC ALLERGY PO) Take by mouth.   Cholecalciferol (VITAMIN D  PO) Take 4,000 Int'l Units by mouth 2 (two) times daily.    CINNAMON PO Take 1,000 mg by mouth daily.   Ferrous Sulfate  (IRON PO) Take 65 mg by mouth daily.   ferrous sulfate  325 (65 FE) MG tablet Takes 1 tablet Daily   montelukast  (SINGULAIR ) 10 MG tablet Take  1 tablet   Daily  for Allergies   Multiple Vitamins-Minerals (MULTIVITAMIN PO) Take by mouth. With calcium    triamcinolone  cream (KENALOG ) 0.1 % APPLY TO AFFECTED AREA TWICE A DAY AS NEEDED   vitamin C (ASCORBIC ACID) 500 MG tablet Take 500 mg by mouth daily.   Zinc 50 MG CAPS Take by mouth daily. Every other day   No facility-administered medications prior to visit.   "

## 2024-06-12 ENCOUNTER — Ambulatory Visit: Payer: Self-pay | Admitting: Family Medicine

## 2025-02-24 ENCOUNTER — Ambulatory Visit
